# Patient Record
Sex: Female | Born: 1975 | Race: White | Hispanic: No | Smoking: Current every day smoker
Health system: Southern US, Community
[De-identification: ages and names within clinical notes are randomized; demographics above are authoritative.]

## PROBLEM LIST (undated history)

## (undated) DIAGNOSIS — F419 Anxiety disorder, unspecified: Secondary | ICD-10-CM

## (undated) DIAGNOSIS — G8929 Other chronic pain: Secondary | ICD-10-CM

## (undated) DIAGNOSIS — F17200 Nicotine dependence, unspecified, uncomplicated: Secondary | ICD-10-CM

## (undated) DIAGNOSIS — I10 Essential (primary) hypertension: Secondary | ICD-10-CM

## (undated) DIAGNOSIS — F319 Bipolar disorder, unspecified: Secondary | ICD-10-CM

## (undated) DIAGNOSIS — M549 Dorsalgia, unspecified: Secondary | ICD-10-CM

## (undated) DIAGNOSIS — M1990 Unspecified osteoarthritis, unspecified site: Secondary | ICD-10-CM

## (undated) DIAGNOSIS — M545 Low back pain, unspecified: Secondary | ICD-10-CM

## (undated) DIAGNOSIS — G4730 Sleep apnea, unspecified: Secondary | ICD-10-CM

## (undated) DIAGNOSIS — I517 Cardiomegaly: Secondary | ICD-10-CM

## (undated) DIAGNOSIS — J449 Chronic obstructive pulmonary disease, unspecified: Secondary | ICD-10-CM

## (undated) DIAGNOSIS — M797 Fibromyalgia: Secondary | ICD-10-CM

## (undated) DIAGNOSIS — E785 Hyperlipidemia, unspecified: Secondary | ICD-10-CM

## (undated) HISTORY — DX: Low back pain: M54.5

## (undated) HISTORY — DX: Nicotine dependence, unspecified, uncomplicated: F17.200

## (undated) HISTORY — DX: Low back pain, unspecified: M54.50

## (undated) HISTORY — DX: Bipolar disorder, unspecified: F31.9

## (undated) HISTORY — DX: Fibromyalgia: M79.7

## (undated) HISTORY — DX: Essential (primary) hypertension: I10

## (undated) HISTORY — DX: Cardiomegaly: I51.7

## (undated) HISTORY — PX: CARPAL TUNNEL RELEASE: SHX101

## (undated) HISTORY — DX: Anxiety disorder, unspecified: F41.9

## (undated) HISTORY — PX: TUBAL LIGATION: SHX77

## (undated) HISTORY — PX: NOSE SURGERY: SHX723

## (undated) HISTORY — PX: OTHER SURGICAL HISTORY: SHX169

## (undated) HISTORY — DX: Hyperlipidemia, unspecified: E78.5

---

## 1998-08-12 ENCOUNTER — Emergency Department (HOSPITAL_COMMUNITY): Admission: EM | Admit: 1998-08-12 | Discharge: 1998-08-12 | Payer: Self-pay | Admitting: Emergency Medicine

## 1999-03-05 ENCOUNTER — Other Ambulatory Visit: Admission: RE | Admit: 1999-03-05 | Discharge: 1999-03-05 | Payer: Self-pay | Admitting: Obstetrics and Gynecology

## 1999-04-26 ENCOUNTER — Ambulatory Visit (HOSPITAL_COMMUNITY): Admission: RE | Admit: 1999-04-26 | Discharge: 1999-04-26 | Payer: Self-pay | Admitting: Obstetrics and Gynecology

## 1999-04-26 ENCOUNTER — Encounter: Payer: Self-pay | Admitting: Obstetrics and Gynecology

## 1999-05-22 ENCOUNTER — Emergency Department (HOSPITAL_COMMUNITY): Admission: EM | Admit: 1999-05-22 | Discharge: 1999-05-22 | Payer: Self-pay | Admitting: Emergency Medicine

## 1999-05-27 ENCOUNTER — Ambulatory Visit (HOSPITAL_COMMUNITY): Admission: RE | Admit: 1999-05-27 | Discharge: 1999-05-27 | Payer: Self-pay | Admitting: Obstetrics and Gynecology

## 1999-06-28 ENCOUNTER — Inpatient Hospital Stay (HOSPITAL_COMMUNITY): Admission: AD | Admit: 1999-06-28 | Discharge: 1999-06-28 | Payer: Self-pay | Admitting: Obstetrics and Gynecology

## 1999-07-18 ENCOUNTER — Inpatient Hospital Stay (HOSPITAL_COMMUNITY): Admission: AD | Admit: 1999-07-18 | Discharge: 1999-07-18 | Payer: Self-pay | Admitting: Obstetrics and Gynecology

## 1999-08-31 ENCOUNTER — Inpatient Hospital Stay (HOSPITAL_COMMUNITY): Admission: AD | Admit: 1999-08-31 | Discharge: 1999-08-31 | Payer: Self-pay | Admitting: Obstetrics and Gynecology

## 1999-09-06 ENCOUNTER — Encounter: Payer: Self-pay | Admitting: Obstetrics and Gynecology

## 1999-09-06 ENCOUNTER — Ambulatory Visit (HOSPITAL_COMMUNITY): Admission: RE | Admit: 1999-09-06 | Discharge: 1999-09-06 | Payer: Self-pay | Admitting: Obstetrics and Gynecology

## 1999-09-14 ENCOUNTER — Inpatient Hospital Stay (HOSPITAL_COMMUNITY): Admission: AD | Admit: 1999-09-14 | Discharge: 1999-09-14 | Payer: Self-pay | Admitting: Obstetrics and Gynecology

## 1999-09-23 ENCOUNTER — Inpatient Hospital Stay (HOSPITAL_COMMUNITY): Admission: AD | Admit: 1999-09-23 | Discharge: 1999-09-26 | Payer: Self-pay | Admitting: Obstetrics and Gynecology

## 1999-12-23 ENCOUNTER — Encounter: Payer: Self-pay | Admitting: Emergency Medicine

## 1999-12-23 ENCOUNTER — Emergency Department (HOSPITAL_COMMUNITY): Admission: EM | Admit: 1999-12-23 | Discharge: 1999-12-23 | Payer: Self-pay | Admitting: Emergency Medicine

## 2000-01-01 ENCOUNTER — Emergency Department (HOSPITAL_COMMUNITY): Admission: EM | Admit: 2000-01-01 | Discharge: 2000-01-01 | Payer: Self-pay

## 2000-01-21 ENCOUNTER — Emergency Department (HOSPITAL_COMMUNITY): Admission: EM | Admit: 2000-01-21 | Discharge: 2000-01-21 | Payer: Self-pay | Admitting: Emergency Medicine

## 2000-01-22 ENCOUNTER — Emergency Department (HOSPITAL_COMMUNITY): Admission: EM | Admit: 2000-01-22 | Discharge: 2000-01-23 | Payer: Self-pay | Admitting: Emergency Medicine

## 2000-06-06 ENCOUNTER — Emergency Department (HOSPITAL_COMMUNITY): Admission: EM | Admit: 2000-06-06 | Discharge: 2000-06-06 | Payer: Self-pay | Admitting: Emergency Medicine

## 2000-06-18 ENCOUNTER — Emergency Department (HOSPITAL_COMMUNITY): Admission: EM | Admit: 2000-06-18 | Discharge: 2000-06-18 | Payer: Self-pay | Admitting: Emergency Medicine

## 2000-07-18 ENCOUNTER — Emergency Department (HOSPITAL_COMMUNITY): Admission: EM | Admit: 2000-07-18 | Discharge: 2000-07-18 | Payer: Self-pay | Admitting: Emergency Medicine

## 2000-08-31 ENCOUNTER — Emergency Department (HOSPITAL_COMMUNITY): Admission: EM | Admit: 2000-08-31 | Discharge: 2000-08-31 | Payer: Self-pay | Admitting: *Deleted

## 2000-10-24 ENCOUNTER — Emergency Department (HOSPITAL_COMMUNITY): Admission: EM | Admit: 2000-10-24 | Discharge: 2000-10-24 | Payer: Self-pay

## 2000-11-25 ENCOUNTER — Emergency Department (HOSPITAL_COMMUNITY): Admission: EM | Admit: 2000-11-25 | Discharge: 2000-11-25 | Payer: Self-pay | Admitting: *Deleted

## 2001-02-03 ENCOUNTER — Inpatient Hospital Stay (HOSPITAL_COMMUNITY): Admission: AD | Admit: 2001-02-03 | Discharge: 2001-02-03 | Payer: Self-pay | Admitting: *Deleted

## 2001-05-01 ENCOUNTER — Inpatient Hospital Stay (HOSPITAL_COMMUNITY): Admission: AD | Admit: 2001-05-01 | Discharge: 2001-05-01 | Payer: Self-pay | Admitting: Obstetrics and Gynecology

## 2001-06-12 ENCOUNTER — Emergency Department (HOSPITAL_COMMUNITY): Admission: EM | Admit: 2001-06-12 | Discharge: 2001-06-12 | Payer: Self-pay | Admitting: Emergency Medicine

## 2001-06-28 ENCOUNTER — Other Ambulatory Visit: Admission: RE | Admit: 2001-06-28 | Discharge: 2001-06-28 | Payer: Self-pay | Admitting: Obstetrics and Gynecology

## 2001-07-11 ENCOUNTER — Emergency Department (HOSPITAL_COMMUNITY): Admission: EM | Admit: 2001-07-11 | Discharge: 2001-07-12 | Payer: Self-pay | Admitting: *Deleted

## 2001-07-30 ENCOUNTER — Ambulatory Visit (HOSPITAL_COMMUNITY): Admission: RE | Admit: 2001-07-30 | Discharge: 2001-07-30 | Payer: Self-pay | Admitting: Obstetrics and Gynecology

## 2001-07-30 ENCOUNTER — Encounter: Payer: Self-pay | Admitting: Obstetrics and Gynecology

## 2001-10-08 ENCOUNTER — Inpatient Hospital Stay (HOSPITAL_COMMUNITY): Admission: AD | Admit: 2001-10-08 | Discharge: 2001-10-08 | Payer: Self-pay | Admitting: Obstetrics and Gynecology

## 2001-12-01 ENCOUNTER — Inpatient Hospital Stay (HOSPITAL_COMMUNITY): Admission: AD | Admit: 2001-12-01 | Discharge: 2001-12-01 | Payer: Self-pay | Admitting: Obstetrics and Gynecology

## 2001-12-19 ENCOUNTER — Encounter (INDEPENDENT_AMBULATORY_CARE_PROVIDER_SITE_OTHER): Payer: Self-pay

## 2001-12-19 ENCOUNTER — Inpatient Hospital Stay (HOSPITAL_COMMUNITY): Admission: AD | Admit: 2001-12-19 | Discharge: 2001-12-21 | Payer: Self-pay | Admitting: Obstetrics and Gynecology

## 2002-03-26 ENCOUNTER — Ambulatory Visit (HOSPITAL_BASED_OUTPATIENT_CLINIC_OR_DEPARTMENT_OTHER): Admission: RE | Admit: 2002-03-26 | Discharge: 2002-03-26 | Payer: Self-pay | Admitting: Family Medicine

## 2002-04-26 ENCOUNTER — Encounter: Admission: RE | Admit: 2002-04-26 | Discharge: 2002-04-26 | Payer: Self-pay | Admitting: Family Medicine

## 2002-04-26 ENCOUNTER — Encounter: Payer: Self-pay | Admitting: Family Medicine

## 2003-04-02 ENCOUNTER — Emergency Department (HOSPITAL_COMMUNITY): Admission: EM | Admit: 2003-04-02 | Discharge: 2003-04-02 | Payer: Self-pay | Admitting: Emergency Medicine

## 2003-04-02 ENCOUNTER — Encounter: Payer: Self-pay | Admitting: Emergency Medicine

## 2003-06-30 ENCOUNTER — Emergency Department (HOSPITAL_COMMUNITY): Admission: EM | Admit: 2003-06-30 | Discharge: 2003-06-30 | Payer: Self-pay | Admitting: Emergency Medicine

## 2003-09-07 ENCOUNTER — Encounter: Payer: Self-pay | Admitting: Emergency Medicine

## 2003-09-07 ENCOUNTER — Emergency Department (HOSPITAL_COMMUNITY): Admission: EM | Admit: 2003-09-07 | Discharge: 2003-09-07 | Payer: Self-pay

## 2003-12-07 ENCOUNTER — Emergency Department (HOSPITAL_COMMUNITY): Admission: EM | Admit: 2003-12-07 | Discharge: 2003-12-07 | Payer: Self-pay | Admitting: Emergency Medicine

## 2004-10-31 ENCOUNTER — Emergency Department: Payer: Self-pay | Admitting: Unknown Physician Specialty

## 2004-11-16 ENCOUNTER — Emergency Department: Payer: Self-pay | Admitting: Emergency Medicine

## 2005-01-09 ENCOUNTER — Emergency Department (HOSPITAL_COMMUNITY): Admission: EM | Admit: 2005-01-09 | Discharge: 2005-01-10 | Payer: Self-pay | Admitting: Emergency Medicine

## 2005-02-22 ENCOUNTER — Inpatient Hospital Stay (HOSPITAL_COMMUNITY): Admission: RE | Admit: 2005-02-22 | Discharge: 2005-02-26 | Payer: Self-pay | Admitting: Psychiatry

## 2005-02-22 ENCOUNTER — Ambulatory Visit: Payer: Self-pay | Admitting: Psychiatry

## 2005-04-12 ENCOUNTER — Emergency Department: Payer: Self-pay | Admitting: Emergency Medicine

## 2005-05-11 ENCOUNTER — Emergency Department: Payer: Self-pay | Admitting: Emergency Medicine

## 2005-06-01 ENCOUNTER — Ambulatory Visit: Payer: Self-pay | Admitting: Otolaryngology

## 2005-07-15 ENCOUNTER — Emergency Department: Payer: Self-pay | Admitting: General Practice

## 2005-12-17 ENCOUNTER — Emergency Department (HOSPITAL_COMMUNITY): Admission: EM | Admit: 2005-12-17 | Discharge: 2005-12-17 | Payer: Self-pay | Admitting: Emergency Medicine

## 2006-01-23 ENCOUNTER — Emergency Department (HOSPITAL_COMMUNITY): Admission: EM | Admit: 2006-01-23 | Discharge: 2006-01-23 | Payer: Self-pay | Admitting: Family Medicine

## 2006-01-24 ENCOUNTER — Ambulatory Visit: Payer: Self-pay | Admitting: Family Medicine

## 2006-02-01 ENCOUNTER — Ambulatory Visit: Payer: Self-pay | Admitting: Family Medicine

## 2006-03-13 ENCOUNTER — Emergency Department (HOSPITAL_COMMUNITY): Admission: EM | Admit: 2006-03-13 | Discharge: 2006-03-13 | Payer: Self-pay | Admitting: Emergency Medicine

## 2006-04-10 ENCOUNTER — Emergency Department: Payer: Self-pay | Admitting: Emergency Medicine

## 2006-05-17 ENCOUNTER — Emergency Department (HOSPITAL_COMMUNITY): Admission: EM | Admit: 2006-05-17 | Discharge: 2006-05-17 | Payer: Self-pay | Admitting: Family Medicine

## 2006-07-17 ENCOUNTER — Inpatient Hospital Stay: Payer: Self-pay | Admitting: Psychiatry

## 2006-08-06 ENCOUNTER — Emergency Department (HOSPITAL_COMMUNITY): Admission: EM | Admit: 2006-08-06 | Discharge: 2006-08-07 | Payer: Self-pay | Admitting: Emergency Medicine

## 2006-08-25 IMAGING — CR DG ANKLE COMPLETE 3+V*L*
1 series · 5 of 5 positions shown · non-contrast
Comparison: none

REASON FOR EXAM: Injury
COMMENTS:

[Series 1: view not recorded · 0.17mm/px · 5 of 5 slices shown]
[im 1/5]
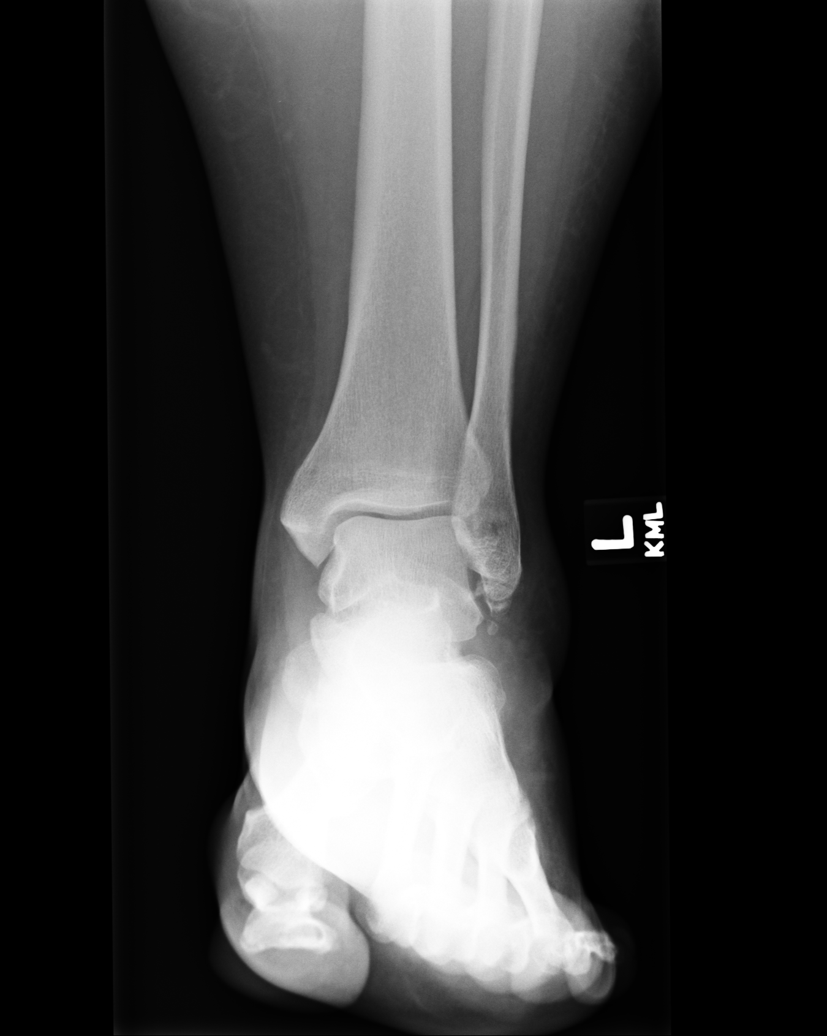
[im 2/5]
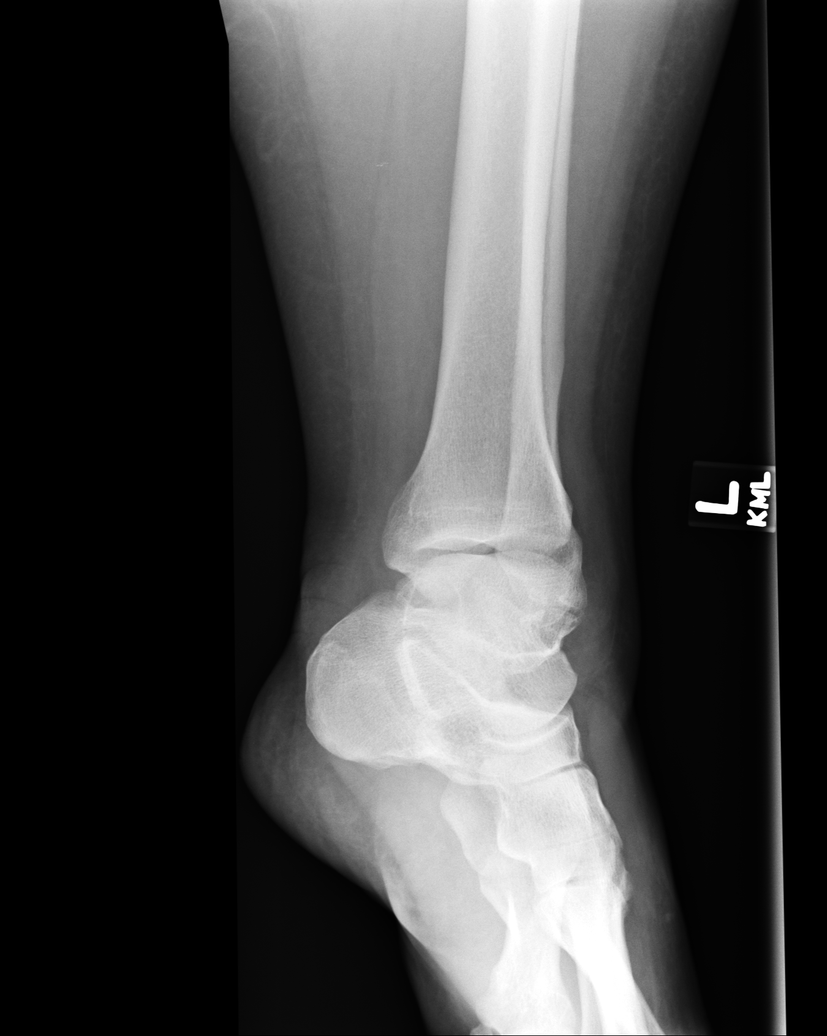
[im 3/5]
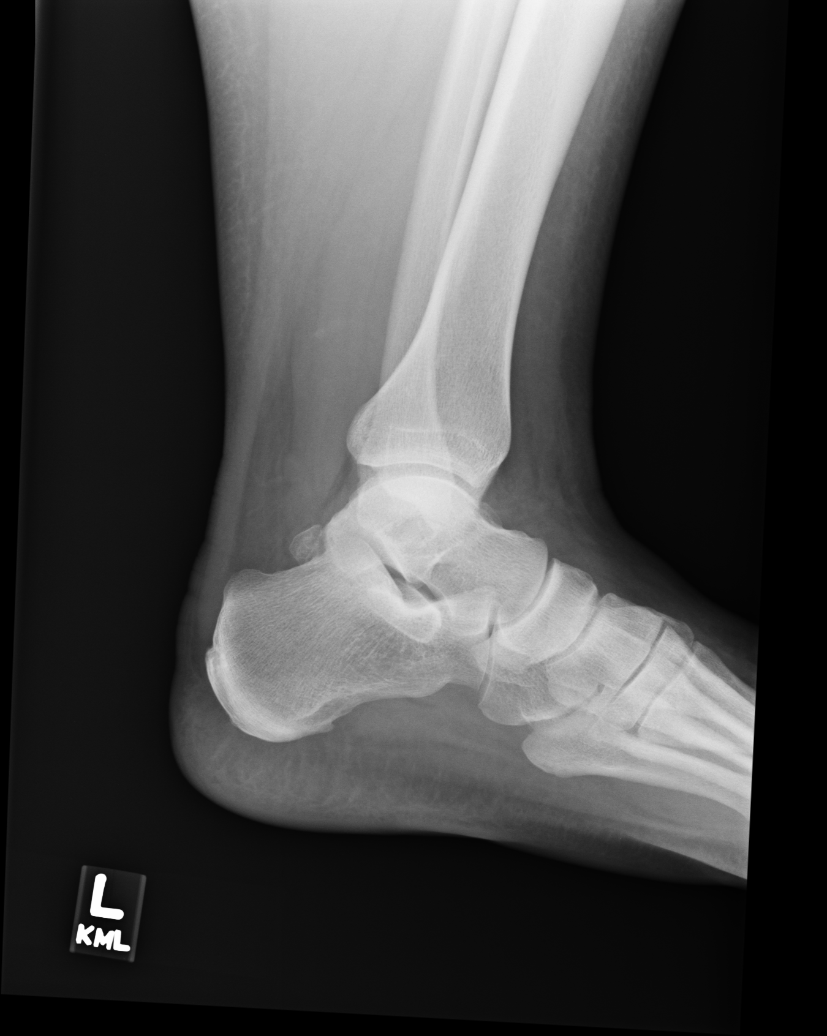
[im 4/5]
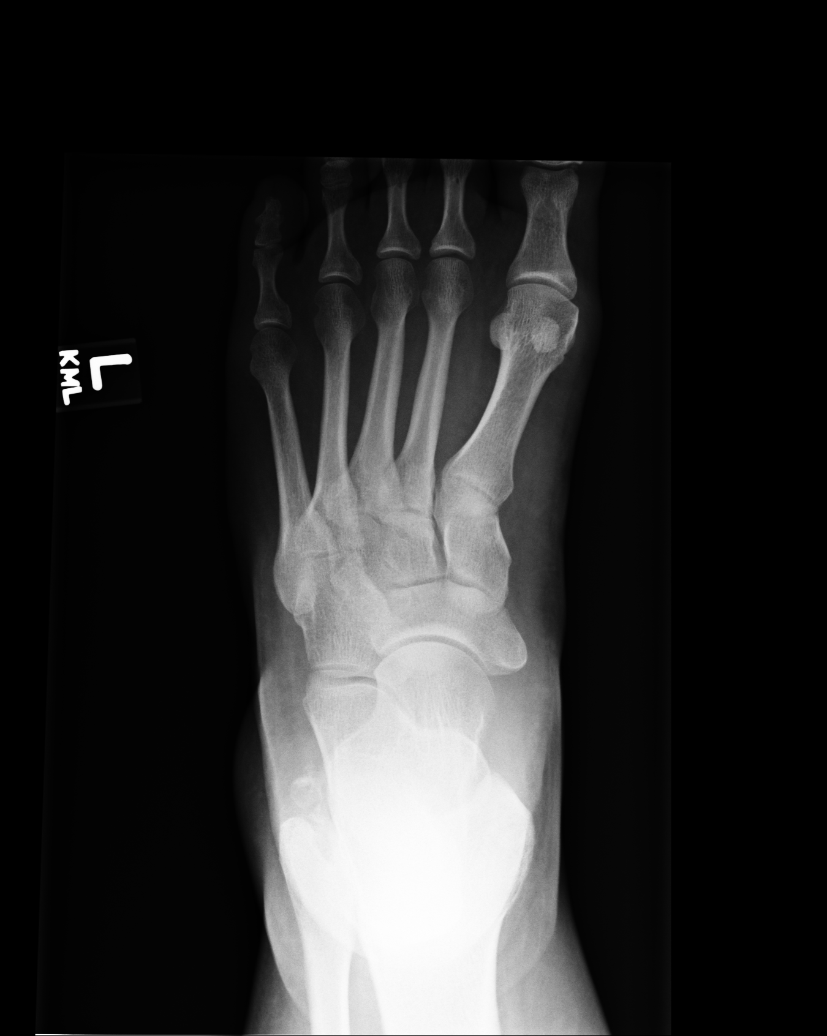
[im 5/5]
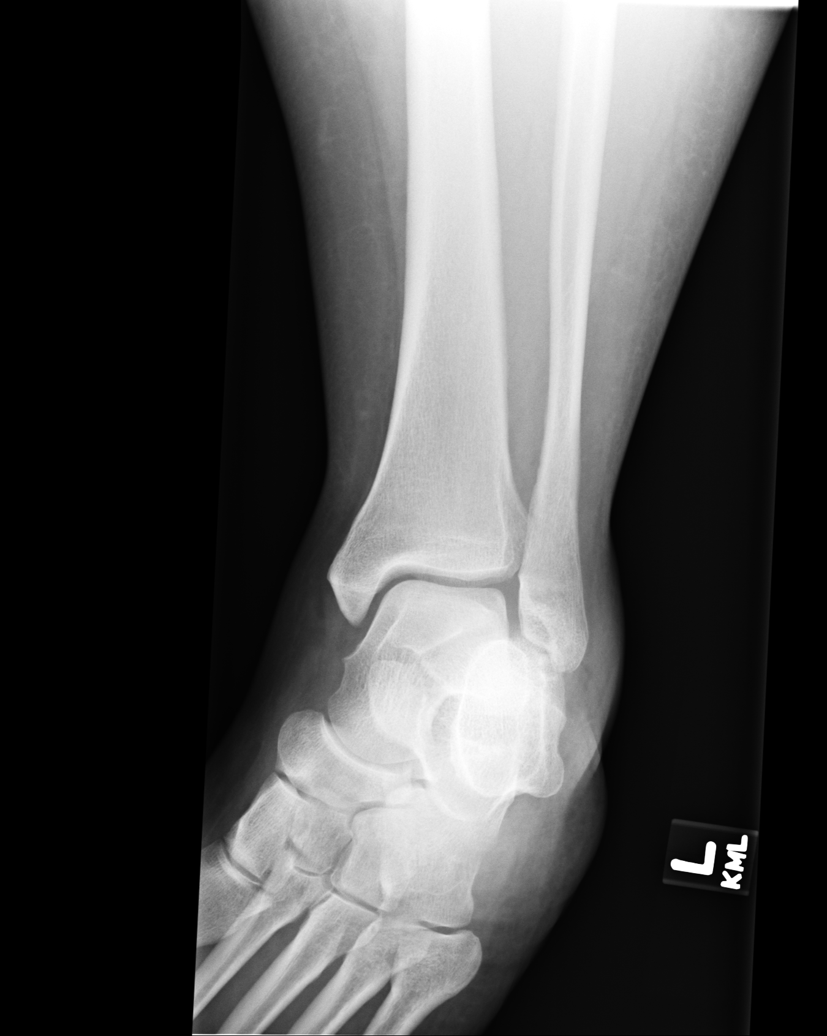

[5 of 5 positions shown; findings below may reference images not displayed]

PROCEDURE:     DXR - DXR ANKLE LEFT COMPLETE  - April 10, 2006  [DATE]

RESULT:     Five views of the LEFT ankle were obtained.

The current exam is compared to a prior exam of 07/15/2005.

There are noted osseous densities inferior to the tip of the lateral
malleolus compatible with sesamoid bones. These have been present
previously. No acute fracture is seen. The ankle mortise is well maintained.
IMPRESSION: No acute bony abnormalities are identified.

## 2007-04-08 ENCOUNTER — Emergency Department (HOSPITAL_COMMUNITY): Admission: EM | Admit: 2007-04-08 | Discharge: 2007-04-08 | Payer: Self-pay | Admitting: Emergency Medicine

## 2007-08-25 ENCOUNTER — Emergency Department (HOSPITAL_COMMUNITY): Admission: EM | Admit: 2007-08-25 | Discharge: 2007-08-25 | Payer: Self-pay | Admitting: Emergency Medicine

## 2007-08-30 ENCOUNTER — Emergency Department (HOSPITAL_COMMUNITY): Admission: EM | Admit: 2007-08-30 | Discharge: 2007-08-30 | Payer: Self-pay | Admitting: Emergency Medicine

## 2007-09-04 ENCOUNTER — Inpatient Hospital Stay (HOSPITAL_COMMUNITY): Admission: AD | Admit: 2007-09-04 | Discharge: 2007-09-06 | Payer: Self-pay | Admitting: Psychiatry

## 2007-09-04 ENCOUNTER — Emergency Department: Payer: Self-pay | Admitting: Unknown Physician Specialty

## 2007-09-05 ENCOUNTER — Ambulatory Visit: Payer: Self-pay | Admitting: *Deleted

## 2007-10-09 ENCOUNTER — Emergency Department (HOSPITAL_COMMUNITY): Admission: EM | Admit: 2007-10-09 | Discharge: 2007-10-09 | Payer: Self-pay | Admitting: Emergency Medicine

## 2008-06-03 ENCOUNTER — Emergency Department (HOSPITAL_COMMUNITY): Admission: EM | Admit: 2008-06-03 | Discharge: 2008-06-03 | Payer: Self-pay | Admitting: Family Medicine

## 2008-06-05 ENCOUNTER — Ambulatory Visit: Payer: Self-pay | Admitting: Orthopedic Surgery

## 2008-07-15 ENCOUNTER — Ambulatory Visit: Payer: Self-pay | Admitting: Orthopedic Surgery

## 2008-10-29 ENCOUNTER — Emergency Department (HOSPITAL_COMMUNITY): Admission: EM | Admit: 2008-10-29 | Discharge: 2008-10-29 | Payer: Self-pay | Admitting: Emergency Medicine

## 2009-04-09 ENCOUNTER — Ambulatory Visit (HOSPITAL_BASED_OUTPATIENT_CLINIC_OR_DEPARTMENT_OTHER): Admission: RE | Admit: 2009-04-09 | Discharge: 2009-04-09 | Payer: Self-pay | Admitting: Orthopedic Surgery

## 2009-06-13 ENCOUNTER — Other Ambulatory Visit: Payer: Self-pay | Admitting: Emergency Medicine

## 2009-06-13 ENCOUNTER — Inpatient Hospital Stay (HOSPITAL_COMMUNITY): Admission: AD | Admit: 2009-06-13 | Discharge: 2009-06-16 | Payer: Self-pay | Admitting: Psychiatry

## 2009-06-15 ENCOUNTER — Ambulatory Visit: Payer: Self-pay | Admitting: Psychiatry

## 2009-09-15 ENCOUNTER — Encounter: Admission: RE | Admit: 2009-09-15 | Discharge: 2009-10-07 | Payer: Self-pay | Admitting: Anesthesiology

## 2010-03-24 ENCOUNTER — Encounter: Payer: Self-pay | Admitting: Cardiovascular Disease

## 2010-07-07 ENCOUNTER — Encounter: Payer: Self-pay | Admitting: Cardiovascular Disease

## 2010-08-09 ENCOUNTER — Encounter: Payer: Self-pay | Admitting: Cardiovascular Disease

## 2010-08-09 ENCOUNTER — Ambulatory Visit: Payer: Self-pay | Admitting: Cardiology

## 2010-08-09 DIAGNOSIS — J449 Chronic obstructive pulmonary disease, unspecified: Secondary | ICD-10-CM | POA: Insufficient documentation

## 2010-08-09 DIAGNOSIS — Z8679 Personal history of other diseases of the circulatory system: Secondary | ICD-10-CM | POA: Insufficient documentation

## 2010-08-09 DIAGNOSIS — E785 Hyperlipidemia, unspecified: Secondary | ICD-10-CM | POA: Insufficient documentation

## 2010-08-09 DIAGNOSIS — R0602 Shortness of breath: Secondary | ICD-10-CM | POA: Insufficient documentation

## 2010-08-31 ENCOUNTER — Encounter: Payer: Self-pay | Admitting: Cardiology

## 2010-09-07 ENCOUNTER — Encounter: Payer: Self-pay | Admitting: Internal Medicine

## 2010-09-07 ENCOUNTER — Ambulatory Visit: Payer: Self-pay | Admitting: Internal Medicine

## 2010-09-07 DIAGNOSIS — R05 Cough: Secondary | ICD-10-CM

## 2010-09-07 DIAGNOSIS — F17200 Nicotine dependence, unspecified, uncomplicated: Secondary | ICD-10-CM | POA: Insufficient documentation

## 2010-09-07 DIAGNOSIS — G4730 Sleep apnea, unspecified: Secondary | ICD-10-CM | POA: Insufficient documentation

## 2010-09-07 DIAGNOSIS — R059 Cough, unspecified: Secondary | ICD-10-CM | POA: Insufficient documentation

## 2010-09-13 ENCOUNTER — Ambulatory Visit: Payer: Self-pay | Admitting: Cardiology

## 2010-09-13 ENCOUNTER — Ambulatory Visit: Payer: Self-pay

## 2010-09-13 ENCOUNTER — Ambulatory Visit (HOSPITAL_COMMUNITY): Admission: RE | Admit: 2010-09-13 | Discharge: 2010-09-13 | Payer: Self-pay | Admitting: Cardiology

## 2010-09-13 ENCOUNTER — Encounter: Payer: Self-pay | Admitting: Cardiology

## 2010-10-06 ENCOUNTER — Telehealth: Payer: Self-pay | Admitting: Internal Medicine

## 2010-10-20 ENCOUNTER — Telehealth: Payer: Self-pay | Admitting: Internal Medicine

## 2010-10-26 ENCOUNTER — Telehealth: Payer: Self-pay | Admitting: Internal Medicine

## 2011-01-11 NOTE — Progress Notes (Signed)
Summary: nos appt  Phone Note Call from Patient   Caller: [email or web address]  Call For: ramaswamy Summary of Call: ATC pt to rsc nos from 11/8, phone disconnected. Initial call taken by: Darletta Moll,  October 20, 2010 3:53 PM     Appended Document: nos appt send letter

## 2011-01-11 NOTE — Letter (Signed)
Summary: [place] [employer] [facility], agricultural at [address]   [place], [place] [address]   Phone: [phone]  Fax: [phone]      August 31, 2010 MRN: [id number]   Alyssa [name] [address] [place], [place]  [address]   Dear [name],  Your test ordered by Selena Batten has been reviewed by your physician (or physician assistant) and was found to be normal or stable. Your physician (or physician assistant) felt no changes were needed at this time.  ____ Echocardiogram  ____ Cardiac Stress Test  __x__ Lab Work  ____ Peripheral vascular study of arms, legs or neck  ____ CT scan or X-ray  ____ Lung or Breathing test  ____ Other:   Thank you.   Benedict Needy, RN    McCracken Bing, MD, F.A.C.Gaylord Shih, MD, F.A.C.C Lewayne Bunting, MD, F.A.C.C Nona Dell, MD, F.A.C.C Charlton Haws, MD, Lenise Arena.C.C

## 2011-01-11 NOTE — Letter (Signed)
Summary: PHI  PHI   Imported By: Harlon Flor 08/23/2010 15:55:41  _____________________________________________________________________  External Attachment:    Type:   Image     Comment:   External Document

## 2011-01-11 NOTE — Assessment & Plan Note (Signed)
Summary: asthma/apc   Visit Type:  Initial Consult Copy to:  Dr. Shirlee Latch Primary Provider/Referring Provider:  Chyrl Civatte Niemeyer,M.D.  CC:  Pulmonary COnsult. Marland Kitchen  History of Present Illness: IOV 09/07/2010: 35 year old obese female, 23 pack smoker, chronic back pain, OSA on CPAP since 2009 and Depression, Ex-coccaine - quit 10/03/2009  . Main c/o dyspnea. Insidious onset 3-4 years ago. PRogressive since onset. Currently, becomes dyspneic walking laps in office, or changing clothes,  taking a shower, climbing half flight of stairs makes her dyspneic. Needs to take breaks during housework. Dyspnea improved by resting. Dyspnea is associated with diaphoresis, tachycardic/palpitations. Dyspnea and back pain restricting her life and limiting ability to exercise and lose weight.   Associated cough wiht white, green-brown moderate amount of sputum present for 2-3 years. Cough is made worse with activity like house work. Cough also improved by resting and drinking water. Cough is also associated with occassional light headedness and near syncope. Cough is associated with ACE inhibitor intake for past 5months; she does note worsening of cough after starting ACE inhibitor. Cough also associad with GERD and post nasal drainage.   Also has random chest pains, sharp, with radiation to back in upper left sternal area. These are transient and fleeting. No specific aggravating or relieving factors   Per hx PMD diagnosed with "COPD" 3 years ago based on symptoms and cxr and spirometry per her hx. Started on spiriva, and advair and performist.  Also, diagnosed with OSA 2 years ago by PMD and started on CPAP. But these have not helped despite understanding aand compliance with meds. Perforomist as needed  use is atleast two times a day. Per Dr. Shirlee Latch cardioligy notes, ue to symptoms, also had ECHO in June/July 2011 and was noted to have moderate pericardial effusion although this result or study is not formally  available at this point.   Per [facility] records: ECG: NSR, normal. Labs (4/11): TSH normal, K 4.2, creatinine 0.7, LDL 102, HDL 37  Normal BNP 12 on 9/12    Preventive Screening-Counseling & Management  Alcohol-Tobacco     Smoking Status: current     Smoking Cessation Counseling: yes     Smoke Cessation Stage: ready     Packs/Day: 1.0     Year Started: 1988     Pack years: 23     Passive Smoke Exposure: yes     Tobacco Counseling: to quit use of tobacco products     Passive Smoke Counseling: to avoid passive smoke exposure  Caffeine-Diet-Exercise     Caffeine use/day: 1     Does Patient Exercise: no  Comments: mom smokes in the house. Has tried to quit with chnatix in 2010 but became more depressed. Currently due to start wellbutrin in Oct 2011 0035 Current Medications (verified): 1)  Advair Diskus 250-50 Mcg/dose Aepb (Fluticasone-Salmeterol) .... Two Sprays Two Times A Day 2)  Spiriva Handihaler 18 Mcg Caps (Tiotropium Bromide Monohydrate) .... One Tablet Once Daily 3)  Phentermine Hcl 37.5 Mg Tabs (Phentermine Hcl) .... One Tablet Once Daily 4)  Lisinopril-Hydrochlorothiazide 10-12.5 Mg Tabs (Lisinopril-Hydrochlorothiazide) .... One Tablet Once Daily 5)  Hydrocodone-Acetaminophen 10-325 Mg Tabs (Hydrocodone-Acetaminophen) .... Take 1 Tablet By Mouth Three Times A Day 6)  Naproxen 375 Mg Tabs (Naproxen) .... One Tablet At Bedtime 7)  Albuterol Sulfate (2.5 Mg/28ml) 0.083% Nebu (Albuterol Sulfate) .... Every Four Hours As Needed 8)  Nexium 40 Mg Cpdr (Esomeprazole Magnesium) .... One Tablet Once Daily 9)  Oxygen 2 Liters .... At Bedtime  10)  Cpap .Marland Kitchen.. 4 11)  Vitamin D3 5000 Unit Caps (Cholecalciferol) .... Take 1 Tablet By Mouth Once A Day 12)  Wellbutrin Xl 150 Mg Xr24h-Tab (Bupropion Hcl) .... Take 1 Tablet By Mouth Once A Day 13)  Cymbalta 60 Mg Cpep (Duloxetine Hcl) .... Take 1 Capsule By Mouth Once A Day  Allergies (verified): No Known Drug Allergies  Family  History: Family History of Coronary Artery Disease:  Mother with MI age 89 and valvular disease, emphysema Father with MI's first at age 75 and valvular disease., emphysema  Social History: Single, lives in [place] with boyfriend and 3 boys Homemaker Tobacco Use - started [id number], avg 1ppd.  Alcohol Use - no Regular Exercise - no Drug Use - used to use cocaine, last use in 10/10 Smoking Status:  current Packs/Day:  1.0 Pack years:  23 Passive Smoke Exposure:  yes Caffeine use/day:  1  Review of Systems       The patient complains of shortness of breath with activity, shortness of breath at rest, productive cough, coughing up blood, chest pain, irregular heartbeats, acid heartburn, indigestion, weight change, abdominal pain, difficulty swallowing, sore throat, tooth/dental problems, headaches, nasal congestion/difficulty breathing through nose, sneezing, itching, anxiety, depression, hand/feet swelling, joint stiffness or pain, rash, and change in color of mucus.  The patient denies non-productive cough, loss of appetite, ear ache, and fever.    Vital Signs:  Patient profile:   35 year old female Height:      67 inches Weight:      307.13 pounds BMI:     48.28 O2 Sat:      98 % on Room air Temp:     97.6 degrees F oral Pulse rate:   91 / minute BP sitting:   126 / 78  (right arm) Cuff size:   large  Vitals Entered By: Carron Curie CMA (September 07, 2010 9:23 AM)  O2 Flow:  Room air CC: Pulmonary COnsult.  Comments Medications reviewed with patient. Carron Curie CMA  September 07, 2010 9:32 AM Daytime phone number verified with patient.    Physical Exam  General:  Well developed, well nourished, in no acute distress.  Obese.  Head:  normocephalic and atraumatic Nose:  no deformity, discharge, inflammation, or lesions Mouth:  Teeth, gums and palate normal. Oral mucosa normal. Neck:  Neck supple, no JVD. No masses, thyromegaly or abnormal cervical  nodes. Lungs:  Bilateral expiratory wheezes no disgtress Heart:  Non-displaced PMI, chest non-tender; regular rate and rhythm, S1, S2 without murmurs, rubs or gallops. Carotid upstroke normal, no bruit. Pedals normal pulses. No edema, no varicosities. Abdomen:  Bowel sounds positive; abdomen soft and non-tender without masses, organomegaly, or hernias noted. No hepatosplenomegaly. Msk:  Back normal, normal gait. Muscle strength and tone normal. Extremities:  No clubbing or cyanosis. Neurologic:  Alert and oriented x 3.normal CN II-XII, gait normal, and strength normal.   Skin:  Intact without lesions or rashes. Psych:  Normal affect.   MISC. Report  Procedure date:  08/23/2010  Findings:      ECG: NSR, normal  Labs (4/11): TSH normal, K 4.2, creatinine 0.7, LDL 102, HDL 37  BNP 12 on 08/23/2010   Comments:      per [facility] records  Pulmonary Function Test Date: 09/07/2010 Gender: Female  Pre-Spirometry FEV1    Value: 2.3L L     % Pred: 67.6% %  Evaluation: moderate obstruction with NO significant bronchodilator response  Impression & Recommendations:  Problem #  1:  SHORTNESS OF BREATH (SOB) (ICD-786.05) Assessment New  Likely multifactorial due to COPD NOS (based on family hx, smoking and prior dx and med list), obesity, dconditioning +/- pericardial effusion.   PLAN Get OLD Pfts from PMD Await completeion of echo by Dr. Shirlee Latch Based on above, decide on therapy alteration or further testing Alpha 1 AT check at followup  Orders: Consultation Level V [phone]) Prescription Created Electronically [phone])  Problem # 2:  COUGH (ICD-786.2) Assessment: New  Again multifactoria. She has active wheeze in office. Spirometry in office with her actively wheezing shows Fev1 2.3L/68% while on spiriva and advair. She is having mild AECOPD in need of putpatint Rx. .Other  Etiologies for chronic cough are smoking, copd, ACE inhibitor use, post nasal drip and GERD. She has all  causes of chronic cough.   PLAN CHANGE ACE inhibitor to ARB Continue PPI for GERD GERD dietary booklet given USe netti pot saline wash - pamphlet given Continue spiriva and advair for now Take steroid taper and antibiotic ROV in 4 weeks with pft result from outside to be made available  Orders: Consultation Level V (09811) Prescription Created Electronically [phone])  Problem # 3:  TOBACCO ABUSE (ICD-305.1) Assessment: Unchanged  failed chantix in past. Going to try wellbutrin that Dr. Shirlee Latch prescribed. Counselled for 5 minutes on hazards of smoking. She understands and will try to quit  Orders: Consultation Level V [phone]) Tobacco use cessation intermediate 3-10 minutes (13086)  Problem # 4:  SLEEP APNEA (ICD-780.57) Assessment: Comment Only  will refer to our sleep docs at some point in future followup  Orders: Consultation Level V (57846)  Medications Added to Medication List This Visit: 1)  Hydrocodone-acetaminophen 10-325 Mg Tabs (Hydrocodone-acetaminophen) .... Take 1 tablet by mouth three times a day 2)  Cpap  .Marland Kitchen.. 4 3)  Vitamin D3 5000 Unit Caps (Cholecalciferol) .... Take 1 tablet by mouth once a day 4)  Cymbalta 60 Mg Cpep (Duloxetine hcl) .... Take 1 capsule by mouth once a day 5)  Benicar Hct 20-12.5 Mg Tabs (Olmesartan medoxomil-hctz) .... One tablet by mouth daily 6)  Prednisone 10 Mg Tabs (Prednisone) .... 4 tablets daily x3 days, then 3 tablets daily x 3 days, then 2 tablets daily x3 days, then 1 tablet daily x3 days, then stop 7)  Doxycycline Monohydrate 100 Mg Caps (Doxycycline monohydrate) .... By mouth twice daily after meals  Patient Instructions: 1)  #COUGH 2)   - you are in copd attack now 3)   - take antibiotic and prednisone as directed 4)   - use netti pot saline wash daily - get pamphlet 5)   - control acid reflux - get dietary advice sheet  6)  - stop ace inhibitor 7)   - take sample of benicar-hctz 1 tablet daily 8)   - measure your bp  regularly at a mall- if upper reading >150 call us 9)  #SHORTNESS OF BREATH 10)   - get all your old breathing test from Dr Vance Peper office 11)   -sign release for it 12)   - we will wait for echo result from Dr. Shirlee Latch 13)  #OLD MEDS 14)   - continue your other medications as before 15)   - just the lisinopril you are changing to the sample benicar 16)  #FOLLOWUP 17)   - flu shot next visit 18)   - alpha 1 antitrypsin chek next visit 19)   - spirometrey repeat next visit 20)   - return in 4  weeks Prescriptions: DOXYCYCLINE MONOHYDRATE 100 MG  CAPS (DOXYCYCLINE MONOHYDRATE) By mouth twice daily after meals  #10 x 0   Entered and Authorized by:   Kalman Shan MD   Signed by:   Kalman Shan MD on 09/07/2010   Method used:   Electron[facility] to        [facility] (retail)       [name]       [place], [place]  [address]       Ph: [phone] or [phone]       Fax: [phone]   RxID:   [id number] PREDNISONE 10 MG  TABS (PREDNISONE) 4 tablets daily x3 days, then 3 tablets daily x 3 days, then 2 tablets daily x3 days, then 1 tablet daily x3 days, then stop  #30 x 0   Entered and Authorized by:   Kalman Shan MD   Signed by:   Kalman Shan MD on 09/07/2010   Method used:   [facility]        [id number]       [address]       [place], [place]  [address]       Ph: [phone] or [phone]       Fax: [phone]   RxID:   [id number]    Immunization History:  Influenza Immunization History:    Influenza:  fluvax 3+ (08/17/2010)  Pneumovax Immunization History:    Pneumovax:  never (08/12/2010)   Appended Document: asthma/apc Ambulatory Pulse Oximetry  Resting; HR_104____    02 Sat_96____  Lap1 (185 feet)   HR_136____   02 Sat___94__ Lap2 (185 feet)   HR_136____   02 Sat__87___  on 2nd lap pt sats dropped but they recovered immediately without oxygen  Lap3 (185 feet)   HR___141__   02  Sat_94____  _x__Test Completed without Difficulty ___Test Stopped due to:

## 2011-01-11 NOTE — Progress Notes (Signed)
Summary: letter mailed  Phone Note Call from Patient   Caller: [email or web address]  Call For: ramaswamy Summary of Call: Letter sent. Initial call taken by: Darletta Moll,  October 26, 2010 8:41 AM

## 2011-01-11 NOTE — Progress Notes (Signed)
Summary: nos appt  Phone Note Call from Patient   Caller: [email or web address]  Call For: [email or web address] Summary of Call: Rsc nos from 10/25 to 11/8. Initial call taken by: Darletta Moll,  October 06, 2010 9:57 AM

## 2011-01-11 NOTE — Assessment & Plan Note (Signed)
Summary: NEW PT   Visit Type:  Initial Consult Primary Provider:  Meindert Niemeyer,M.D.  CC:  c/o rapid heart beats and shortness of breath.  Echo(05/2010) revealed  EF of 52% with mild LVH and mild MR.Marland Kitchen  History of Present Illness: 35 yo with history of asthma, exertional dyspnea, and pericardial effusion seen on recent echocardiogram presents for cardiac evaluation.  Patient has had significant exertional shortness of breath for the last 3 years.  She is dyspneic just walking around her house.  It is very hard for her to climb stairs.  She wheezes frequently.  She is an active smoker but is down to 1/2 ppd now (prior 1.5 ppd).  She gets chest tightness when she wheezes.  No orthopnea or PND.  She had an echocardiogram at Dr. Carron Brazen office in 7/11 showing normal LV systolic function with no significant valvular dysfunction.  A moderate pericardial effusion was noted .  Finally, she had an episode several weeks ago of numbness/tingling on the left side of her body that lasted for more than a day.  She had carotid dopplers done after this that apparently were unremarkable.   ECG: NSR, normal  Labs (4/11): TSH normal, K 4.2, creatinine 0.7, LDL 102, HDL 37  Preventive Screening-Counseling & Management  Alcohol-Tobacco     Smoking Status: never  Caffeine-Diet-Exercise     Does Patient Exercise: no      Drug Use:  no.    Current Medications (verified): 1)  Advair Diskus 250-50 Mcg/dose Aepb (Fluticasone-Salmeterol) .... Two Sprays Two Times A Day 2)  Spiriva Handihaler 18 Mcg Caps (Tiotropium Bromide Monohydrate) .... One Tablet Once Daily 3)  Phentermine Hcl 37.5 Mg Tabs (Phentermine Hcl) .... One Tablet Once Daily 4)  Lisinopril-Hydrochlorothiazide 10-12.5 Mg Tabs (Lisinopril-Hydrochlorothiazide) .... One Tablet Once Daily 5)  Hydrocodone-Acetaminophen 5-325 Mg Tabs (Hydrocodone-Acetaminophen) .... One Tablet Three Times A Day 6)  Naproxen 375 Mg Tabs (Naproxen) .... One Tablet At  Bedtime 7)  Albuterol Sulfate (2.5 Mg/75ml) 0.083% Nebu (Albuterol Sulfate) .... Every Four Hours As Needed 8)  Nexium 40 Mg Cpdr (Esomeprazole Magnesium) .... One Tablet Once Daily 9)  Oxygen 2 Liters .... At Bedtime 10)  Cpap 11)  Vitamin D3 5000 .... One Tablet Once Daily  Allergies (verified): No Known Drug Allergies  Past History:  Family History: Last updated: 08/09/2010 Family History of Coronary Artery Disease:  Mother with MI age 28 and valvular disease Father with MI's first at age 71 and valvular disease.  Social History: Last updated: 08/09/2010 Single, lives in [place] Tobacco Use - Smokes 0.5 ppd (heavier prior) Alcohol Use - no Regular Exercise - no Drug Use - used to use cocaine, last use in 6/10  Risk Factors: Exercise: no (08/09/2010)  Risk Factors: Smoking Status: never (08/09/2010)  Past Medical History: 1.  Echo (7/11): EF 70%, mild LVH, no significant valvular disease, moderate pericardial effusion.  2.  Asthma 3.  anxiety 4.  fibromyalgia 5.  Bipolar disorder 6.  Hypertension 7.  Hyperlipidemia 8.  osteoporosis 9.  Low back pain: two ruptured disc in lower back 10.  Active smoker 11.  Prior cocaine abuse  Past Surgical History: C-section x 3 Bilateral hand surgery Tubligation  Family History: Family History of Coronary Artery Disease:  Mother with MI age 94 and valvular disease Father with MI's first at age 70 and valvular disease.  Social History: Single, lives in [place] Tobacco Use - Smokes 0.5 ppd (heavier prior) Alcohol Use - no Regular Exercise -  no Drug Use - used to use cocaine, last use in 6/10 Smoking Status:  never Does Patient Exercise:  no Drug Use:  no  Review of Systems       All systems reviewed and negative except as per HPI.   Vital Signs:  Patient profile:   35 year old female Height:      67 inches Weight:      297 pounds BMI:     46.68 Pulse rate:   94 / minute BP sitting:   142 / 87  (left  arm) Cuff size:   large  Vitals Entered By: Bishop Dublin, CMA (August 09, 2010 3:34 PM)  Physical Exam  General:  Well developed, well nourished, in no acute distress.  Obese.  Head:  normocephalic and atraumatic Nose:  no deformity, discharge, inflammation, or lesions Mouth:  Teeth, gums and palate normal. Oral mucosa normal. Neck:  Neck supple, no JVD. No masses, thyromegaly or abnormal cervical nodes. Lungs:  Bilateral expiratory wheezes Heart:  Non-displaced PMI, chest non-tender; regular rate and rhythm, S1, S2 without murmurs, rubs or gallops. Carotid upstroke normal, no bruit. Pedals normal pulses. No edema, no varicosities. Abdomen:  Bowel sounds positive; abdomen soft and non-tender without masses, organomegaly, or hernias noted. No hepatosplenomegaly. Msk:  Back normal, normal gait. Muscle strength and tone normal. Extremities:  No clubbing or cyanosis. Neurologic:  Alert and oriented x 3. Skin:  Intact without lesions or rashes. Psych:  Normal affect.   Impression & Recommendations:  Problem # 1:  SHORTNESS OF BREATH (ICD-786.05) Patient is actively wheezing today.  She says that she wheezes frequently and carries a diagnosis of asthma.  She has quite significant exertional dyspnea.  She uses Advair and Spiriva, with oxygen at night.  I suspect that her symptoms are mostly pulmonary.  She is not volume overloaded on exam.  I will get a BNP and a repeat echo to reassess the pericardial effusion seen on the previous echo.  She needs to quit smoking.  I think that she would benefit from pulmonary evaluation and will refer her to see a pulmonologist to aid with asthma management.    Problem # 2:  PERICARDIAL EFFUSION A "moderate" pericardial effusion was noted on echo report from 7/11.  I do not have the actual images to review.  She does not have any condition associated with the development of a significant pericardial effusion.  I am going to repeat an echo to reassess, as  above.   Problem # 3:  SMOKING I strongly encouraged the patient to stop smoking.  Chantix made her depression worse.  I will given her a wellbutrin prescription.    Problem # 4:  HYPERTENSION (ICD-401.9) BP borderline elevated.  Continue current med for now.   Other Orders: EKG w/ Interpretation (93000) T-BNP  (B Natriuretic Peptide) (04540-98119) Echocardiogram (Echo) Pulmonary Referral (Pulmonary)  Patient Instructions: 1)  Your physician recommends that you schedule a follow-up appointment in: 1 month  2)  Your physician recommends that you have lab work today (BNP) 3)  Your physician has recommended you make the following change in your medication: START wellbutrin 150mg  two times a day for 3 days then only once per day.  4)  You have been referred to [facility] Pulmonology appointment Monday Sept 12 @ 3:30 5)  Your physician has requested that you have an echocardiogram.  Echocardiography is a painless test that uses sound waves to create images of your heart. It provides your doctor with  information about the size and shape of your heart and how well your heart's chambers and valves are working.  This procedure takes approximately one hour. There are no restrictions for this procedure. Prescriptions: WELLBUTRIN XL 150 MG XR24H-TAB (BUPROPION HCL) Take 1 tablet by mouth once a day  #30 x 4   Entered by:   Benedict Needy, RN   Authorized by:   Marca Ancona, MD   Signed by:   Benedict Needy, RN on 08/09/2010   Method used:   [id number]       [address]       [place], [place]  [address]       Ph: [phone] or [phone]       Fax: [phone]   RxID:   [id number]   Appended Document: Orders Update    Clinical Lists Changes  Orders: Added new Test order of TLB-BNP (B-Natriuretic Peptide) (83880-BNPR) - Signed

## 2011-01-11 NOTE — Assessment & Plan Note (Signed)
Summary: f16m per check out/lg   Visit Type:  1 month follow up Referring Provider:  Dr. Shirlee Latch Primary Provider:  Meindert Niemeyer,M.D.  CC:  Rapid heart beat.  History of Present Illness: 35 yo with history of asthma, exertional dyspnea, and pericardial effusion seen on recent echocardiogram presents for cardiac evaluation.  Patient has had significant exertional shortness of breath for the last 3 years.  She is dyspneic walking around her house at times.  It is very hard for her to climb stairs.  She wheezes frequently.  She is an active smoker but is down to 1/2 ppd now (prior 1.5 ppd).  She gets chest tightness when she wheezes.  No orthopnea or PND.  She had an echocardiogram at Dr. Carron Brazen office in 7/11 showing normal LV systolic function with no significant valvular dysfunction.  A moderate pericardial effusion was noted .    Echo to reassess for pericardial effusion showed no effusion at all.  EF was normal, RV appeared normal, there was no pulmonary HTN.  She has been seeing Dr. Marchelle Gearing for asthma management.  Her cough has resolved off ACEI.  She still is having wheezing fairly frequently.   ECG: NSR, normal, HR 51  Labs (4/11): TSH normal, K 4.2, creatinine 0.7, LDL 102, HDL 37  Current Medications (verified): 1)  Advair Diskus 250-50 Mcg/dose Aepb (Fluticasone-Salmeterol) .... Two Sprays Two Times A Day 2)  Spiriva Handihaler 18 Mcg Caps (Tiotropium Bromide Monohydrate) .... One Tablet Once Daily 3)  Hydrocodone-Acetaminophen 10-325 Mg Tabs (Hydrocodone-Acetaminophen) .... Take 1 Tablet By Mouth Three Times A Day 4)  Albuterol Sulfate (2.5 Mg/88ml) 0.083% Nebu (Albuterol Sulfate) .... Every Four Hours As Needed 5)  Nexium 40 Mg Cpdr (Esomeprazole Magnesium) .... One Tablet Once Daily 6)  Oxygen 2 Liters .... At Bedtime 7)  Cpap .Marland Kitchen.. 4 8)  Vitamin D3 5000 Unit Caps (Cholecalciferol) .... Take 1 Tablet By Mouth Once A Day 9)  Wellbutrin Xl 150 Mg Xr24h-Tab (Bupropion Hcl)  .... Take 1 Tablet By Mouth Once A Day 10)  Cymbalta 60 Mg Cpep (Duloxetine Hcl) .... Take 1 Capsule By Mouth Once A Day 11)  Benicar Hct 20-12.5 Mg  Tabs (Olmesartan Medoxomil-Hctz) .... One Tablet By Mouth Daily 12)  Prednisone 10 Mg  Tabs (Prednisone) .... 4 Tablets Daily X3 Days, Then 3 Tablets Daily X 3 Days, Then 2 Tablets Daily X3 Days, Then 1 Tablet Daily X3 Days, Then Stop  Allergies (verified): No Known Drug Allergies  Past History:  Past Medical History: 1.  Echo (7/11) at outside office showed EF 70%, mild LVH, no significant valvular disease, moderate pericardial effusion.  Echo (9/11) at our office showed no pericardial effusion, EF 65%, mild MR, normal RV size and systolic function.  2.  Asthma 3.  anxiety 4.  fibromyalgia 5.  Bipolar disorder 6.  Hypertension: ACEI cough 7.  Hyperlipidemia 8.  osteoporosis 9.  Low back pain: two ruptured disc in lower back 10.  Active smoker 11.  Prior cocaine abuse  Family History: Reviewed history from 09/07/2010 and no changes required. Family History of Coronary Artery Disease:  Mother with MI age 81 and valvular disease, emphysema Father with MI's first at age 27 and valvular disease., emphysema  Social History: Reviewed history from 09/07/2010 and no changes required. Single, lives in [place] with boyfriend and 3 boys Homemaker Tobacco Use - started [id number], avg 1ppd.  Alcohol Use - no Regular Exercise - no Drug Use - used to use cocaine, last  use in 10/10  Vital Signs:  Patient profile:   35 year old female Height:      67 inches Weight:      314.25 pounds BMI:     49.40 Pulse rate:   51 / minute Pulse rhythm:   regular Resp:     20 per minute BP sitting:   124 / 80  (right arm) Cuff size:   large  Vitals Entered By: Vikki Ports (September 13, 2010 12:11 PM)  Physical Exam  General:  Well developed, well nourished, in no acute distress.  Obese.  Neck:  Neck supple, no JVD. No masses, thyromegaly or  abnormal cervical nodes. Lungs:  Bilateral mild expiratory wheezes Heart:  Non-displaced PMI, chest non-tender; regular rate and rhythm, S1, S2 without murmurs, rubs or gallops. Carotid upstroke normal, no bruit. Pedals normal pulses. No edema, no varicosities. Abdomen:  Bowel sounds positive; abdomen soft and non-tender without masses, organomegaly, or hernias noted. No hepatosplenomegaly. Extremities:  No clubbing or cyanosis. Neurologic:  Alert and oriented x 3. Psych:  Normal affect.   Impression & Recommendations:  Problem # 1:  PERICARDIAL EFFUSION No pericardial effusion on echo in our office today.  I do not have the actual images from the prior echo to review, so wonder if it could have been artifact (she has no reason to have a substantial pericardial effusion).   Problem # 2:  SHORTNESS OF BREATH (SOB) (ICD-786.05) Suspect that this is mainly due to asthma/COPD.  She is following with Dr. Marchelle Gearing for pulmonology. Cough has resolved off ACEI, continue ARB. Needs to quit smoking.  I discussed this with her today again.   Patient Instructions: 1)  Your physician recommends that you schedule an appointment as needed with Dr Shirlee Latch.

## 2011-03-05 ENCOUNTER — Emergency Department (HOSPITAL_COMMUNITY): Payer: Medicaid Other

## 2011-03-05 ENCOUNTER — Emergency Department (HOSPITAL_COMMUNITY)
Admission: EM | Admit: 2011-03-05 | Discharge: 2011-03-06 | Disposition: A | Discharge status: Home / Self Care | Payer: Medicaid Other | Attending: Emergency Medicine | Admitting: Emergency Medicine

## 2011-03-05 DIAGNOSIS — Z79899 Other long term (current) drug therapy: Secondary | ICD-10-CM | POA: Insufficient documentation

## 2011-03-05 DIAGNOSIS — M25569 Pain in unspecified knee: Secondary | ICD-10-CM | POA: Insufficient documentation

## 2011-03-05 DIAGNOSIS — J449 Chronic obstructive pulmonary disease, unspecified: Secondary | ICD-10-CM | POA: Insufficient documentation

## 2011-03-05 DIAGNOSIS — F3130 Bipolar disorder, current episode depressed, mild or moderate severity, unspecified: Secondary | ICD-10-CM | POA: Insufficient documentation

## 2011-03-05 DIAGNOSIS — M25469 Effusion, unspecified knee: Secondary | ICD-10-CM | POA: Insufficient documentation

## 2011-03-05 DIAGNOSIS — W108XXA Fall (on) (from) other stairs and steps, initial encounter: Secondary | ICD-10-CM | POA: Insufficient documentation

## 2011-03-05 DIAGNOSIS — Y92009 Unspecified place in unspecified non-institutional (private) residence as the place of occurrence of the external cause: Secondary | ICD-10-CM | POA: Insufficient documentation

## 2011-03-05 DIAGNOSIS — J4489 Other specified chronic obstructive pulmonary disease: Secondary | ICD-10-CM | POA: Insufficient documentation

## 2011-03-20 LAB — CBC
Hemoglobin: 13.9 g/dL (ref 12.0–15.0)
MCHC: 32.8 g/dL (ref 30.0–36.0)
MCV: 83.3 fL (ref 78.0–100.0)
RBC: 5.07 MIL/uL (ref 3.87–5.11)
WBC: 9.3 10*3/uL (ref 4.0–10.5)

## 2011-03-20 LAB — RAPID URINE DRUG SCREEN, HOSP PERFORMED
Cocaine: POSITIVE — AB
Opiates: NOT DETECTED
Tetrahydrocannabinol: NOT DETECTED

## 2011-03-20 LAB — DIFFERENTIAL
Basophils Absolute: 0 10*3/uL (ref 0.0–0.1)
Eosinophils Relative: 1 % (ref 0–5)
Monocytes Absolute: 0.6 10*3/uL (ref 0.1–1.0)
Neutro Abs: 6.2 10*3/uL (ref 1.7–7.7)
Neutrophils Relative %: 66 % (ref 43–77)

## 2011-03-20 LAB — COMPREHENSIVE METABOLIC PANEL
AST: 21 U/L (ref 0–37)
Albumin: 3.7 g/dL (ref 3.5–5.2)
BUN: 4 mg/dL — ABNORMAL LOW (ref 6–23)
Chloride: 104 mEq/L (ref 96–112)
GFR calc non Af Amer: >60 mL/min (ref >60)
Glucose, Bld: 100 mg/dL — ABNORMAL HIGH (ref 70–99)
Potassium: 3.4 mEq/L — ABNORMAL LOW (ref 3.5–5.1)

## 2011-03-20 LAB — URINALYSIS, ROUTINE W REFLEX MICROSCOPIC
Glucose, UA: NEGATIVE mg/dL
Nitrite: NEGATIVE
Protein, ur: NEGATIVE mg/dL
pH: 6 (ref 5.0–8.0)

## 2011-04-26 NOTE — H&P (Signed)
NAMEKAREENA, ARRAMBIDE NO.:  [id number]   MEDICAL RECORD NO.:  [id number]          PATIENT TYPE:  IPS   LOCATION:  0502                          FACILITY:  [facility]   PHYSICIAN:  Geoffery Lyons, M.D.      DATE OF BIRTH:  [birth year]   DATE OF ADMISSION:  09/04/2007  DATE OF DISCHARGE:                       PSYCHIATRIC ADMISSION ASSESSMENT   IDENTIFYING INFORMATION:  This is an involuntary commitment to the  services of Dr. Geoffery Lyons.  This is a 35 year old single white female.  She presented to the emergency department at [facility]  yesterday claiming that she was overwhelmed, I feel like I'm going to  do something, I have pills to OD.  She took her children, ages 6, 86  and 48, to the children's father's home in [place].  They have been  there for two weeks.  He moved his girlfriend in and the patient has no  where to go.  The 36 year old actually is in [facility] due to  charges and she picks him up every weekend.  The children's father has  been caring for the children since her prior discharge July 22, 2007  where she was becoming clean from drugs and alcohol after one month.  Then, she restarted.  After her discharge from the hospital in August,  she had follow-up at [facility] with Alexia Freestone, the PA  there.  She never followed up with the psychiatrist or with the  therapist after she left [facility] in August.  Her mother was helping with  the children.  She got evicted.  She stated with a friend.  Then, she  was evicted again and the children had to go with their father.  She has  been using Vicodin, most recently prescribed by [facility] ED after she  complained of some ankle pain.   PAST PSYCHIATRIC HISTORY:  She was apparently in [place] in August of  this year, although the paperwork that is with [place] says a year ago,  July 17, 2006 to July 21, 2006.  She was with [place] here at [facility] February 22, 2005 to February 26, 2005 and she denies any  other inpatient hospitalizations.   SOCIAL HISTORY:  She has a GED, a few college courses.  She is working  currently in [employer].  She states she has never married but  she does have three children, ages 10, 82 and 84.   ALCOHOL/DRUG HISTORY:  She began using alcohol as a teenager.  She drank  a case of beer Friday or Saturday over the weekend.  Cocaine powder, she  states she uses this a few times a week and opiates, she is currently  abusing Vicodin.   FAMILY HISTORY:  She states that her mother, her father and her sisters  are all diagnosed as bipolar.   MEDICAL HISTORY:  Primary care Jasson Siegmann is [facility].  She does not have a psychiatrist.  She is known to have hypertension,  obesity, hypercholesterolemia.  She has had vitamin B12 deficiency in  the past and, on her  lab work today, her glucose is elevated at 164.   MEDICATIONS:  She states that she is currently prescribed hydrocodone  APAP 5/500 mg, 1 q.4-6h., albuterol 17 grams, use p.r.n., Niravam 0.5 mg  p.o. 1 q.i.d. and Abilify, dosage was unknown.  She was started on 10 mg  today.   ALLERGIES:  No known drug allergies.   POSITIVE PHYSICAL FINDINGS:  She was medically cleared in the ED at  [facility].  She had no remarkable physical findings other than her UDS  was positive for cocaine, opiates and benzodiazepines.  She is  prescribed opiates and benzodiazepines, not the cocaine.  Her vital  signs, on admission, show she is 66-1/2 inches tall, she weighs 315  pounds, temperature 97.9, blood pressure 134/82 to 123/78, pulse 68-81,  respirations are 18.  She had a tattoo on her posterior right shoulder.  She is status post C-section in 1994, 2000 and 2003 and currently is  reporting back pain.   MENTAL STATUS EXAM:  Today, she is alert and oriented x3.  She is  casually groomed and dressed.  She appears to be adequately nourished.  Her speech is not pressured.  Her  mood is depressed.  Her affect is  flat.  Thought processes are clear, rational and goal-oriented.  She is  reporting that she has a discharge plan.  Judgment and insight appear to  be intact.  Concentration and memory appear to be intact.  She is  currently denying suicidal or homicidal ideation.  She denies auditory  or visual hallucinations.   DIAGNOSES:  AXIS I:  Cocaine abuse; rule out dependence.  Substance  abuse-induced mood disorder.  Reports history for bipolar, type 1.  AXIS II:  Deferred.  AXIS III:  Hypertension, obesity, hypercholesterolemia, past history for  vitamin B12 deficiency and she is currently having an elevated glucose.  AXIS IV:  She is currently homeless, she has problems with primary  support, financial difficulties and she is unable to care for her  children.  AXIS V:   PLAN:  To admit for safety and stabilization.  To help her become clean  from the cocaine.  To restart medications that are appropriate.  Toward  that end, she will be started on Abilify 10 mg p.o. q.d., Celexa 20 mg  p.o. q.d. and she can have Xanax 0.5 mg, 1 p.o. q.i.d. p.r.n.   ESTIMATED LENGTH OF STAY:  Four to five days and we will have the  casemanager work with her parents and the children's father regarding  discharge.      Mickie Leonarda Salon, P.A.-C.      Geoffery Lyons, M.D.  Electronically Signed    MD/MEDQ  D:  09/05/2007  T:  09/05/2007  Job:  [id number]
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
## 2011-04-26 NOTE — Discharge Summary (Signed)
Alyssa [name], BRAND NO.:  [id number]   MEDICAL RECORD NO.:  [id number]          PATIENT TYPE:  IPS   LOCATION:  0502                          FACILITY:  [facility]   PHYSICIAN:  Jasmine Pang, M.D. DATE OF BIRTH:  Alyssa [name], Alyssa [name]   DATE OF ADMISSION:  09/04/2007  DATE OF DISCHARGE:  09/06/2007                               DISCHARGE SUMMARY   IDENTIFYING INFORMATION:  This is a 35 year old single white female.  She was admitted on an involuntary basis on September 04, 2007.   HISTORY OF PRESENT ILLNESS:  The patient presented to the emergency  department at [facility] claiming that she was feeling  overwhelmed, I feel like I am going to do something, I have pills to  OD.  She took her children, ages 40, 31, and 5 to the children's  father's home in [place].  They have been there for 2 weeks.  He moved  his girlfriend in, and the patient has nowhere to go.  The 36 year old  [place], and she picks him up  every weekend.  The children's father has been caring for the children  since her prior discharge on July 22, 2007, where she was becoming  clean from drugs and alcohol for one month.  Then, she restarted.  After  her discharge from the hospital in August, she had a followup at  [facility] with Gar Gibbon, the PA there.  She never  followed up with the psychiatrist or the therapist after she left [facility]  in August.  Her mother was helping with the children.  She got evicted.  She stayed with her friend.  Then, she was evicted again, and the  children had to go back with their father.  She has been using Vicodin  most recently prescribed at the [facility] ED after she complained of some  ankle pain.  She was apparently in [place] in August 2008, although the  paperwork that is with [place] says a year ago, July 17, 2006, to July 21, 2006.  She was with [place] here at [facility] on February 22, 2005, to February 26, 2005.  She denies any other inpatient  hospitalization.  She began using alcohol as a teenager.  She drank a  case of beer Friday or Saturday over the weekend.  She has been using  cocaine powder.  She states she uses this a few times a week and  opiates.  She is currently abusing Vicodin.  She states that her mother,  father, and her sisters are all diagnosed with bipolar.  She does not  have a psychiatrist.  She is known to have hypertension, obesity, and  hypercholesterolemia.  She has a vitamin B12 deficiency in the past.   LABORATORY DATA:  Her glucose was 164.   MEDICATIONS:  She is currently prescribed hydrocodone/APAP 5/500 one q.  4-6h., albuterol 17 g to use p.r.n., Niravam 0.5 mg one p.o. q.i.d., and  Abilify dosage unknown.  She was started on 10 mg on the day of  admission.   ALLERGIES:  She has no known drug allergies.   PHYSICAL FINDINGS:  There were no acute physical or medical problems.  The patient was medically cleared in the ED at [facility].  She had no  remarkable physical findings other than her UDS which was positive for  cocaine, opiates, and benzodiazepines.   ADMISSION LABORATORIES:  Were done in the ED prior to admission.  There  were no acute abnormalities noted.  Glucose was 89 and glycosylated  hemoglobin A1c was 6.1 (4.6 to 6.1).   HOSPITAL COURSE:  Upon admission, the patient was continued on her  hydrocodone APAP 5/500 p.o. q. 4-6h p.r.n. pain, albuterol inhaler  p.r.n. shortness of breath, and Niravam (Xanax) 0.5 mg p.o. one q.i.d.  p.r.n.  She was also started on Ambien 10 mg p.o. q.h.s. p.r.n. insomnia  and Abilify 10 mg p.o. daily, as well as a NicoDerm patch 21 mg for  smoking cessation as per protocol.  On September 05, 2007, she was also  started on Celexa 20 mg p.o. daily.  She was also started on Symmetrel  100 mg p.o. b.i.d. p.r.n. cravings.  The patient tolerated these  medications well with no significant side effects  other than a mild  sedation.  During the hospitalization, she was appropriate in individual  sessions with me.  She was friendly and cooperative.  She was able to  participate appropriately in unit therapeutic groups and activities.  Topics revolved around discussing her stressors prior to admission  including conflicts with her husband.  She denied she would harm  herself.  She talked about the many antidepressants she has been on, but  had not been on Lexapro, Cymbalta, or Celexa.  It was decided to start  Celexa 20 mg daily.  As hospitalization progressed, her mental status  improved.  On September 06, 2007, mental status had improved markedly  from admission status.  The patient was friendly and cooperative with  good eye contact.  Speech was normal, rate, and flow.  Psychomotor  activity was within normal limits.  Mood was depressed.  Affect, wide  range.  There was no suicidal or homicidal ideation.  No thoughts of  self-injurious behavior.  No auditory or visual hallucinations.  No  paranoia or delusions.  Thoughts were logical and goal directed.  Thought content, no predominant theme.  Cognitive was grossly back to  baseline.  It was felt that the patient was safe to be discharged today.  She was discharged to her mother.   DISCHARGE DIAGNOSES:  AXIS I:  Cocaine abuse and mood disorder, not  otherwise specified.  AXIS II:  None.  AXIS III:  Hypertension, obesity, hypercholesteremia, past history for  vitamin B12 deficiency, and currently having an elevated glucose.  AXIS IV:  Severe (she is currently homeless; she has problems with  primary support, financial difficulties and is unable to care for her  children; she also has burden of her psychiatric illness and medical  problems).  AXIS V:  Global assessment of functioning upon discharge was 50.  Global  assessment of functioning upon admission was 35.  Global assessment of  functioning highest past year was 60-65.    DISCHARGE PLAN:  There were no specific activity level or dietary  restrictions.   POST-HOSPITAL CARE PLANS:  The patient will be seen at [facility]  on September 07, 2007, at 8 o'clock a.m.   DISCHARGE MEDICATIONS:  1. Abilify 10 mg daily.  2. Celexa 20 mg daily.  3. Vicodin 5/325 mg tablets 1-2 tabs every 4 hours p.r.n. pain.  4. Albuterol 1 puff as needed for shortness of breath.  5. Niravam 0.5 mg to take as directed.      Jasmine Pang, M.D.  Electronically Signed     BHS/MEDQ  D:  10/08/2007  T:  10/09/2007  Job:  [id number]

## 2011-04-26 NOTE — Op Note (Signed)
[name], [name]                ACCOUNT NO.:  [id number]   MEDICAL RECORD NO.:  [id number]          PATIENT TYPE:  AMB   LOCATION:  DSC                          FACILITY:  [facility]   PHYSICIAN:  Cindee Salt, M.D.       DATE OF BIRTH:  1976/06/13   DATE OF PROCEDURE:  04/09/2009  DATE OF DISCHARGE:                               OPERATIVE REPORT   PREOPERATIVE DIAGNOSIS:  Carpal tunnel syndrome, left hand.   POSTOPERATIVE DIAGNOSIS:  Carpal tunnel syndrome, left hand.   OPERATION:  Decompression, left median nerve.   SURGEON:  Cindee Salt, MD   ASSISTANT:  Carolyne Fiscal, RN   ANESTHESIA:  Forearm-based IV regional.   ANESTHESIOLOGIST:  Maren Beach, MD   HISTORY:  The patient is a 35 year old female with a history of carpal  tunnel syndrome.  EMG nerve conduction is positive, which has not  responded to conservative treatment.  She has undergone release of her  right side in the past with minimal relief of symptoms by another  physician.  She is desirous of release on her left side.  She is aware  of risks and complications including infection, recurrence of injury to  arteries, nerves, tendons, incomplete relief of symptoms, or dystrophy.  In the preoperative area, the patient is seen.  The extremity marked by  both the patient and surgeon.  Antibiotic given.   PROCEDURE:  The patient was brought to the operating room where a fore-  arm based IV regional anesthetic was carried out without difficulty  under the direction of Dr. Katrinka Blazing.  She was prepped using ChloraPrep,  supine position, left arm free.  A 3-minute dry time was allowed, the  patient was then draped.  Time-out taken confirming the patient  procedure.   PROCEDURE:  The longitudinal incision was made, the pump carried down  through subcutaneous tissue.  Bleeders were electrocauterized.  Palmar  fascia was split, superficial palmar arch identified, the flexor tendon  to the ring little finger identified to the ulnar  side of median nerve.  Carpal retinaculum was incised with sharp dissection.  A right-angle and  Sewall retractor were placed between skin and forearm fascia.  The  fascia released for approximately a centimeter and half proximal to the  wrist crease under direct vision.  The canal was explored.  An hour-  glass deformity to the nerve was apparent just distal to the hamate  hook.  The wound was copiously irrigated with saline.  The skin was then  closed interrupted 5-0 Vicryl Rapide sutures.  Local infiltration was  then given with 0.25% Marcaine without epinephrine.  Approximately 4 mL  to 5 mL was used.  A sterile compressive dressing and splint  was applied.  Tourniquet deflated.  All fingers immediately pinked.  She  was taken to the recovery room for observation in satisfactory  condition.  She will be discharged home.  Return to the [facility] in 1 week, on Vicodin.           ______________________________  Cindee Salt, M.D.     GK/MEDQ  D:  04/09/2009  T:  04/09/2009  Job:  [id number]   cc:   [facility]

## 2011-04-26 NOTE — Discharge Summary (Signed)
NAMEKRYSTN, DERMODY NO.:  [id number]   MEDICAL RECORD NO.:  [id number]          PATIENT TYPE:  IPS   LOCATION:  0305                          [facility]ILITY[facility]  [facility]   PHYSICIAN:  Jasmine Pang, M.D. DATE OF BIRTH:  06/27/1976   DATE OF ADMISSION:  06/13/2009  DATE OF DISCHARGE:  06/16/2009                               DISCHARGE SUMMARY   IDENTIFICATION:  This is a 35 year old single white female who was  admitted on a voluntary basis on June 13, 2009.   HISTORY OF PRESENT ILLNESS:  The patient originally presented here at  the [facility].  She stated she was suicidal with a plan to  overdose or hurt herself.  She was not sure that it was safe for her to  return home.  She uses cocaine and alcohol.  She has had increased use  over the last 30 days.  She was tearful.  She reported decrease in sleep  and appetite.  She could not really contract for safety and hence she  was sent to [facility] ED for medical clearance.  The patient  was last with [place] from September 03, 2008, to September 05, 2008, for  similar situation.  She was abusing cocaine at bedtime and was  overwhelmed and threatening to overdose.  She states that she has not  followed through mental health, but through her primary care Reegan Mctighe.  For further admission information, see psychiatric admission assessment.  Initially, she was given a diagnosis of polysubstance abuse, cocaine,  and alcohol, and substance-induced mood disorder.  On axis III, she was  given a diagnosis of chronic obstructive pulmonary disease, sleep apnea,  carpal tunnel release, boil on the left breast, and obesity.   PHYSICAL FINDINGS:  Including the boil have already been mentioned.  There were no other acute physical or medical problems noted.   DIAGNOSTIC STUDIES:  Urine drug screen was positive for cocaine.  CBC  revealed no abnormalities and her electrolytes were good.  She had a  urinary tract  infection.   HOSPITAL COURSE:  Upon admission, the patient was started on Ambien 10  mg p.o. q.h.s. p.r.n. insomnia.  She was also started on albuterol  inhaler 2 puffs q.4 h. p.r.n. asthma and Librium 25 mg p.o. q.4 h.  p.r.n. symptoms of withdrawal or anxiety.  She was also allowed to use  her CPAP and 2 liters of O2 at h.s.  Upon admission, the patient was  alert and oriented.  She was somewhat depressed and anxious.  She was  tolerating the Librium well with no symptoms of withdrawal.  She was  having no suicidal thoughts.  She was hoping to go home soon and did not  want to stay in the hospital for a long.  On June 14, 2009, she was  started on Abilify 10 mg p.o. q.a.m.  She was also started on clonidine  patch 0.1 mg per 24-hour apply weekly.  She was also started on nicotine  patch 21 mg p.o. b.i.d. and lithium carbonate 300 mg p.o. b.i.d.  As  hospitalization  progressed, mental status improved.  The patient was  less depressed and less anxious.  On June 16, 2009, she felt ready to go  home.  She was having no withdrawal symptoms.  Her husband visited and  this has gone well.  She was talking about going back to the [facility] for counseling and substance abuse group counseling, and  medication management.  The patient was given a list of current AA and  NA groups and encouraged to attend 90-90 program at discharge.  Her mood  was less depressed, less anxious.  Affect was consistent with mood.  There was no suicidal or homicidal ideation.  No thoughts of self  injurious behavior.  No auditory or visual hallucinations.  No paranoia  or delusions.  Thoughts were logical and goal-directed.  Thought content  no predominant theme.  Cognitive was grossly intact.  Insight good.  Judgment good.  Impulse control good.  It was felt the patient was safe  for discharge today.   DISCHARGE DIAGNOSES:  Axis I:  Mood disorder, not otherwise specified  and polysubstance abuse.  Axis II:   None.  Axis III:  Chronic obstructive pulmonary disease, sleep apnea, carpal  tunnel release, and boil on the left breast.  Axis IV:  Severe (primary support group issues and legal issues.  She  has a court date on July 01, 2009, for shoplifting, burden of  psychiatric and chemical abuse issues, and burden of medical problems).  Axis V:  Global assessment of functioning was 50 upon discharge.  Global  assessment of functioning was 32 upon admission.  Global assessment of  functioning highest past year was 65-70.   DISCHARGE PLANS:  There is no specific activity level or dietary  restrictions.   POST HOSPITAL CARE PLANS:  The patient will go to [facility] on June 19, 2009, at 9 o'clock.   DISCHARGE MEDICATIONS:  Abilify 10 mg daily, lithium carbonate 300 mg  twice a day, and albuterol inhaler 2 puffs as needed for asthma.      Jasmine Pang, M.D.  Electronically Signed     BHS/MEDQ  D:  06/16/2009  T:  06/17/2009  Job:  [id number]

## 2011-04-29 NOTE — Discharge Summary (Signed)
Alyssa [name], Alyssa [name] NO.:  [id number]   MEDICAL RECORD NO.:  [id number]          PATIENT TYPE:  IPS   LOCATION:  0503                          [facility]ACILITY:  [facility]   PHYSICIAN:  Jeanice Lim, M.D. DATE OF BIRTH:  1976/03/13   DATE OF ADMISSION:  02/22/2005  DATE OF DISCHARGE:  02/26/2005                                 DISCHARGE SUMMARY   IDENTIFYING DATA:  This is a 35 year old Caucasian female, single,  voluntarily admitted, feeling overwhelmed, suicidal thoughts, hearing  voices, reported lability.  She fears that she may hurt someone.  Feeling  this way 6 months worse over the last 2-3 weeks and described depression,  worrying, insomnia, irritability on edge, crying daily, angry, reports  anxiety with racing thoughts, hyperventilation, biting nails, endorsed  isolation, auditory hallucinations, laughing and derogatory statements.  Had  been an inpatient at [facility] 10 years ago with the history of suicide  attempt.  Is followed up with Dr. Hortencia Pilar.   MEDICATIONS:  Off meds for 7-8 months; previously had been on Lithium and  Effexor.   DRUG ALLERGIES:  No known drug allergies.   Physical and neurological exam within normal limits.  Routine admission labs  within normal limits.   MENTAL STATUS EXAM:  Alert and oriented x4, well groomed, well-developed,  good eye contact.  Speech clear.  Even tone and volume, flat affect, mood  depressed, crying tearful, mildly labile.  Thought processes initially goal  directed and relevant.  Judgment and insight were somewhat poor.  Cognition  was intact.  The patient had a history of possible impulsivity.   ADMITTING DIAGNOSES:  AXIS I.  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  Psychotic disorder, NOS.  AXIS II.  Deferred  AXIS III.  Carpal tunnel syndrome.  AXIS IV.  Moderate stressors of primary support group, educational problems,  and occupational problems, and other medical problems.  AXIS V.  Global  assessment of functioning 35/60.   HOSPITAL COURSE:  The patient was optimized on medications, stabilized,  reported positive response to medications, participated in therapy as well  as after care planning.  Appeared to develop better coping skills, and was  appropriate on the unit with no dangerous ideation.  Discharged with no  suicidal or homicidal ideation.  Mood more euthymic, affect brighter.  Judgment and insight have improved.   DISCHARGE MEDICATIONS:  1.  The patient was given medication education; discharged on Z-Pak to take      as directed.  2.  Lithium carbonate 300 to take 8 a.m. and 8 p.m.  3.  Risperdal 1 mg to take 2 q.h.s.   FOLLOWUP:  With primary care physician and Misty Stanley ____________ as well as  [facility], Wednesday, 03/22 at 8:30 with Dr.  ____________.   DISCHARGE DIAGNOSES:  AXIS I.  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  Psychotic disorder, NOS.  AXIS II.  Deferred  AXIS III.  Carpal tunnel syndrome.  AXIS IV.  Moderate stressors of primary support group, educational problems,  and occupational problems, and other medical problems.  AXIS V.  Global assessment of functioning on discharge was 55.      JEM/MEDQ  D:  03/26/2005  T:  03/26/2005  Job:  [id number]

## 2011-04-29 NOTE — H&P (Signed)
[facility]  Patient:    Alyssa [name], Alyssa [name] Visit Number: [id number] MRN: [id number]          Service Type: OBS Location: [facility] Attending Physician:  Oliver Pila Dictated by:   Alvino Chapel, M.D. Admit Date:  12/01/2001 Discharge Date: 12/01/2001                           History and Physical  HISTORY:                      The patient is a 35 year old G 2, P 2, 0, 0, 2, who is admitted for a repeat elective cesarean section, given the history of two previous cesarean sections.  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  PRENATAL LABORATORY DATA:     O-negative, antibody negative.  RPR nonreactive. Rubella immune.  Hepatitis-B surface antigen negative.  HIV negative.  GC negative.  Chlamydia negative.  GBS positive.  Triple screen normal. One-hour Glucola normal.  PAST OBSTETRICAL HISTORY:     In 1994, the patient underwent a low transverse cesarean section for failure to progress.  The infant was 6 pounds 16 ounces. In 2000, the patient had a repeat cesarean section of an 8 pound 3 ounce female infant.  PAST GYN HISTORY:             In 1992, the patient underwent cryo surgery and has had normal Pap smears since that time.  PAST MEDICAL HISTORY:         The patient has some history of depression.  PAST SURGICAL HISTORY:        A C-section x 2.  SOCIAL HISTORY:               The patient is a single parent, supporting three children by herself.  She is a smoker.  She has cut down significantly with the pregnancy.  CURRENT MEDICATIONS:          Effexor for depression, and prenatal vitamins.  PHYSICAL EXAMINATION  VITAL SIGNS:                  Weight 320 pounds, blood pressure 140/78.  GENERAL:                      She is gravid, with an estimated fetal weight of 7-1/2 to 8 pounds.  The patient was counseled as to the risks and benefits of proceeding with  a repeat elective cesarean section, including bleeding and infection, and possible damage to the bowel and bladder.  The patient understands these risks and desires to proceed with surgery.  The patient was also counseled as to the risk of failure of a tubal ligation of 1/200, as well as the possible risk of ectopic pregnancy, should pregnancy occur.  The patient understands these risks, and desires also to proceed with a permanent sterilization at the time of the cesarean section. Dictated by:   Alvino Chapel, M.D. Attending Physician:  Oliver Pila DD:  12/17/01 TD:  12/17/01 Job: [id number]

## 2011-04-29 NOTE — Discharge Summary (Signed)
[facility]  Patient:    Alyssa [name], Alyssa [name] Visit Number: [id number] MRN: [id number]          Service Type: OBS Location: 910A 9108 01 Attending Physician:  Oliver Pila Dictated by:   Zenaida Niece, M.D. Admit Date:  12/19/2001 Discharge Date: 12/21/2001                             Discharge Summary  ADMISSION DIAGNOSES:          1. Intrauterine pregnancy at term.                               2. Prior cesarean section.                               3. Group B Strep carrier.                               4. Desires surgical sterility.  DISCHARGE DIAGNOSES:          1. Intrauterine pregnancy at term.                               2. Prior cesarean section.                               3. Group B Strep carrier.                               4. Desires surgical sterility.  PROCEDURES:                   1. Repeat low transverse cesarean section.                               2. Tubal ligation.  COMPLICATIONS:                None.  CONSULTATIONS:                None.  HISTORY AND PHYSICAL:         Please see the chart for full history and physical, but briefly this is a 34 year old white female gravida 2, para 2-0-0-2 who is at term admitted for repeat elective cesarean section due to two prior cesarean sections.  She also desires permanent sterility.  HOSPITAL COURSE:              The patient was admitted on the day of surgery and underwent a repeat cesarean section with tubal ligation under spinal anesthesia.  Estimated blood loss was 500 cc.  She delivered a vigorous female infant with Apgars of 9 and 9 that weighed 7 pounds and 9 ounces.  Her anatomy was normal except for omental adhesions at the midline and a thin lower uterine segment.  Tubal ligation was performed without complications. Postpartum, she did very well and was rapidly able to ambulate and tolerate a regular diet.  Preoperative hemoglobin of 12.0, postoperative was  10.2.  On the morning of postoperative day #2, the patient requested discharge.  She had good pain control, was ambulating well, tolerated a diet, and was felt to be stable enough for discharge home.  CONDITION ON DISCHARGE:       Stable.  DISPOSITION:                  Discharged to home.  DISCHARGE INSTRUCTIONS:       Her diet is a regular diet.  Her activity is no strenuous activity, no driving, and pelvic rest.  Followup is in five days with Dr. Senaida Ores for staple removal.  Medication is Percocet p.r.n. pain and she is given our discharge pamphlet. Dictated by:   Zenaida Niece, M.D. Attending Physician:  Oliver Pila DD:  01/01/02 TD:  01/02/02 Job: [id number]

## 2011-04-29 NOTE — Op Note (Signed)
[facility]  Patient:    KUMIKO, FISHMAN Visit Number: [id number] MRN: [id number]          Service Type: OBS Location: 910A 9108 01 Attending Physician:  Oliver Pila Dictated by:   Huel Cote, M.D. Proc. Date: 12/19/01 Admit Date:  12/19/2001                             Operative Report  PREOPERATIVE DIAGNOSES:       1. Term pregnancy at 38+ weeks.                               2. Previous cesarean section x2, desires                                  elective repeat.                               3. Desires sterility.  POSTOPERATIVE DIAGNOSES:      1. Term pregnancy at 38+ weeks.                               2. Previous cesarean section x2, desires                                  elective repeat.                               3. Desires sterility.  PROCEDURE:                    Repeat low transverse cesarean section with bilateral tubal ligation.  SURGEON:                      Huel Cote, M.D.  ASSISTANT:                    Zenaida Niece, M.D.  ANESTHESIA:                   Spinal.  FINDINGS:                     There was a vigorous female infant in the vertex presentation.  Apgars were 9 and 9.  Weight was 7 pounds 9 ounces.  There were normal ovaries and tubes noted bilaterally.  There was some omental scarring to the inferior surface of the rectus muscles and fascia.  ESTIMATED BLOOD LOSS:         500 cc.  INTRAVENOUS FLUIDS:           2400 cc LR.  URINE OUTPUT:                 450 cc clear urine.  PROCEDURE:                    Patient was taken to the operating room where spinal anesthesia was obtained without difficulty.  She was then prepped and draped in the normal sterile fashion in the dorsal supine position with a leftward  tilt.  Pfannenstiel skin incision was then made through a preexisting scar and carried through to the underlying layer of fascia by sharp dissection and Bovie cautery.  The fascia  was then opened in the midline and slightly laterally identifying the rectus muscles underneath.  This incision was then extended with Mayo scissors bilaterally.  The inferior aspect was grasped with Kocher clamps, elevated, dissected off the underlying rectus wall.  There were some adhesions of bladder and perineum immediately underlying this area. However, they were able to be taken down sharply.  Attention was then turned to the superior aspect of the fascia which was elevated and dissected off the underlying rectus muscles.  The peritoneal cavity was entered and the mesentery found to be adherent to the midline.  This was taken down with two curved Kelly clamps and 0 free tie of Vicryl placed around each pedicle.  The omentum was thus able to be packed away with a moist laparotomy sponge exposing the uterus.  The remainder of the peritoneal incision was extended inferiorly with careful attention to avoid the bowel and bladder.  The bladder blade was then inserted, the vesicouterine perineum identified and the bladder flap created with Metzenbaum scissors.  The bladder flap was then replaced and the lower uterine segment found to be very thin.  This was incised in a transverse fashion and the cavity entered bluntly.  The infants head was then delivered atraumatically and bulb suctioned.  The remainder of the infants body was then delivered and cord clamped and handed off to the awaiting pediatricians.  Cord blood was obtained and the placenta was expressed manually.  Some adherent membranes were removed with a moist lap sponge and ring forceps.  The uterine incision was then closed with 0 Chromic in a running locked fashion.  The lower uterine segment, as previously stated, was noted to be quite thin in one area where the incision had been repaired and tore through slightly and this was reapproximated with a figure-of-eight suture of 0 chromic.  The incision then appeared hemostatic and  attention was turned to the patients left fallopian tube which was elevated and grasped with a Babcock clamp.  The mesosalpinx was opened with Bovie cautery and a 0 plain tie passed through the mesosalpinx.  The tube itself was then tied off with 0 plain, amputated, and the free pedicles cauterized with Bovie cautery. This was found to be hemostatic and was thus returned to the abdomen.  In a similar fashion the right fallopian tube was grasped with Babcock clamp, elevated, the mesosalpinx opened, and a - free tie of plain suture passed around the 2 cm buckle of tube.  This was then amputated and the free ends cauterized with Bovie cautery.  This was hemostatic and thus returned to the abdomen.  The uterine incision was once again inspected and found to be hemostatic.  Therefore, all instruments and sponges were removed from the abdomen.  The rectus muscles were then reapproximated with 0 Vicryl in several interrupted sutures as quite a diastasis had developed with omentum and bowel. At the most superior aspect there was some bleeding noted after a suture was placed in the rectus muscle and this was suture ligated with figure-of-eight suture of 0 Vicryl with good hemostasis noted.  The fascia was then closed with 0 Vicryl in a running fashion.  The subcutaneous tissue was reapproximated with 0 plain in a running fashion and the skin was closed with staples.  Sponge, lap, and needle counts  were correct x2 and the patient was taken to the recovery room in stable condition. Dictated by:   Huel Cote, M.D. Attending Physician:  Oliver Pila DD:  12/19/01 TD:  12/19/01 Job: [id number]

## 2011-06-21 ENCOUNTER — Emergency Department (HOSPITAL_COMMUNITY)
Admission: EM | Admit: 2011-06-21 | Discharge: 2011-06-22 | Disposition: A | Discharge status: Home / Self Care | Payer: Medicaid Other | Attending: Emergency Medicine | Admitting: Emergency Medicine

## 2011-06-21 DIAGNOSIS — R0609 Other forms of dyspnea: Secondary | ICD-10-CM | POA: Insufficient documentation

## 2011-06-21 DIAGNOSIS — R0602 Shortness of breath: Secondary | ICD-10-CM | POA: Insufficient documentation

## 2011-06-21 DIAGNOSIS — J4489 Other specified chronic obstructive pulmonary disease: Secondary | ICD-10-CM | POA: Insufficient documentation

## 2011-06-21 DIAGNOSIS — IMO0001 Reserved for inherently not codable concepts without codable children: Secondary | ICD-10-CM | POA: Insufficient documentation

## 2011-06-21 DIAGNOSIS — R0989 Other specified symptoms and signs involving the circulatory and respiratory systems: Secondary | ICD-10-CM | POA: Insufficient documentation

## 2011-06-21 DIAGNOSIS — J449 Chronic obstructive pulmonary disease, unspecified: Secondary | ICD-10-CM | POA: Insufficient documentation

## 2011-06-21 DIAGNOSIS — R05 Cough: Secondary | ICD-10-CM | POA: Insufficient documentation

## 2011-06-21 DIAGNOSIS — F17200 Nicotine dependence, unspecified, uncomplicated: Secondary | ICD-10-CM | POA: Insufficient documentation

## 2011-06-21 DIAGNOSIS — F319 Bipolar disorder, unspecified: Secondary | ICD-10-CM | POA: Insufficient documentation

## 2011-06-21 DIAGNOSIS — R059 Cough, unspecified: Secondary | ICD-10-CM | POA: Insufficient documentation

## 2011-06-22 ENCOUNTER — Emergency Department (HOSPITAL_COMMUNITY): Payer: Medicaid Other

## 2011-06-22 LAB — BASIC METABOLIC PANEL
Calcium: 8.8 mg/dL (ref 8.4–10.5)
Chloride: 102 mEq/L (ref 96–112)
Creatinine, Ser: 0.7 mg/dL (ref 0.50–1.10)
Potassium: 3.8 mEq/L (ref 3.5–5.1)
Sodium: 138 mEq/L (ref 135–145)

## 2011-06-22 LAB — URINALYSIS, ROUTINE W REFLEX MICROSCOPIC
Glucose, UA: NEGATIVE mg/dL
Hgb urine dipstick: NEGATIVE
Leukocytes, UA: NEGATIVE
Protein, ur: NEGATIVE mg/dL
Urobilinogen, UA: 0.2 mg/dL (ref 0.0–1.0)
pH: 6 (ref 5.0–8.0)

## 2011-06-22 LAB — DIFFERENTIAL
Basophils Relative: 0 % (ref 0–1)
Lymphocytes Relative: 30 % (ref 12–46)
Monocytes Absolute: 0.6 10*3/uL (ref 0.1–1.0)

## 2011-06-22 LAB — CBC
HCT: 39.8 % (ref 36.0–46.0)
Hemoglobin: 12.2 g/dL (ref 12.0–15.0)
MCV: 81.9 fL (ref 78.0–100.0)
RDW: 15.4 % (ref 11.5–15.5)

## 2011-06-27 ENCOUNTER — Encounter: Payer: Self-pay | Admitting: Cardiology

## 2011-08-06 ENCOUNTER — Emergency Department: Payer: Self-pay | Admitting: Internal Medicine

## 2011-08-10 ENCOUNTER — Emergency Department: Payer: Self-pay | Admitting: Emergency Medicine

## 2011-08-22 ENCOUNTER — Inpatient Hospital Stay (HOSPITAL_COMMUNITY)
Admission: EM | Admit: 2011-08-22 | Discharge: 2011-08-25 | DRG: 917 | Disposition: A | Discharge status: Other Acute Inpatient Hospital | Payer: Medicaid Other | Attending: Internal Medicine | Admitting: Internal Medicine

## 2011-08-22 DIAGNOSIS — E662 Morbid (severe) obesity with alveolar hypoventilation: Secondary | ICD-10-CM | POA: Diagnosis present

## 2011-08-22 DIAGNOSIS — M549 Dorsalgia, unspecified: Secondary | ICD-10-CM | POA: Diagnosis present

## 2011-08-22 DIAGNOSIS — T39094A Poisoning by salicylates, undetermined, initial encounter: Secondary | ICD-10-CM | POA: Diagnosis present

## 2011-08-22 DIAGNOSIS — J189 Pneumonia, unspecified organism: Secondary | ICD-10-CM | POA: Diagnosis present

## 2011-08-22 DIAGNOSIS — G4730 Sleep apnea, unspecified: Secondary | ICD-10-CM | POA: Diagnosis present

## 2011-08-22 DIAGNOSIS — J441 Chronic obstructive pulmonary disease with (acute) exacerbation: Secondary | ICD-10-CM | POA: Diagnosis present

## 2011-08-22 DIAGNOSIS — T398X2A Poisoning by other nonopioid analgesics and antipyretics, not elsewhere classified, intentional self-harm, initial encounter: Secondary | ICD-10-CM | POA: Diagnosis present

## 2011-08-22 DIAGNOSIS — Z9981 Dependence on supplemental oxygen: Secondary | ICD-10-CM

## 2011-08-22 DIAGNOSIS — T394X2A Poisoning by antirheumatics, not elsewhere classified, intentional self-harm, initial encounter: Secondary | ICD-10-CM | POA: Diagnosis present

## 2011-08-22 DIAGNOSIS — T50992A Poisoning by other drugs, medicaments and biological substances, intentional self-harm, initial encounter: Secondary | ICD-10-CM | POA: Diagnosis present

## 2011-08-22 DIAGNOSIS — T428X1A Poisoning by antiparkinsonism drugs and other central muscle-tone depressants, accidental (unintentional), initial encounter: Principal | ICD-10-CM | POA: Diagnosis present

## 2011-08-22 DIAGNOSIS — I10 Essential (primary) hypertension: Secondary | ICD-10-CM | POA: Diagnosis present

## 2011-08-22 DIAGNOSIS — F17200 Nicotine dependence, unspecified, uncomplicated: Secondary | ICD-10-CM | POA: Diagnosis present

## 2011-08-22 DIAGNOSIS — F319 Bipolar disorder, unspecified: Secondary | ICD-10-CM | POA: Diagnosis present

## 2011-08-22 DIAGNOSIS — E785 Hyperlipidemia, unspecified: Secondary | ICD-10-CM | POA: Diagnosis present

## 2011-08-23 ENCOUNTER — Inpatient Hospital Stay (HOSPITAL_COMMUNITY): Payer: Medicaid Other

## 2011-08-23 ENCOUNTER — Emergency Department (HOSPITAL_COMMUNITY): Payer: Medicaid Other

## 2011-08-23 LAB — URINALYSIS, ROUTINE W REFLEX MICROSCOPIC
Glucose, UA: NEGATIVE mg/dL
Hgb urine dipstick: NEGATIVE
Leukocytes, UA: NEGATIVE
Protein, ur: NEGATIVE mg/dL
Specific Gravity, Urine: 1.006 (ref 1.005–1.030)
pH: 7.5 (ref 5.0–8.0)

## 2011-08-23 LAB — RAPID URINE DRUG SCREEN, HOSP PERFORMED
Benzodiazepines: NOT DETECTED
Cocaine: NOT DETECTED

## 2011-08-23 LAB — BASIC METABOLIC PANEL
BUN: 6 mg/dL (ref 6–23)
Chloride: 101 mEq/L (ref 96–112)
Creatinine, Ser: 0.94 mg/dL (ref 0.50–1.10)
GFR calc Af Amer: >60 mL/min (ref >60)

## 2011-08-23 LAB — CBC
MCH: 25.3 pg — ABNORMAL LOW (ref 26.0–34.0)
MCV: 83.1 fL (ref 78.0–100.0)
Platelets: 329 10*3/uL (ref 150–400)
RBC: 5.26 MIL/uL — ABNORMAL HIGH (ref 3.87–5.11)

## 2011-08-23 LAB — DIFFERENTIAL
Eosinophils Absolute: 0.2 10*3/uL (ref 0.0–0.7)
Eosinophils Relative: 2 % (ref 0–5)
Lymphs Abs: 3.7 10*3/uL (ref 0.7–4.0)
Monocytes Relative: 5 % (ref 3–12)
Neutrophils Relative %: 59 % (ref 43–77)

## 2011-08-23 MED ORDER — IOHEXOL 300 MG/ML  SOLN
100.0000 mL | Freq: Once | INTRAMUSCULAR | Status: AC | PRN
  Administered 2011-08-23: 90 mL via INTRAVENOUS

## 2011-08-23 NOTE — H&P (Signed)
**Note Alyssa [name]** [name], [name] NO.:  [id number]  MEDICAL RECORD NO.:  [id number]  LOCATION:  WLED                         FACILITY:  [facility]  PHYSICIAN:  Andreas Blower, MD       DATE OF BIRTH:  Sep 01, 1976  DATE OF ADMISSION:  08/22/2011 DATE OF DISCHARGE:                             HISTORY & PHYSICAL   PRIMARY CARE PHYSICIAN:  Evelene Croon, MD with [facility]  CHIEF COMPLAINT:  Shortness of breath, feeling depressed with suicidal ideation and overdose on Soma and Xanax.  HISTORY OF PRESENT ILLNESS:  Mr. Alyssa [name] is a 35 year old morbid obese female with history of COPD, bipolar disorder, depression, and sleep apnea; who presents with the above complaints.  She reported that her Medicaid had run out about a month ago and has not been taking her psychiatric medications.  She reported that her last date of renewal was yesterday, she was feeling hopeless and tired, as a result was feeling depressed and wanted to end it all.  As a result, took 20 tablets of Soma over a couple of hours and also took 10 tablets of Xanax yesterday again on a couple of hours.  The patient is not able to give me a specific time of when she took these medications.  She reported that she had contacted Behavioral Health, contacted her primary care physician. She had a balance remaining to be paid with her primary care physician. As a result, could not get to see him in time.  She also reported that she has been feeling short of breath and has been coughing, had initially gone to [facility] on August 26th, where she was diagnosed with initially having flu symptoms and was given an antibiotic.  Her symptoms were not improving.  As a result, she presented back on August 30th with similar complaints and chest x-ray at that time showed pneumonia.  As a result, her antibiotics were transitioned to Levaquin which she has completed a course.  Since feeling depressed  and short of breath, she presented to the ER for further evaluation.  She does report feeling suicidal, but has no concrete plan in mind.  She reports that sometimes she wishes that she never woke up.  Since stopping her psychiatric medications, she does report hearing voices and having auditory hallucinations.  At times, these voices tell her to do something bad to other people.  Given her shortness of breath with wheezing, the Hospitalist Service was asked to admit the patient for further care and management. Denies any recent fever, chills, nausea, or vomiting. Denies any chest pain. Denies any abdominal pain.  REVIEW OF SYSTEMS:  All systems were reviewed with the patient and were positive as per HPI.  Otherwise, all other systems are negative.  PAST MEDICAL HISTORY: 1. History of COPD.  Reports that she uses oxygen 2 L at night. 2. History of bipolar disorder. 3. Depression. 4. History of carpal tunnel syndrome. 5. History of sleep apnea. 6. Morbid obesity. 7. History of abscess on left breast in 2010. 8. Tobacco history. 9. Hypertension. 10.Hyperlipidemia.  SOCIAL HISTORY:  Reports that she is smoking 1 pack per day.  Denies any  alcohol use and has not used any alcohol in the last 2 years.  Denies any illegal drugs or substances.  FAMILY HISTORY:  Mother had kidney problems.  Father had heart, kidney, and back problems.  Had a grandfather who had a blood disorder and a grandmother with colon cancer.  HOME MEDICATIONS: 1. Albuterol inhaler 1 to 2 puffs 4 times a day as needed. 2. Soma 350 one tablet p.o. 3 times day. 3. Gabapentin 600 mg p.o. 3 times a day. 4. Gemfibrozil 600 mg p.o. daily. 5. Duloxetine 30 mg p.o. daily. 6. Oxycodone/APAP 10/325 one tablet q.i.d., scheduled. 7. Abilify 10 mg p.o. daily. 8. Beclomethasone inhaled 80 mcg 2 puffs twice daily. 9. Spiriva 18 mcg inhaled p.o. q.a.m. 10.Albuterol nebulizer 1 nebulizer 4 times a day. 11.Lisinopril 20 mg  p.o. daily. 12.Alprazolam 1 mg p.o. 3 times a day. 13.Phentermine 37.5 mg p.o. daily. 14.Advair Diskus 250/50 one puff twice daily. 15.Vitamin D3 of 5000 units p.o. daily.  PHYSICAL EXAMINATION:  VITAL SIGNS:  Temperature is 98.0, blood pressure is 104/62, pulse of 97, respiration 24, saturating at 95% on few liters of oxygen. GENERAL:  The patient was alert and oriented, did not appear to be in acute distress, was lying in bed comfortably. HEENT:  Extraocular motions are intact.  Pupils are equal, round.  Had moist mucous membranes. NECK:  Supple. HEART:  Regular with S1 and S2. LUNGS:  Scattered crackles at bases.  Had inspiratory and expiratory wheezing, worse with expiration. ABDOMEN:  Soft, nontender, nondistended.  Positive bowel sounds. EXTREMITIES:  The patient with good peripheral pulses, with 1 to 2+ lower extremity edema. NEURO:  Cranial nerves II-XII grossly intact, had 5/5 motor strength in upper as well as lower extremities.  RADIOLOGY/IMAGING:  The patient had a chest x-ray, 2-view, which showed mild bibasilar opacities, atelectasis versus infiltrate.  LABORATORY DATA:  CBC shows a white count of 11.2, hemoglobin 13.3,hematocrit 43.7, platelet count 329.  Electrolytes normal with a BUN of 6, creatinine 0.90.  Urine drug screen is negative.  Blood alcohol level less than 11.  Salicylate level less than 2.0.  Acetaminophen level less than 15.0.  UA is negative for nitrites and leukocytes.  ASSESSMENT/PLAN: 1. Shortness of breath, likely due to chronic obstructive pulmonary     disease exacerbation with community-acquired pneumonia.  Given her     history of chronic obstructive pulmonary disease, suspect that the     patient has a chronic obstructive pulmonary disease exacerbation     from recent pneumonia.  We will start the patient on azithromycin     and ceftriaxone, and will determine how the patient does clinically     during the course of hospital stay.  We  will continue O2.  Given     the patient is morbidly obese, we will get a CT of the chest with     contrast to rule out pulmonary embolism.  Will also start the     patient on IV steroids for chronic obstructive pulmonary disease     exacerbation and transition to oral steroids. 2. Chronic obstructive pulmonary disease exacerbation, management as     above. 3. Leukocytosis, likely due to chronic obstructive pulmonary disease     exacerbation with community-acquired pneumonia. 4. Depression with suicidal ideation and overdose.  We will admit the     patient and monitor the patient on tele for 24 hours.  Spoke with     [employer] who reported that the patient is  past the     need for observation and recommended continuing supportive care.     We will hold all of her pain medications.  Behavioral Health has     been consulted. 5. Hypotension.  Maybe due to her pain medications.  Monitor for now.     Blood pressure is improving.  Hold antihypertensive medications. 6. Hypertension.  Holding antihypertensive medications due to     hypotension.  Resume antihypertensive medications based on     improvement in the patient's blood pressure. 7. History of sleep apnea.  Continue CPAP. 8. Hyperlipidemia.  Continue gemfibrozil. 9. Prophylaxis.  The patient will be on therapeutic Lovenox and if CT     of the chest is negative for pulmonary embolism, we will transition     to prophylactic Lovenox dose. 10.Code status.  The patient is full code. 11.Disposition, pending.  Time spent on admission, talking to the patient, and coordinating care was 50 minutes.   Andreas Blower, MD   SR/MEDQ  D:  08/23/2011  T:  08/23/2011  Job:  [id number]  Electronically Signed by Wardell Heath Ardell Makarewicz  on 08/23/2011 08:54:06 PM

## 2011-08-24 DIAGNOSIS — F39 Unspecified mood [affective] disorder: Secondary | ICD-10-CM

## 2011-08-24 LAB — CBC
HCT: 39.8 % (ref 36.0–46.0)
Hemoglobin: 12 g/dL (ref 12.0–15.0)
MCH: 24.9 pg — ABNORMAL LOW (ref 26.0–34.0)
MCHC: 30.2 g/dL (ref 30.0–36.0)
MCV: 82.7 fL (ref 78.0–100.0)
Platelets: 337 K/uL (ref 150–400)
RBC: 4.81 MIL/uL (ref 3.87–5.11)
RDW: 16.2 % — ABNORMAL HIGH (ref 11.5–15.5)
WBC: 15.7 K/uL — ABNORMAL HIGH (ref 4.0–10.5)

## 2011-08-24 LAB — BASIC METABOLIC PANEL
Calcium: 9.3 mg/dL (ref 8.4–10.5)
GFR calc Af Amer: >60 mL/min (ref >60)
GFR calc non Af Amer: >60 mL/min (ref >60)
Potassium: 4.3 mEq/L (ref 3.5–5.1)
Sodium: 137 mEq/L (ref 135–145)

## 2011-08-25 ENCOUNTER — Inpatient Hospital Stay (HOSPITAL_COMMUNITY)
Admission: AD | Admit: 2011-08-25 | Discharge: 2011-08-29 | DRG: 885 | Disposition: A | Discharge status: Home / Self Care | Payer: Medicaid Other | Source: Ambulatory Visit | Attending: Psychiatry | Admitting: Psychiatry

## 2011-08-25 DIAGNOSIS — E785 Hyperlipidemia, unspecified: Secondary | ICD-10-CM

## 2011-08-25 DIAGNOSIS — F17200 Nicotine dependence, unspecified, uncomplicated: Secondary | ICD-10-CM

## 2011-08-25 DIAGNOSIS — G4730 Sleep apnea, unspecified: Secondary | ICD-10-CM

## 2011-08-25 DIAGNOSIS — J189 Pneumonia, unspecified organism: Secondary | ICD-10-CM

## 2011-08-25 DIAGNOSIS — J449 Chronic obstructive pulmonary disease, unspecified: Secondary | ICD-10-CM

## 2011-08-25 DIAGNOSIS — F39 Unspecified mood [affective] disorder: Secondary | ICD-10-CM

## 2011-08-25 DIAGNOSIS — J9819 Other pulmonary collapse: Secondary | ICD-10-CM

## 2011-08-25 DIAGNOSIS — F259 Schizoaffective disorder, unspecified: Principal | ICD-10-CM

## 2011-08-25 DIAGNOSIS — J4489 Other specified chronic obstructive pulmonary disease: Secondary | ICD-10-CM

## 2011-08-25 DIAGNOSIS — Z9981 Dependence on supplemental oxygen: Secondary | ICD-10-CM

## 2011-08-25 DIAGNOSIS — I10 Essential (primary) hypertension: Secondary | ICD-10-CM

## 2011-08-25 DIAGNOSIS — IMO0002 Reserved for concepts with insufficient information to code with codable children: Secondary | ICD-10-CM

## 2011-08-25 DIAGNOSIS — F411 Generalized anxiety disorder: Secondary | ICD-10-CM

## 2011-08-25 DIAGNOSIS — F1910 Other psychoactive substance abuse, uncomplicated: Secondary | ICD-10-CM

## 2011-08-25 DIAGNOSIS — Z79899 Other long term (current) drug therapy: Secondary | ICD-10-CM

## 2011-08-26 DIAGNOSIS — F411 Generalized anxiety disorder: Secondary | ICD-10-CM

## 2011-08-26 DIAGNOSIS — F259 Schizoaffective disorder, unspecified: Secondary | ICD-10-CM

## 2011-08-27 LAB — HEMOGLOBIN A1C
Hgb A1c MFr Bld: 6 % — ABNORMAL HIGH (ref <5.7)
Mean Plasma Glucose: 126 mg/dL — ABNORMAL HIGH (ref <117)

## 2011-08-28 NOTE — Assessment & Plan Note (Signed)
NAMEARONDA, Alyssa [name] NO.:  [id number]  MEDICAL RECORD NO.:  [id number]  LOCATION:  0454                          FACILITY:  [facility]  PHYSICIAN:  Franchot Gallo, MD     DATE OF BIRTH:  1976-10-31  DATE OF ADMISSION:  08/25/2011 DATE OF DISCHARGE:                      PSYCHIATRIC ADMISSION ASSESSMENT   HISTORY OF PRESENT ILLNESS:  This is a 35 year old single white female. She presented to the ED at [facility].  This was on 09/10.  She reported that she felt suicidal.  She had been depressed and hearing voices.  She stated she was scared.  She thought it was her nerves. Although she felt suicidal, she did not have a distinct plan.  She says that she lost her Medicaid at the end of July.  She has been unable to afford her medications.  She also reported that she had overdosed on 20 Soma and 10 Xanax; however, her UDS was completely negative, and she also reported having been drinking alcohol, but she had no abnormalities of liver function and no measurable alcohol.  She has been treated several times over the past 3 months for cough, and she is currently being treated for community acquired pneumonia on September 11.  She was found to have mild bibasilar opacities atelectasis versus infiltrate. She had a CT angiogram of the chest for shortness of breath, history of COPD, plus she has apparently had a low PO2.  She was noted to have scattered small periaortic, precarinal and hilar nodes without evidence of mediastinal lymphadenopathy.  They continued to see shaky air space opacities within the right lower lobe and left lung.  This may reflect mild pneumonia or possibly atelectasis.  PAST PSYCHIATRIC HISTORY:  She was last with [place] July 3-6, 2010 for a similar situation.  She also reports that she has had admissions over at [facility], but she has not had any formal follow-up outpatient-wise.  SOCIAL HISTORY:  She is currently living [place] with her boyfriend and 3  sons ages 96, 53 and 3.  She has not been employed for the past 3 years she has a disability file pending.  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  FAMILY HISTORY:  Mother and father both have substance abuse history.  ALCOHOL AND DRUG HISTORY:  Negative.  PRIMARY CARE PROVIDER:  [facility].  She does not have any psychiatric care, although she says she could probably go to either the [facility] or to [place].  MEDICAL PROBLEMS: 1. Discharge diagnosis says that she has a history of chronic     obstructive pulmonary disease.  She is on home oxygen and CPAP. 2. Obesity 3. Ventilation syndrome on CPAP at night 4. Hypertension 5. Ongoing tobacco abuse 6. Morbid obesity 7. Hypertension 8. Hyperlipidemia  MEDICATIONS:  At the time of transfer from inpatient her discharge medications were: 1. Albuterol nebulizers q.i.d. 2. Robitussin 5 mg q.4 h as needed for 5 days 3. Nicotine 14 mg q.24 h p.r.n. 4. Prednisone taper dose 60 mg for another 3 days, then 40 for 3 days,     then 20 for 3 days, then stop 5. Norvasc 10 mg p.o. daily 6. Abilify 10 mg p.o. daily  7. Advair Diskus 250/50 one puff b.i.d. 8. Alprazolam 1 mg p.o. b.i.d. p.r.n. 9. Cymbalta 30 mg p.o. daily 10.Neurontin 600 mg t.i.d. 11.Gemfibrozil 600 mg p.o. daily 12.Lisinopril 20 mg p.o. daily 13.Oxycodone/Tylenol 10/325 q.i.d. 14.Qvar inhaler 80 mcg 2 puffs b.i.d. 15.Spiriva 18 mcg inhaler daily 16.Albuterol inhaler 1-2 puffs q.4 h p.r.n. 17.Vitamin D3 5000 units p.o. daily 18.Avelox 400 p.o. for the next 5 days  DRUG ALLERGIES:  No known drug allergies.  POSITIVE PHYSICAL FINDINGS:  This is a morbidly obese white female who has severe upper respiratory congestion which is audible without a stethoscope.  She coughs and gets choked up.  Her vital signs were stable.  Her last vital signs showed that she was afebrile 98.1, pulse was 98, respirations were 18, blood pressure 148/85.  Her O2 sats have been running  92-96.  MENTAL STATUS EXAM:  Tonight she is alert and oriented.  She was appropriately groomed, dressed and nourished.  She became respiratorily compromised after she was coughing.  Her mood is depressed and anxious as is her affect.  She is afraid that there is something else going on as despite multiple courses of antibiotics and prednisone she is not clearing.  Thought processes are clear, rational and goal oriented. Judgment and insight are intact.  Concentration and memory are intact. Intelligence is average.  She is passively suicidal in that she wants all of this to pass or just to go on and be out of the picture so she is not a problem to anybody, but she has no active plan.  ADMISSION DIAGNOSIS:  AXIS I:  Mood disorder NOS, history for polysubstance abuse AXIS II:  None AXIS III:  COPD, sleep apnea.  She currently has atelectasis versus infiltrates and is under care for presumed community acquired pneumonia. AXIS IV:  Problems with primary with primary support group, economics AXIS V:  45  PLAN:  The plan is to admit for further stabilization and safety. Medications will be adjusted as indicated.  We will get her into [facility], and estimated length of stay is 3-5 days.     Mickie Leonarda Salon, P.A.-C.   ______________________________ Franchot Gallo, MD    MD/MEDQ  D:  08/25/2011  T:  08/26/2011  Job:  [id number]  Electronically Signed by Jaci Lazier ADAMS P.A.-C. on 08/28/2011 12:25:51 PM Electronically Signed by Franchot Gallo MD on 08/28/2011 09:21:51 PM

## 2011-08-29 LAB — GLUCOSE, CAPILLARY: Glucose-Capillary: 118 mg/dL — ABNORMAL HIGH (ref 70–99)

## 2011-09-07 NOTE — Consult Note (Signed)
NAMELENNOX, LEIKAM NO.:  [id number]  MEDICAL RECORD NO.:  [id number]  LOCATION:  1521                         FACILITY:  [facility]  PHYSICIAN:  Eulogio Ditch, MD DATE OF BIRTH:  [birth year]  DATE OF CONSULTATION:  08/24/2011 DATE OF DISCHARGE:                                CONSULTATION   REASON FOR CONSULTATION:  Depression and having suicidal ideations.  HISTORY OF PRESENT ILLNESS:  35 year old Caucasian female with history of bipolar disorder, who admitted on the medical floor.  The patient took 20 tablets of Soma over a couple of hours and also took 10 tablets of Xanax again on a couple of hours.  The patient stated that she was not trying to kill herself.  She was trying to calm herself.  The patient stated that she is feeling depressed for the last 2 to 3 weeks. She is overwhelmed because of the financial issues, have to take care of her three boys, 55, 68 and 6-year-old.  She was also hearing voices, mainly one voice telling her to kill herself.  The patient felt hopeless and helpless as she could not get her pain medications because of the financial issues.  The patient was also noncompliant with her psych medications Abilify and Cymbalta prescribed by the primary care physician as she stopped seeing psychiatrist.  The patient also stated in the past she was on lithium and that was working well for her, but she could not afford to go to see the doctors, so she stopped taking the medication.  PAST MEDICAL HISTORY: 1. History of COPD. 2. Carpal tunnel syndrome. 3. Sleep apnea. 4. Hypertension. 5. Hyperlipidemia.  SUBSTANCE ABUSE HISTORY:  The patient denies abusing alcohol or illegal drugs.  The patient is sober after getting DWI in 2010.  SOCIAL HISTORY:  The patient lives with her 3 boys and boyfriend.  She has support from mother and sister who lives across the street.  The patient has applied for the disability.  CURRENT PSYCHIATRIC  MEDICATION:  The patient is on Abilify 10 mg p.o. daily and Cymbalta 30 mg p.o. daily.  MENTAL STATUS EXAMINATION:  The patient is calm, cooperative during interview.  Fair eye contact.  No abnormal movements noticed.  Mood is depressed.  Affect, mood congruent.  Speech is soft, slow.  Thought process, the patient is logical and goal-directed.  Easily redirectable during interview, not internally preoccupied.  No tangentiality or circumstantiality noted.  She verbalized hearing voices command dive, but she is not disorganized.  Cognition:  Alert, awake, oriented x3. Memory:  Immediate, recent and remote fair.  Attention and concentration fair.  Abstraction ability, fair.  Insight and judgment fair.  The patient still have suicidal ideations without any active plan.  DIAGNOSES:  Axis I:  Bipolar disorder, depressed type. Axis II:  Deferred. Axis III:  See medical notes. Axis IV:  Chronic mental health issues along with psychosocial stressors. Axis V:  40.  RECOMMENDATIONS: 1. The patient can be continued on Abilify 10 mg daily along with her     Cymbalta 30 mg p.o. daily. 2. Once medically cleared, she can be transferred to [facility]     for further  stabilization. 3. The patient agrees with the treatment plan.     Eulogio Ditch, MD     SA/MEDQ  D:  08/24/2011  T:  08/24/2011  Job:  [id number]  Electronically Signed by Eulogio Ditch  on 09/07/2011 03:16:45 PM

## 2011-09-13 LAB — POCT PREGNANCY, URINE: Preg Test, Ur: NEGATIVE

## 2011-09-13 LAB — WET PREP, GENITAL
Trich, Wet Prep: NONE SEEN
Yeast Wet Prep HPF POC: NONE SEEN

## 2011-09-13 LAB — URINALYSIS, ROUTINE W REFLEX MICROSCOPIC
Bilirubin Urine: NEGATIVE
Glucose, UA: NEGATIVE
Nitrite: NEGATIVE
Specific Gravity, Urine: 1.025
pH: 6

## 2011-09-13 LAB — GC/CHLAMYDIA PROBE AMP, GENITAL
Chlamydia, DNA Probe: NEGATIVE
GC Probe Amp, Genital: NEGATIVE

## 2011-09-15 NOTE — Discharge Summary (Signed)
  Alyssa [name], Alyssa [name] NO.:  [id number]  MEDICAL RECORD NO.:  [id number]  LOCATION:  0500                          FACILITY:  [facility]  PHYSICIAN:  Franchot Gallo, MD     DATE OF BIRTH:  September 07, 1976  DATE OF ADMISSION:  08/25/2011 DATE OF DISCHARGE:  08/29/2011                              DISCHARGE SUMMARY   REASON FOR ADMISSION:  This is a 35 year old, single female that has been feeling depressed and hearing voices, feeling scared.  She was feeling suicidal, although she had no distinct plan.  She reports a stressor that she lost her Medicaid at the end of July, unable to afford her medications.  She overdosed on 20 Soma and 10 Xanax tablets.  FINAL IMPRESSION:  Axis I:  Schizoaffective disorder, depressed type. Generalized anxiety disorder. Axis II:  Deferred. Axis III:  History of chronic obstructive pulmonary disease, hypertension, hyperlipidemia. Axis IV: Primary support system discord and economic issues. Axis V:  At discharge is 70.  PHYSICAL FINDINGS:  Her O2 sats were running 92% to 96%.  She was afebrile at 98.1.  Hemoglobin A1c was at 6 and her blood sugar was 89.   DICTATION ENDED HERE.     Landry Corporal, N.P.   ______________________________ Franchot Gallo, MD    JO/MEDQ  D:  09/13/2011  T:  09/13/2011  Job:  [id number]  Electronically Signed by Limmie PatriciaP. on 09/14/2011 02:53:42 PM Electronically Signed by Franchot Gallo MD on 09/15/2011 08:25:14 AM

## 2011-09-19 NOTE — Discharge Summary (Signed)
  Alyssa [name], MISH NO.:  [id number]  MEDICAL RECORD NO.:  [id number]  LOCATION:                                 FACILITY:  PHYSICIAN:  Franchot Gallo, MD     DATE OF BIRTH:  1976/01/13  DATE OF ADMISSION: DATE OF DISCHARGE:                              DISCHARGE SUMMARY   ADDENDUM TO A DISCHARGE SUMMARY  SIGNIFICANT FINDINGS:  The patient was admitted to the adult milieu for safety and stabilization.  The patient was participating in groups.  She was reporting depressive symptoms.  She was reporting sleeping well with her CPAP machine, recovering from pneumonia.  Her appetite was satisfactory.  She was still endorsing depressive symptoms but having no suicidal or homicidal thoughts.  Continued with auditory hallucinations that were derogatory in nature.  We increased her Cymbalta 60 mg b.i.d. to help with her depression/anxiety.  Decreased her Xanax as the patient was feeling somewhat sedated.  She was still having some coughing episodes from her history of pneumonia rating her depression a 3 on a scale of 1-10 but considered that her baseline.  She felt ready to go home and will continue with her medications.  On day of discharge, her sleep was good.  Her appetite was good.  Having depressive symptoms rating it a 3 on a  scale of 1-10.  She denied any suicidal or homicidal thoughts.  Denied any auditory hallucinations or delusional thinking.  She was having mild anxiety rating it a 3 on a scale of 1-10 and reporting no medication side effects.  She rated her hopelessness a 1 on a scale of 1-10.  DISCHARGE MEDICATIONS: 1. Norvasc 10 mg daily. 2. Avelox 400 mg daily, taking one for three more days, then to stop. 3. Oxycodone as needed for pain, 5/325 one q.i.d. 4. The patient was on a prednisone taper. 5. Alprazolam 1 mg taking 1/2 tablet at 8 and 2 and one whole pill at     bedtime if needed for anxiety. 6. Cymbalta 60 mg b.i.d. 7. Gabapentin 600 mg  one at 8, 2 and 10. 8. Abilify 10 mg daily. 9. Advair Diskus 250/50 one b.i.d.  Her follow-up was with [facility], phone number [phone].  The patient was to go to the walk-in clinic between the hours of 8 and 12.  She had appointment at [facility] with the walk-in clinic as well.  Phone number [phone].  She also had an appointment with Dr. Balinda Quails at [facility], phone number [phone] to recheck her respiratory status and her blood pressure and that was on September 21 at 9:30 in the morning.     Landry Corporal, N.P.   ______________________________ Franchot Gallo, MD    JO/MEDQ  D:  09/13/2011  T:  09/13/2011  Job:  [id number]  Electronically Signed by Limmie PatriciaP. on 09/19/2011 01:45:35 PM Electronically Signed by Franchot Gallo MD on 09/19/2011 06:15:24 PM

## 2011-09-21 LAB — URINALYSIS, ROUTINE W REFLEX MICROSCOPIC
Glucose, UA: NEGATIVE
Protein, ur: NEGATIVE
Specific Gravity, Urine: 1.023

## 2011-09-21 LAB — WET PREP, GENITAL: Yeast Wet Prep HPF POC: NONE SEEN

## 2011-09-21 LAB — URINE MICROSCOPIC-ADD ON

## 2011-09-22 LAB — URINALYSIS, ROUTINE W REFLEX MICROSCOPIC
Glucose, UA: NEGATIVE
Hgb urine dipstick: NEGATIVE
Ketones, ur: NEGATIVE
Protein, ur: NEGATIVE

## 2011-09-22 LAB — DIFFERENTIAL
Basophils Absolute: 0.1
Eosinophils Relative: 1
Lymphocytes Relative: 27
Monocytes Absolute: 0.6
Monocytes Relative: 5

## 2011-09-22 LAB — BASIC METABOLIC PANEL
CO2: 27
GFR calc Af Amer: >60
GFR calc non Af Amer: >60
Glucose, Bld: 92
Potassium: 3.2 — ABNORMAL LOW
Sodium: 139

## 2011-09-22 LAB — CBC
HCT: 43.2
Hemoglobin: 15.1 — ABNORMAL HIGH
RBC: 4.88
RDW: 14.8 — ABNORMAL HIGH

## 2011-09-22 LAB — GLUCOSE, RANDOM: Glucose, Bld: 89

## 2011-09-22 LAB — URINE MICROSCOPIC-ADD ON

## 2011-09-27 NOTE — Discharge Summary (Signed)
NAMEBERNADINE, [name] NO.:  [id number]  MEDICAL RECORD NO.:  [id number]  LOCATION:  1521                         FACILITY:  [facility]  PHYSICIAN:  Reginna Sermeno I Daimen Shovlin, MD      DATE OF BIRTH:  03/06/1976  DATE OF ADMISSION:  08/22/2011 DATE OF DISCHARGE:  08/27/2011                              DISCHARGE SUMMARY   PRIMARY CARE PHYSICIAN:  Evelene Croon, MD with [facility]  DISCHARGE DIAGNOSIS: 1. Depression with suicidal attempt. 2. Bipolar disorder. 3. Chronic mental health issue associated with psychosocial distress. 4. History of chronic obstructive pulmonary disease, on home oxygen     and CPAP. 5. Obesity ventilation syndrome, on CPAP at night. 6. Hypertension. 7. Obesity. 8. Ongoing tobacco abuse. 9. Sleep apnea. 10.Morbid obesity. 11.Hypertension. 12.Hyperlipidemia.  CONSULTATION:  Psych consulted.  DISCHARGE MEDICATIONS: 1. Albuterol nebs q.i.d. 2. Robitussin 5 mL q.4 hours as needed for 5 days. 3. Nicotine 14 mg q.24 hours daily as needed. 4. Prednisone taper dose 60 mg for 3 days, then 40 mg for 3 days, then     20 mg for 3 days, then stop. 5. Norvasc 10 mg p.o. daily. 6. Abilify 10 mg daily. 7. Advair Diskus 250/50 one puff twice daily. 8. Alprazolam 1 mg p.o. b.i.d. p.r.n. 9. Cymbalta 30 mg p.o. daily. 10.Neurontin 600 mg 3 times daily. 11.Gemfibrozil 600 mg daily. 12.Lisinopril 20 mg daily. 13.Oxycodone/Tylenol 10/325 four times daily. 14.QVAR inhaler 80 mcg two puffs twice daily. 15.Spiriva 18 mcg inhaler daily. 16.Albuterol inhaler 1 to 2 puffs q.4 hours as needed. 17.Vitamin D3 5000 units daily. 18.Avelox 400 mg p.o. daily for 5 days.  HISTORY OF PRESENT ILLNESS:  This is a 35 year old female with morbid obesity; COPD; and sleep apnea, on CPAP; presented with depression, crying.  She took 20 tablets of Soma and 10 tablets of Xanax yesterday. She reported that contacted Behavioral Health, contacted her primary care  physician.  She had a __________ with her primary care physician, as a result could not get to see him in time.  She has been feeling cough and shortness of breath.  She had a CT angiogram done which did show mild bibasilar opacities, atelectasis versus infiltrate and no evidence of PE. 1. Depression/bipolar with suicidal attempt.  The patient kept under     observation for 8 hours with no further consequence.  The patient     seen by Dr. Rogers Blocker, Psych and recommend Behavioral Health     admission. 2. Chronic obstructive pulmonary disease.  The patient seem at     baseline.  The patient will be treated as a case of community-     acquired pneumonia and COPD exacerbation with Avelox 400 mg p.o.     daily for 5 days to complete 7 days of treatment.  She needs to     continue her neb treatment and taper dose of prednisone.  Currently, we feel the patient is stable to be discharged to [facility].     [name], MD     HIE/MEDQ  D:  08/25/2011  T:  08/25/2011  Job:  [id number]  Electronically Signed by Ebony Cargo MD on 09/27/2011 11:48:20  AM

## 2012-01-19 ENCOUNTER — Encounter (HOSPITAL_COMMUNITY): Payer: Self-pay | Admitting: Emergency Medicine

## 2012-01-19 ENCOUNTER — Other Ambulatory Visit: Payer: Self-pay

## 2012-01-19 ENCOUNTER — Emergency Department (HOSPITAL_COMMUNITY)
Admission: EM | Admit: 2012-01-19 | Discharge: 2012-01-19 | Disposition: A | Discharge status: Home / Self Care | Payer: Medicaid Other | Attending: Emergency Medicine | Admitting: Emergency Medicine

## 2012-01-19 DIAGNOSIS — F319 Bipolar disorder, unspecified: Secondary | ICD-10-CM | POA: Insufficient documentation

## 2012-01-19 DIAGNOSIS — J45909 Unspecified asthma, uncomplicated: Secondary | ICD-10-CM | POA: Insufficient documentation

## 2012-01-19 DIAGNOSIS — M25519 Pain in unspecified shoulder: Secondary | ICD-10-CM | POA: Insufficient documentation

## 2012-01-19 DIAGNOSIS — M810 Age-related osteoporosis without current pathological fracture: Secondary | ICD-10-CM | POA: Insufficient documentation

## 2012-01-19 DIAGNOSIS — E785 Hyperlipidemia, unspecified: Secondary | ICD-10-CM | POA: Insufficient documentation

## 2012-01-19 DIAGNOSIS — G5690 Unspecified mononeuropathy of unspecified upper limb: Secondary | ICD-10-CM | POA: Insufficient documentation

## 2012-01-19 DIAGNOSIS — G5600 Carpal tunnel syndrome, unspecified upper limb: Secondary | ICD-10-CM | POA: Insufficient documentation

## 2012-01-19 DIAGNOSIS — R209 Unspecified disturbances of skin sensation: Secondary | ICD-10-CM | POA: Insufficient documentation

## 2012-01-19 DIAGNOSIS — I10 Essential (primary) hypertension: Secondary | ICD-10-CM | POA: Insufficient documentation

## 2012-01-19 DIAGNOSIS — R079 Chest pain, unspecified: Secondary | ICD-10-CM | POA: Insufficient documentation

## 2012-01-19 DIAGNOSIS — M7989 Other specified soft tissue disorders: Secondary | ICD-10-CM | POA: Insufficient documentation

## 2012-01-19 DIAGNOSIS — G629 Polyneuropathy, unspecified: Secondary | ICD-10-CM

## 2012-01-19 DIAGNOSIS — Z79899 Other long term (current) drug therapy: Secondary | ICD-10-CM | POA: Insufficient documentation

## 2012-01-19 NOTE — ED Provider Notes (Signed)
History     CSN: [id number]  Arrival date & time 01/19/12  1752   First MD Initiated Contact with Patient 01/19/12 1842      Chief Complaint  Patient presents with  . Chest Pain    (Consider location/radiation/quality/duration/timing/severity/associated sxs/prior treatment) HPI Patient presents to emergency room with chief complaint of left shoulder chest pain with some numbness radiating to left arm.  Patient also has some swelling to her left hand.  Patient states that the pain seems to be increased with abduction of the arm.  Patient denies pleurisy or shortness of breath.  Denies diaphoresis, nausea, vomiting.  Patient has no known history of coronary artery disease.  Patient does have history of carpal tunnel syndrome.  Patient states the pain awoke her last night.  She also has associated diagnoses of fibromyalgia.   Past Medical History  Diagnosis Date  . Asthma   . Anxiety   . Fibromyalgia   . Bipolar disorder   . HTN (hypertension)     ACEI cough   . HLD (hyperlipidemia)   . Osteoporosis   . Low back pain     2 ruptured discs in lower back   . Active smoker   . LVH (left ventricular hypertrophy)     echo (7/11) showed EF 70%, mild LVH, no sig valvular disease, moderate pericardial effussion. echo (9/11) showed no pericardial effusion, RF 65%, mild MR, normal RV size and systolic function.     Past Surgical History  Procedure Date  . Cesarean section     x3   . Bilateral hand surgery   . Tubligation     Family History  Problem Relation Age of Onset  . Coronary artery disease      family hx    History  Substance Use Topics  . Smoking status: Current Everyday Smoker  . Smokeless tobacco: Not on file   Comment: started in 1988; avg 1 ppd   . Alcohol Use: No    OB History    Grav Para Term Preterm Abortions TAB SAB Ect Mult Living                  Review of Systems  negative except as noted on history of present illness Allergies  Review of  patient's allergies indicates no known allergies.  Home Medications   Current Outpatient Rx  Name Route Sig Dispense Refill  . ALBUTEROL SULFATE (2.5 MG/3ML) 0.083% IN NEBU Nebulization Take 2.5 mg by nebulization every 4 (four) hours as needed. For wheezing    . ALPRAZOLAM 1 MG PO TABS Oral Take 1 mg by mouth 2 (two) times daily as needed. For anxiety    . VITAMIN D-3 5000 UNITS PO TABS Oral Take 1 tablet by mouth daily.      . DULOXETINE HCL 60 MG PO CPEP Oral Take 60 mg by mouth daily.      Marland Kitchen ESOMEPRAZOLE MAGNESIUM 40 MG PO CPDR Oral Take 40 mg by mouth daily.      Marland Kitchen FLUTICASONE-SALMETEROL 250-50 MCG/DOSE IN AEPB Inhalation Inhale 2 puffs into the lungs 2 (two) times daily as needed. For wheezing    . GABAPENTIN 600 MG PO TABS Oral Take 600 mg by mouth at bedtime.    . OXYCODONE-ACETAMINOPHEN 10-325 MG PO TABS Oral Take 1 tablet by mouth 4 (four) times daily.    Marland Kitchen TIOTROPIUM BROMIDE MONOHYDRATE 18 MCG IN CAPS Inhalation Place 18 mcg into inhaler and inhale daily.  BP 139/70  Pulse 86  Temp(Src) 97.7 F (36.5 C) (Oral)  Resp 18  SpO2 98%  Physical Exam  Nursing note and vitals reviewed. Constitutional: She is oriented to person, place, and time. She appears well-developed and well-nourished. No distress.  HENT:  Head: Normocephalic and atraumatic.  Eyes: Pupils are equal, round, and reactive to light.  Neck: Normal range of motion.  Cardiovascular: Normal rate and intact distal pulses.          Date: 01/19/2012  Rate: 90  Rhythm: normal sinus rhythm  QRS Axis: normal  Intervals: normal  ST/T Wave abnormalities: normal  Conduction Disutrbances:none and nonspecific intraventricular conduction delay:   Old EKG Reviewed: none available     Pulmonary/Chest: No respiratory distress.  Abdominal: Normal appearance. She exhibits no distension.  Musculoskeletal: Normal range of motion.       Arms: Neurological: She is alert and oriented to person, place, and time. No  cranial nerve deficit.  Skin: Skin is warm and dry. No rash noted.  Psychiatric: She has a normal mood and affect. Her behavior is normal.    ED Course  Procedures (including critical care time)  Labs Reviewed - No data to display No results found.   1. Carpal tunnel syndrome   2. Neuropathy       MDM          Nelia Shi, MD 01/19/12 [phone]

## 2012-01-19 NOTE — ED Notes (Signed)
Here with c/o L shoulder and arm pain that started at 0030 this am. States she was sleeping and it woke her from sleep. Pt denies injury.denies nausea or diaphoresis.

## 2012-01-19 NOTE — ED Notes (Signed)
Pt c/o left sided CP with radiation to left shoulder and arm that is worse with inspiration, movement and palpation; pt sts no more SOB than normal

## 2014-06-17 ENCOUNTER — Inpatient Hospital Stay (HOSPITAL_COMMUNITY)
Admission: EM | Admit: 2014-06-17 | Discharge: 2014-06-20 | DRG: 918 | Disposition: A | Discharge status: Other Acute Inpatient Hospital | Payer: Medicaid Other | Attending: Internal Medicine | Admitting: Internal Medicine

## 2014-06-17 ENCOUNTER — Encounter (HOSPITAL_COMMUNITY): Payer: Self-pay | Admitting: Emergency Medicine

## 2014-06-17 DIAGNOSIS — J45909 Unspecified asthma, uncomplicated: Secondary | ICD-10-CM | POA: Diagnosis present

## 2014-06-17 DIAGNOSIS — R0602 Shortness of breath: Secondary | ICD-10-CM

## 2014-06-17 DIAGNOSIS — G8929 Other chronic pain: Secondary | ICD-10-CM | POA: Diagnosis present

## 2014-06-17 DIAGNOSIS — T43294A Poisoning by other antidepressants, undetermined, initial encounter: Secondary | ICD-10-CM | POA: Diagnosis present

## 2014-06-17 DIAGNOSIS — Z5987 Material hardship due to limited financial resources, not elsewhere classified: Secondary | ICD-10-CM

## 2014-06-17 DIAGNOSIS — M545 Low back pain, unspecified: Secondary | ICD-10-CM | POA: Diagnosis present

## 2014-06-17 DIAGNOSIS — T398X2A Poisoning by other nonopioid analgesics and antipyretics, not elsewhere classified, intentional self-harm, initial encounter: Secondary | ICD-10-CM

## 2014-06-17 DIAGNOSIS — F17200 Nicotine dependence, unspecified, uncomplicated: Secondary | ICD-10-CM

## 2014-06-17 DIAGNOSIS — F3130 Bipolar disorder, current episode depressed, mild or moderate severity, unspecified: Secondary | ICD-10-CM

## 2014-06-17 DIAGNOSIS — R059 Cough, unspecified: Secondary | ICD-10-CM

## 2014-06-17 DIAGNOSIS — T43501A Poisoning by unspecified antipsychotics and neuroleptics, accidental (unintentional), initial encounter: Secondary | ICD-10-CM | POA: Diagnosis present

## 2014-06-17 DIAGNOSIS — R509 Fever, unspecified: Secondary | ICD-10-CM | POA: Diagnosis present

## 2014-06-17 DIAGNOSIS — J45901 Unspecified asthma with (acute) exacerbation: Secondary | ICD-10-CM

## 2014-06-17 DIAGNOSIS — T394X2A Poisoning by antirheumatics, not elsewhere classified, intentional self-harm, initial encounter: Secondary | ICD-10-CM | POA: Diagnosis present

## 2014-06-17 DIAGNOSIS — Z5989 Other problems related to housing and economic circumstances: Secondary | ICD-10-CM

## 2014-06-17 DIAGNOSIS — D72829 Elevated white blood cell count, unspecified: Secondary | ICD-10-CM | POA: Diagnosis present

## 2014-06-17 DIAGNOSIS — F411 Generalized anxiety disorder: Secondary | ICD-10-CM | POA: Diagnosis present

## 2014-06-17 DIAGNOSIS — T40601A Poisoning by unspecified narcotics, accidental (unintentional), initial encounter: Secondary | ICD-10-CM | POA: Diagnosis present

## 2014-06-17 DIAGNOSIS — Z8679 Personal history of other diseases of the circulatory system: Secondary | ICD-10-CM | POA: Diagnosis present

## 2014-06-17 DIAGNOSIS — G4730 Sleep apnea, unspecified: Secondary | ICD-10-CM

## 2014-06-17 DIAGNOSIS — T424X4A Poisoning by benzodiazepines, undetermined, initial encounter: Principal | ICD-10-CM | POA: Diagnosis present

## 2014-06-17 DIAGNOSIS — R05 Cough: Secondary | ICD-10-CM

## 2014-06-17 DIAGNOSIS — Z598 Other problems related to housing and economic circumstances: Secondary | ICD-10-CM

## 2014-06-17 DIAGNOSIS — D62 Acute posthemorrhagic anemia: Secondary | ICD-10-CM | POA: Diagnosis present

## 2014-06-17 DIAGNOSIS — Z8249 Family history of ischemic heart disease and other diseases of the circulatory system: Secondary | ICD-10-CM

## 2014-06-17 DIAGNOSIS — T50901A Poisoning by unspecified drugs, medicaments and biological substances, accidental (unintentional), initial encounter: Secondary | ICD-10-CM

## 2014-06-17 DIAGNOSIS — T43502A Poisoning by unspecified antipsychotics and neuroleptics, intentional self-harm, initial encounter: Secondary | ICD-10-CM | POA: Diagnosis present

## 2014-06-17 DIAGNOSIS — T50902A Poisoning by unspecified drugs, medicaments and biological substances, intentional self-harm, initial encounter: Secondary | ICD-10-CM

## 2014-06-17 DIAGNOSIS — E785 Hyperlipidemia, unspecified: Secondary | ICD-10-CM

## 2014-06-17 DIAGNOSIS — M810 Age-related osteoporosis without current pathological fracture: Secondary | ICD-10-CM | POA: Diagnosis present

## 2014-06-17 DIAGNOSIS — IMO0001 Reserved for inherently not codable concepts without codable children: Secondary | ICD-10-CM | POA: Diagnosis present

## 2014-06-17 DIAGNOSIS — E669 Obesity, unspecified: Secondary | ICD-10-CM | POA: Diagnosis present

## 2014-06-17 DIAGNOSIS — T438X2A Poisoning by other psychotropic drugs, intentional self-harm, initial encounter: Secondary | ICD-10-CM | POA: Diagnosis present

## 2014-06-17 DIAGNOSIS — I10 Essential (primary) hypertension: Secondary | ICD-10-CM

## 2014-06-17 DIAGNOSIS — Z6841 Body Mass Index (BMI) 40.0 and over, adult: Secondary | ICD-10-CM

## 2014-06-17 LAB — ACETAMINOPHEN LEVEL
Acetaminophen (Tylenol), Serum: <15 ug/mL (ref 10–30)
Acetaminophen (Tylenol), Serum: <15 ug/mL (ref 10–30)

## 2014-06-17 LAB — COMPREHENSIVE METABOLIC PANEL
ALK PHOS: 84 U/L (ref 39–117)
ALT: 14 U/L (ref 0–35)
AST: 15 U/L (ref 0–37)
Albumin: 3.4 g/dL — ABNORMAL LOW (ref 3.5–5.2)
Anion gap: 12 (ref 5–15)
BILIRUBIN TOTAL: 0.3 mg/dL (ref 0.3–1.2)
BUN: 5 mg/dL — ABNORMAL LOW (ref 6–23)
CHLORIDE: 101 meq/L (ref 96–112)
CO2: 27 meq/L (ref 19–32)
Calcium: 9.1 mg/dL (ref 8.4–10.5)
Creatinine, Ser: 0.71 mg/dL (ref 0.50–1.10)
GFR calc Af Amer: >90 mL/min (ref >90)
Glucose, Bld: 100 mg/dL — ABNORMAL HIGH (ref 70–99)
Potassium: 3.7 mEq/L (ref 3.7–5.3)
SODIUM: 140 meq/L (ref 137–147)
Total Protein: 6.5 g/dL (ref 6.0–8.3)

## 2014-06-17 LAB — CBC
HCT: 37.4 % (ref 36.0–46.0)
Hemoglobin: 11.4 g/dL — ABNORMAL LOW (ref 12.0–15.0)
MCH: 24 pg — ABNORMAL LOW (ref 26.0–34.0)
MCHC: 30.5 g/dL (ref 30.0–36.0)
MCV: 78.7 fL (ref 78.0–100.0)
PLATELETS: 270 10*3/uL (ref 150–400)
RBC: 4.75 MIL/uL (ref 3.87–5.11)
RDW: 16.3 % — ABNORMAL HIGH (ref 11.5–15.5)
WBC: 8.9 10*3/uL (ref 4.0–10.5)

## 2014-06-17 LAB — ETHANOL

## 2014-06-17 LAB — MAGNESIUM: Magnesium: 2 mg/dL (ref 1.5–2.5)

## 2014-06-17 LAB — SALICYLATE LEVEL: Salicylate Lvl: <2 mg/dL — ABNORMAL LOW (ref 2.8–20.0)

## 2014-06-17 LAB — MRSA PCR SCREENING: MRSA BY PCR: NEGATIVE

## 2014-06-17 MED ORDER — POTASSIUM CHLORIDE CRYS ER 20 MEQ PO TBCR
30.0000 meq | EXTENDED_RELEASE_TABLET | Freq: Once | ORAL | Status: AC
  Administered 2014-06-17: 30 meq via ORAL
  Filled 2014-06-17 (×2): qty 1

## 2014-06-17 MED ORDER — ALBUTEROL SULFATE (2.5 MG/3ML) 0.083% IN NEBU
2.5000 mg | INHALATION_SOLUTION | RESPIRATORY_TRACT | Status: DC | PRN
  Administered 2014-06-18: 2.5 mg via RESPIRATORY_TRACT
  Filled 2014-06-17 (×2): qty 3

## 2014-06-17 MED ORDER — MOMETASONE FURO-FORMOTEROL FUM 100-5 MCG/ACT IN AERO
2.0000 | INHALATION_SPRAY | Freq: Two times a day (BID) | RESPIRATORY_TRACT | Status: DC
  Administered 2014-06-18 – 2014-06-20 (×5): 2 via RESPIRATORY_TRACT
  Filled 2014-06-17: qty 8.8

## 2014-06-17 MED ORDER — CHLORHEXIDINE GLUCONATE 0.12 % MT SOLN
15.0000 mL | Freq: Two times a day (BID) | OROMUCOSAL | Status: DC
  Administered 2014-06-18 – 2014-06-20 (×4): 15 mL via OROMUCOSAL
  Filled 2014-06-17 (×7): qty 15

## 2014-06-17 MED ORDER — BIOTENE DRY MOUTH MT LIQD
15.0000 mL | Freq: Two times a day (BID) | OROMUCOSAL | Status: DC
Start: 2014-06-18 — End: 2014-06-20
  Administered 2014-06-18 – 2014-06-19 (×4): 15 mL via OROMUCOSAL

## 2014-06-17 MED ORDER — NALOXONE HCL 0.4 MG/ML IJ SOLN
0.4000 mg | Freq: Once | INTRAMUSCULAR | Status: AC
  Administered 2014-06-17: 0.4 mg via INTRAVENOUS
  Filled 2014-06-17: qty 1

## 2014-06-17 MED ORDER — TIOTROPIUM BROMIDE MONOHYDRATE 18 MCG IN CAPS
18.0000 ug | ORAL_CAPSULE | Freq: Every day | RESPIRATORY_TRACT | Status: DC
  Administered 2014-06-18 – 2014-06-20 (×3): 18 ug via RESPIRATORY_TRACT
  Filled 2014-06-17: qty 5

## 2014-06-17 MED ORDER — ENOXAPARIN SODIUM 80 MG/0.8ML ~~LOC~~ SOLN
70.0000 mg | SUBCUTANEOUS | Status: DC
  Administered 2014-06-17 – 2014-06-19 (×3): 70 mg via SUBCUTANEOUS
  Filled 2014-06-17 (×4): qty 0.8

## 2014-06-17 MED ORDER — CHLORHEXIDINE GLUCONATE 0.12 % MT SOLN: 15.0000 mL | Freq: Two times a day (BID) | OROMUCOSAL | Status: DC

## 2014-06-17 MED ORDER — SODIUM CHLORIDE 0.9 % IV SOLN
INTRAVENOUS | Status: DC
  Administered 2014-06-17: 17:00:00 via INTRAVENOUS
  Administered 2014-06-18: 75 mL/h via INTRAVENOUS
  Administered 2014-06-18 – 2014-06-19 (×2): via INTRAVENOUS

## 2014-06-17 MED ORDER — ENOXAPARIN SODIUM 40 MG/0.4ML ~~LOC~~ SOLN
40.0000 mg | SUBCUTANEOUS | Status: DC
  Filled 2014-06-17: qty 0.4

## 2014-06-17 NOTE — ED Notes (Signed)
Per [facility] pt took unknown amount of percocet 10-352 (bottle filled on 6/24 150 tablets), clonazepam 0.5 mg, venlafaxine 37.5mg , seroquel 300 mg in attempt to kill herself.

## 2014-06-17 NOTE — ED Notes (Signed)
Bed: RESB Expected date:  Expected time:  Means of arrival:  Comments: EMS-OD 

## 2014-06-17 NOTE — H&P (Addendum)
History and Physical   Alyssa Truman HaywardM [name] GNF:[id number] DOB: November 16, 1976 DOA: 06/17/2014  Referring physician: Dr. Gwendolyn GrantWalden PCP: PROVIDER NOT IN SYSTEM  Specialists: psychiatry   Chief Complaint: drug overdose  HPI: Alyssa [name] is a 38 y.o. female has a past medical history significant for Anxiety, bipolar disorder, HLD, obesity, tobacco abuse, HTN, [facility] ingesting multiple prescription medication with suicide intent. Patient does not wish to answer my questions stating "I already told everyone else its in my chart what I took". Per chart review she took an unknown amount of Percocet (apparently had ~10 left), clonazepam, Effexor and Seroquel. She took these medications about 4 hours prior to my evaluation. Patient wakes up some to my questions and denies chest pain, breathing difficulties, has no abdominal pain, nausea or vomiting.   Review of Systems: unable to obtain full ROS due to lethargy   Past Medical History  Diagnosis Date  . Asthma   . Anxiety   . Fibromyalgia   . Bipolar disorder   . HTN (hypertension)     ACEI cough   . HLD (hyperlipidemia)   . Osteoporosis   . Low back pain     2 ruptured discs in lower back   . Active smoker   . LVH (left ventricular hypertrophy)     echo (7/11) showed EF 70%, mild LVH, no sig valvular disease, moderate pericardial effussion. echo (9/11) showed no pericardial effusion, RF 65%, mild MR, normal RV size and systolic function.    Past Surgical History  Procedure Laterality Date  . Cesarean section      x3   . Bilateral hand surgery    . Tubligation     Social History:  reports that she has been smoking.  She does not have any smokeless tobacco history on file. She reports that she does not drink alcohol or use illicit drugs.  No Known Allergies  Family History  Problem Relation Age of Onset  . Coronary artery disease      family hx   Prior to Admission medications   Medication Sig Start Date End Date  Taking? Authorizing Provider  albuterol (PROVENTIL) (2.5 MG/3ML) 0.083% nebulizer solution Take 2.5 mg by nebulization every 4 (four) hours as needed for wheezing or shortness of breath.    Yes Historical Provider, MD  Cholecalciferol (VITAMIN D-3) 5000 UNITS TABS Take 1 tablet by mouth every morning.    Yes Historical Provider, MD  clonazePAM (KLONOPIN) 0.5 MG tablet Take 0.5 mg by mouth 2 (two) times daily.   Yes Historical Provider, MD  esomeprazole (NEXIUM) 40 MG capsule Take 40 mg by mouth every morning.    Yes Historical Provider, MD  Fluticasone-Salmeterol (ADVAIR DISKUS) 250-50 MCG/DOSE AEPB Inhale 2 puffs into the lungs 2 (two) times daily as needed (wheezing, SOB).    Yes Historical Provider, MD  gabapentin (NEURONTIN) 600 MG tablet Take 600 mg by mouth at bedtime.   Yes Historical Provider, MD  oxyCODONE-acetaminophen (PERCOCET) 10-325 MG per tablet Take 1 tablet by mouth every 6 (six) hours as needed for pain.    Yes Historical Provider, MD  potassium chloride (K-DUR) 10 MEQ tablet Take 10 mEq by mouth every morning.    Yes Historical Provider, MD  QUEtiapine (SEROQUEL XR) 300 MG 24 hr tablet Take 300 mg by mouth at bedtime.   Yes Historical Provider, MD  tiotropium (SPIRIVA) 18 MCG inhalation capsule Place 18 mcg into inhaler and inhale daily as needed (wheezing, SOB).  Yes Historical Provider, MD  venlafaxine (EFFEXOR) 37.5 MG tablet Take 37.5 mg by mouth every evening.   Yes Historical Provider, MD   Physical Exam: Filed Vitals:   06/17/14 1320 06/17/14 1321 06/17/14 1405 06/17/14 1510  BP: 111/72  104/65 118/73  Pulse: 101  91 88  Temp: 97.6 F (36.4 C)   98.2 F (36.8 C)  TempSrc: Axillary   Oral  Resp: 14  21 20   SpO2: 88% 98% 96% 97%    General:  No apparent distress, sleeping, wakes up to give me some answers  Eyes: no scleral icterus  ENT: moist oropharynx  Neck: supple, no JVD  Cardiovascular: regular rate without MRG; 2+ peripheral pulses  Respiratory: CTA  biL, good air movement without wheezing, rhonchi or crackled  Abdomen: soft, non tender to palpation, positive bowel sounds, no guarding, no rebound  Skin: no rashes  Musculoskeletal: no peripheral edema  Psychiatric: normal mood and affect  Neurologic: lethargic  Labs on Admission:  Basic Metabolic Panel:  Recent Labs Lab 06/17/14 1341  NA 140  K 3.7  CL 101  CO2 27  GLUCOSE 100*  BUN 5*  CREATININE 0.71  CALCIUM 9.1  MG 2.0   Liver Function Tests:  Recent Labs Lab 06/17/14 1341  AST 15  ALT 14  ALKPHOS 84  BILITOT 0.3  PROT 6.5  ALBUMIN 3.4*   CBC:  Recent Labs Lab 06/17/14 1341  WBC 8.9  HGB 11.4*  HCT 37.4  MCV 78.7  PLT 270   EKG: Independently reviewed. Sinus rhythm, QT prolongation, poor tracing, will repeat   Assessment/Plan Principal Problem:   Drug overdose Active Problems:   HYPERLIPIDEMIA   TOBACCO ABUSE   HYPERTENSION   SLEEP APNEA   Overdose   Drug overdose - will admit patient and monitor in SDU, EDP discussed with poison control and recommended admission for observation. Tylenol, salicylates, alcohol levels normal.  - because of percocet, will repeat tonight tylenol leves - supportive treatment, oxygen as needed, IVF - psychiatry consulted, sitter at bedside Prolonged QT interval - previous tracings with QTC ~450, EKG now shows 542, monitor in SDU, K 3.7, Mg 2.0. Avoid QT prolonging agents.  - repeat EKG now and in am as poor tracing in the ED Tobacco abuse - will need counseling when she wakes up Alcohol use - I started CIWA without Ativan given lethargy. RN to page MD CIWA indicated need for ativan.  Chronic pain - hold all pain meds Asthma - continue inhalers  Addendum: reviewed EKG after SDU admission, QTc 465.  Diet: NPO Fluids: NS DVT Prophylaxis: Lovenox  Code Status: Full  Family Communication: d/w daughter in law   Disposition Plan: observation  Time spent: 4150  This note has been created with Engineer, agriculturalDragon speech  recognition software and smart phrase technology. Any transcriptional errors are unintentional.   Costin M. Elvera LennoxGherghe, MD [facility] Pager [phone]  If 7PM-7AM, please contact night-coverage [email or web address] Password [email or web address] 06/17/2014, 3:30 PM

## 2014-06-17 NOTE — ED Provider Notes (Signed)
CSN: [id number]     Arrival date & time 06/17/14  1311 History   First MD Initiated Contact with Patient 06/17/14 1316     Chief Complaint  Patient presents with  . Suicide Attempt     (Consider location/radiation/quality/duration/timing/severity/associated sxs/prior Treatment) Patient is a 38 y.o. female presenting with Overdose. The history is provided by the patient.  Drug Overdose This is a recurrent problem. The current episode started 1 to 2 hours ago. The problem occurs constantly. The problem has not changed since onset.Pertinent negatives include no chest pain, no abdominal pain and no shortness of breath. Nothing aggravates the symptoms. Nothing relieves the symptoms.    Past Medical History  Diagnosis Date  . Asthma   . Anxiety   . Fibromyalgia   . Bipolar disorder   . HTN (hypertension)     ACEI cough   . HLD (hyperlipidemia)   . Osteoporosis   . Low back pain     2 ruptured discs in lower back   . Active smoker   . LVH (left ventricular hypertrophy)     echo (7/11) showed EF 70%, mild LVH, no sig valvular disease, moderate pericardial effussion. echo (9/11) showed no pericardial effusion, RF 65%, mild MR, normal RV size and systolic function.    Past Surgical History  Procedure Laterality Date  . Cesarean section      x3   . Bilateral hand surgery    . Tubligation     Family History  Problem Relation Age of Onset  . Coronary artery disease      family hx   History  Substance Use Topics  . Smoking status: Current Every Day Smoker  . Smokeless tobacco: Not on file     Comment: started in 1988; avg 1 ppd   . Alcohol Use: No   OB History   Grav Para Term Preterm Abortions TAB SAB Ect Mult Living                 Review of Systems  Constitutional: Negative for fever.  Respiratory: Negative for cough and shortness of breath.   Cardiovascular: Negative for chest pain.  Gastrointestinal: Negative for abdominal pain.  All other systems reviewed and are  negative.     Allergies  Review of patient's allergies indicates no known allergies.  Home Medications   Prior to Admission medications   Medication Sig Start Date End Date Taking? Authorizing Provider  albuterol (PROVENTIL) (2.5 MG/3ML) 0.083% nebulizer solution Take 2.5 mg by nebulization every 4 (four) hours as needed. For wheezing    Historical Provider, MD  ALPRAZolam Prudy Feeler(XANAX) 1 MG tablet Take 1 mg by mouth 2 (two) times daily as needed. For anxiety    Historical Provider, MD  Cholecalciferol (VITAMIN D-3) 5000 UNITS TABS Take 1 tablet by mouth daily.      Historical Provider, MD  DULoxetine (CYMBALTA) 60 MG capsule Take 60 mg by mouth daily.      Historical Provider, MD  esomeprazole (NEXIUM) 40 MG capsule Take 40 mg by mouth daily.      Historical Provider, MD  Fluticasone-Salmeterol (ADVAIR DISKUS) 250-50 MCG/DOSE AEPB Inhale 2 puffs into the lungs 2 (two) times daily as needed. For wheezing    Historical Provider, MD  gabapentin (NEURONTIN) 600 MG tablet Take 600 mg by mouth at bedtime.    Historical Provider, MD  oxyCODONE-acetaminophen (PERCOCET) 10-325 MG per tablet Take 1 tablet by mouth 4 (four) times daily.    Historical Provider, MD  tiotropium (  SPIRIVA) 18 MCG inhalation capsule Place 18 mcg into inhaler and inhale daily.      Historical Provider, MD   SpO2 98% Physical Exam  Nursing note and vitals reviewed. Constitutional: She is oriented to person, place, and time. She appears well-developed and well-nourished. She appears lethargic. No distress.  HENT:  Head: Normocephalic and atraumatic.  Eyes: EOM are normal. Pupils are equal, round, and reactive to light.  Neck: Normal range of motion. Neck supple.  Cardiovascular: Normal rate and regular rhythm.  Exam reveals no friction rub.   No murmur heard. Pulmonary/Chest: Effort normal and breath sounds normal. No respiratory distress. She has no wheezes. She has no rales.  Abdominal: Soft. She exhibits no distension.  There is no tenderness. There is no rebound.  Musculoskeletal: Normal range of motion. She exhibits no edema.  Neurological: She is oriented to person, place, and time. She appears lethargic. No cranial nerve deficit. She exhibits normal muscle tone. Coordination normal. GCS eye subscore is 4. GCS verbal subscore is 5. GCS motor subscore is 6.  Skin: She is not diaphoretic.    ED Course  Procedures (including critical care time) Labs Review Labs Reviewed  CBC  COMPREHENSIVE METABOLIC PANEL  URINALYSIS, ROUTINE W REFLEX MICROSCOPIC  ETHANOL  ACETAMINOPHEN LEVEL  SALICYLATE LEVEL  URINE RAPID DRUG SCREEN (HOSP PERFORMED)    Imaging Review No results found.   EKG Interpretation None      MDM   Final diagnoses:  None    54F here s/p suicide attempt with pills. Took about 12 klonopin, 8 oxycodone, 5-6 effexor, and 4 seroquel. Per EMS, these numbers were different per the patient, so truly unknown what she took. Boyfriend states he grabbed some pills from her hand and threw them away. Per him, the pills were taken about 2 hours ago.  Patient sleepy, but awakens easily to speak to me. Moving all extremities. EKG ok, normal QRS. Labs ok. Poison Control recommends admission due to Effexor. Dr. Elvera LennoxGherghe admitting.   Alyssa HaitWilliam Lorn Butcher, MD 06/17/14 [phone]

## 2014-06-18 DIAGNOSIS — R059 Cough, unspecified: Secondary | ICD-10-CM

## 2014-06-18 DIAGNOSIS — F3130 Bipolar disorder, current episode depressed, mild or moderate severity, unspecified: Secondary | ICD-10-CM

## 2014-06-18 DIAGNOSIS — R45851 Suicidal ideations: Secondary | ICD-10-CM

## 2014-06-18 DIAGNOSIS — R05 Cough: Secondary | ICD-10-CM

## 2014-06-18 LAB — RAPID URINE DRUG SCREEN, HOSP PERFORMED
Amphetamines: NOT DETECTED
Barbiturates: NOT DETECTED
Benzodiazepines: POSITIVE — AB
COCAINE: NOT DETECTED
OPIATES: NOT DETECTED
TETRAHYDROCANNABINOL: NOT DETECTED

## 2014-06-18 LAB — COMPREHENSIVE METABOLIC PANEL
ALT: 12 U/L (ref 0–35)
AST: 13 U/L (ref 0–37)
Albumin: 3 g/dL — ABNORMAL LOW (ref 3.5–5.2)
Alkaline Phosphatase: 78 U/L (ref 39–117)
Anion gap: 9 (ref 5–15)
BILIRUBIN TOTAL: 0.4 mg/dL (ref 0.3–1.2)
BUN: 7 mg/dL (ref 6–23)
CO2: 28 mEq/L (ref 19–32)
CREATININE: 0.84 mg/dL (ref 0.50–1.10)
Calcium: 8.4 mg/dL (ref 8.4–10.5)
Chloride: 104 mEq/L (ref 96–112)
GFR calc Af Amer: >90 mL/min (ref >90)
GFR calc non Af Amer: 87 mL/min — ABNORMAL LOW (ref >90)
Glucose, Bld: 98 mg/dL (ref 70–99)
Potassium: 4.1 mEq/L (ref 3.7–5.3)
SODIUM: 141 meq/L (ref 137–147)
Total Protein: 6.2 g/dL (ref 6.0–8.3)

## 2014-06-18 LAB — CBC
HEMATOCRIT: 36.2 % (ref 36.0–46.0)
Hemoglobin: 10.7 g/dL — ABNORMAL LOW (ref 12.0–15.0)
MCH: 23.7 pg — ABNORMAL LOW (ref 26.0–34.0)
MCHC: 29.6 g/dL — ABNORMAL LOW (ref 30.0–36.0)
MCV: 80.3 fL (ref 78.0–100.0)
Platelets: 238 10*3/uL (ref 150–400)
RBC: 4.51 MIL/uL (ref 3.87–5.11)
RDW: 16.7 % — AB (ref 11.5–15.5)
WBC: 8.7 10*3/uL (ref 4.0–10.5)

## 2014-06-18 LAB — URINALYSIS, ROUTINE W REFLEX MICROSCOPIC
Bilirubin Urine: NEGATIVE
GLUCOSE, UA: NEGATIVE mg/dL
HGB URINE DIPSTICK: NEGATIVE
KETONES UR: NEGATIVE mg/dL
Leukocytes, UA: NEGATIVE
Nitrite: POSITIVE — AB
PROTEIN: NEGATIVE mg/dL
Specific Gravity, Urine: 1.026 (ref 1.005–1.030)
Urobilinogen, UA: 0.2 mg/dL (ref 0.0–1.0)
pH: 5.5 (ref 5.0–8.0)

## 2014-06-18 LAB — URINE MICROSCOPIC-ADD ON

## 2014-06-18 LAB — PROTIME-INR
INR: 1.01 (ref 0.00–1.49)
Prothrombin Time: 13.3 seconds (ref 11.6–15.2)

## 2014-06-18 LAB — TROPONIN I

## 2014-06-18 MED ORDER — TRAMADOL HCL 50 MG PO TABS
50.0000 mg | ORAL_TABLET | Freq: Four times a day (QID) | ORAL | Status: DC | PRN
  Administered 2014-06-18 – 2014-06-19 (×4): 50 mg via ORAL
  Filled 2014-06-18 (×4): qty 1

## 2014-06-18 MED ORDER — HYDRALAZINE HCL 20 MG/ML IJ SOLN
5.0000 mg | Freq: Four times a day (QID) | INTRAMUSCULAR | Status: DC | PRN
  Administered 2014-06-19: 5 mg via INTRAVENOUS
  Filled 2014-06-18: qty 0.25

## 2014-06-18 MED ORDER — NICOTINE 21 MG/24HR TD PT24
21.0000 mg | MEDICATED_PATCH | Freq: Every day | TRANSDERMAL | Status: DC
  Administered 2014-06-18 – 2014-06-20 (×3): 21 mg via TRANSDERMAL
  Filled 2014-06-18 (×3): qty 1

## 2014-06-18 MED ORDER — CLONAZEPAM 0.5 MG PO TABS
0.5000 mg | ORAL_TABLET | Freq: Once | ORAL | Status: AC
  Administered 2014-06-18: 0.5 mg via ORAL
  Filled 2014-06-18: qty 1

## 2014-06-18 MED ORDER — LIDOCAINE 5 % EX PTCH
1.0000 | MEDICATED_PATCH | CUTANEOUS | Status: DC
  Administered 2014-06-18 – 2014-06-19 (×2): 1 via TRANSDERMAL
  Filled 2014-06-18 (×3): qty 1

## 2014-06-18 MED ORDER — HYDRALAZINE HCL 10 MG PO TABS
10.0000 mg | ORAL_TABLET | Freq: Three times a day (TID) | ORAL | Status: DC
  Administered 2014-06-18 – 2014-06-20 (×6): 10 mg via ORAL
  Filled 2014-06-18 (×9): qty 1

## 2014-06-18 NOTE — Progress Notes (Signed)
Patient awake, family and patient asking many questions regarding medications and her status.  Complains of back pain at 7/10 and chest pain at 8/10.  The back pain is constant and the chest pain comes and goes similar to what she was having previously in the stepdown unit.  Also would like a nicotine patch.  No pain medications ordered at this time, paged Dr. Izola PriceMyers

## 2014-06-18 NOTE — Consult Note (Signed)
[facility] Face-to-Face Psychiatry Consult   Reason for Consult:  Overdose with Suicidal Intent Referring Physician:  Caren Griffins, MD   Alyssa [name] is an 38 y.o. female. Total Time spent with patient: 45 minutes  Assessment: AXIS I:  Bipolar, Depressed AXIS II:  Deferred AXIS III:   Past Medical History  Diagnosis Date  . Asthma   . Anxiety   . Fibromyalgia   . Bipolar disorder   . HTN (hypertension)     ACEI cough   . HLD (hyperlipidemia)   . Osteoporosis   . Low back pain     2 ruptured discs in lower back   . Active smoker   . LVH (left ventricular hypertrophy)     echo (7/11) showed EF 70%, mild LVH, no sig valvular disease, moderate pericardial effussion. echo (9/11) showed no pericardial effusion, RF 65%, mild MR, normal RV size and systolic function.    AXIS IV:  economic problems, occupational problems, other psychosocial or environmental problems, problems related to social environment and problems with primary support group AXIS V:  41-50 serious symptoms  Plan: Paged Dr. Doyle Askew, MD, hold medication for now and may start when medically cleared Recommend psychiatric Inpatient admission when medically cleared. Supportive therapy provided about ongoing stressors. Appreciate psychiatric consultation and follow up as clinically required Please contact [phone] if needs further assistance  Subjective:   Alyssa [name] is a 38 y.o. female patient admitted with suicidal overdose.  HPI: Patient is seen, chart reviewed and case discussed with Alyssa Messing, LCSW. Patient is poorly cooperative. Will ask case management to contact DSS due to suicidal attempt while minors at home. Psychiatric consultation called in for bipolar depression and status post suicidal attempt with multiple psychotropic medication with intention to end her life but she is minimizes her intent at this time, patient has multiple psychosocial stresses. She has been thinking about suicide about a month  prior to the incident and her Friend removed some of the medication from her hand. She blames her friend and sister who contacted emergency medical services and has no regrets about the attempt. 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 She has been suffering with depression, irritability, disturbed sleep and appetite. She has difficult time to manage her two younger children at home (24 and 77 years old). Patient stated that she does not want to be bothered and wants to be alone. She has taken medication from [facility] and denied having psychiatric services, her medications are seroquel, effexor, neurontin and clonazepam. She has multiple previous psych admission and last one at [facility] is 2012 for similar clinical presentation. She has oxygen supply by canula and states she uses CPAP at home during night only. UDS is positive for benzo's.  Medical history:  Alyssa [name] is a 38 y.o. female has a past medical history significant for Anxiety, bipolar disorder, HLD, obesity, tobacco abuse, HTN, presents to [facility] with CC of ingesting multiple prescription medication with suicide intent. Patient does not wish to answer my questions stating "I already told everyone else its in my chart what I took". Per chart review she took an unknown amount of Percocet (apparently had ~10 left), clonazepam, Effexor and Seroquel. She took these medications about 4 hours prior to my evaluation. Patient wakes up some to my questions and denies chest pain, breathing difficulties, has no abdominal pain, nausea or vomiting.   Review of  Systems: unable to obtain full ROS due to lethargy   HPI Elements:   Location:  depression. Quality:  poor. Severity:  acut4. Timing:  unknown.  Past Psychiatric History: Past Medical History  Diagnosis Date  . Asthma   . Anxiety   . Fibromyalgia   . Bipolar disorder   . HTN (hypertension)     ACEI cough   . HLD  (hyperlipidemia)   . Osteoporosis   . Low back pain     2 ruptured discs in lower back   . Active smoker   . LVH (left ventricular hypertrophy)     echo (7/11) showed EF 70%, mild LVH, no sig valvular disease, moderate pericardial effussion. echo (9/11) showed no pericardial effusion, RF 65%, mild MR, normal RV size and systolic function.     reports that she has been smoking.  She has never used smokeless tobacco. She reports that she does not drink alcohol or use illicit drugs. Family History  Problem Relation Age of Onset  . Coronary artery disease      family hx     Living Arrangements: Children   Abuse/Neglect [facility]) Physical Abuse: Denies Verbal Abuse: Denies Sexual Abuse: Denies Allergies:  No Known Allergies  ACT Assessment Complete:  NO Objective: Blood pressure 121/79, pulse 60, temperature 99.7 F (37.6 C), temperature source Oral, resp. rate 16, height _0  (1.702 m), weight 145.5 kg (320 lb 12.3 oz), SpO2 96.00%.Body mass index is 50.23 kg/(m^2). Results for orders placed during the hospital encounter of 06/17/14 (from the past 72 hour(s))  CBC     Status: Abnormal   Collection Time    06/17/14  1:41 PM      Result Value Ref Range   WBC 8.9  4.0 - 10.5 K/uL   RBC 4.75  3.87 - 5.11 MIL/uL   Hemoglobin 11.4 (*) 12.0 - 15.0 g/dL   HCT 37.4  36.0 - 46.0 %   MCV 78.7  78.0 - 100.0 fL   MCH 24.0 (*) 26.0 - 34.0 pg   MCHC 30.5  30.0 - 36.0 g/dL   RDW 16.3 (*) 11.5 - 15.5 %   Platelets 270  150 - 400 K/uL  COMPREHENSIVE METABOLIC PANEL     Status: Abnormal   Collection Time    06/17/14  1:41 PM      Result Value Ref Range   Sodium 140  137 - 147 mEq/L   Potassium 3.7  3.7 - 5.3 mEq/L   Chloride 101  96 - 112 mEq/L   CO2 27  19 - 32 mEq/L   Glucose, Bld 100 (*) 70 - 99 mg/dL   BUN 5 (*) 6 - 23 mg/dL   Creatinine, Ser 0.71  0.50 - 1.10 mg/dL   Calcium 9.1  8.4 - 10.5 mg/dL   Total Protein 6.5  6.0 - 8.3 g/dL   Albumin 3.4 (*) 3.5 - 5.2 g/dL   AST 15  0 - 37  U/L   ALT 14  0 - 35 U/L   Alkaline Phosphatase 84  39 - 117 U/L   Total Bilirubin 0.3  0.3 - 1.2 mg/dL   GFR calc non Af Amer >90  >90 mL/min   GFR calc Af Amer >90  >90 mL/min   Comment: (NOTE)     The eGFR has been calculated using the CKD EPI equation.     This calculation has not been validated in all clinical situations.     eGFR's persistently <90 mL/min signify  possible Chronic Kidney     Disease.   Anion gap 12  5 - 15  ETHANOL     Status: None   Collection Time    06/17/14  1:41 PM      Result Value Ref Range   Alcohol, Ethyl (B) <11  0 - 11 mg/dL   Comment:            LOWEST DETECTABLE LIMIT FOR     SERUM ALCOHOL IS 11 mg/dL     FOR MEDICAL PURPOSES ONLY  ACETAMINOPHEN LEVEL     Status: None   Collection Time    06/17/14  1:41 PM      Result Value Ref Range   Acetaminophen (Tylenol), Serum <15.0  10 - 30 ug/mL   Comment:            THERAPEUTIC CONCENTRATIONS VARY     SIGNIFICANTLY. A RANGE OF 10-30     ug/mL MAY BE AN EFFECTIVE     CONCENTRATION FOR MANY PATIENTS.     HOWEVER, SOME ARE BEST TREATED     AT CONCENTRATIONS OUTSIDE THIS     RANGE.     ACETAMINOPHEN CONCENTRATIONS     >150 ug/mL AT 4 HOURS AFTER     INGESTION AND >50 ug/mL AT 12     HOURS AFTER INGESTION ARE     OFTEN ASSOCIATED WITH TOXIC     REACTIONS.  SALICYLATE LEVEL     Status: Abnormal   Collection Time    06/17/14  1:41 PM      Result Value Ref Range   Salicylate Lvl <8.8 (*) 2.8 - 20.0 mg/dL  MAGNESIUM     Status: None   Collection Time    06/17/14  1:41 PM      Result Value Ref Range   Magnesium 2.0  1.5 - 2.5 mg/dL  MRSA PCR SCREENING     Status: None   Collection Time    06/17/14  4:31 PM      Result Value Ref Range   MRSA by PCR NEGATIVE  NEGATIVE   Comment:            The GeneXpert MRSA Assay (FDA     approved for NASAL specimens     only), is one component of a     comprehensive MRSA colonization     surveillance program. It is not     intended to diagnose MRSA      infection nor to guide or     monitor treatment for     MRSA infections.  ACETAMINOPHEN LEVEL     Status: None   Collection Time    06/17/14  9:32 PM      Result Value Ref Range   Acetaminophen (Tylenol), Serum <15.0  10 - 30 ug/mL   Comment:            THERAPEUTIC CONCENTRATIONS VARY     SIGNIFICANTLY. A RANGE OF 10-30     ug/mL MAY BE AN EFFECTIVE     CONCENTRATION FOR MANY PATIENTS.     HOWEVER, SOME ARE BEST TREATED     AT CONCENTRATIONS OUTSIDE THIS     RANGE.     ACETAMINOPHEN CONCENTRATIONS     >150 ug/mL AT 4 HOURS AFTER     INGESTION AND >50 ug/mL AT 12     HOURS AFTER INGESTION ARE     OFTEN ASSOCIATED WITH TOXIC     REACTIONS.  COMPREHENSIVE METABOLIC PANEL  Status: Abnormal   Collection Time    06/18/14  3:45 AM      Result Value Ref Range   Sodium 141  137 - 147 mEq/L   Potassium 4.1  3.7 - 5.3 mEq/L   Chloride 104  96 - 112 mEq/L   CO2 28  19 - 32 mEq/L   Glucose, Bld 98  70 - 99 mg/dL   BUN 7  6 - 23 mg/dL   Creatinine, Ser 0.84  0.50 - 1.10 mg/dL   Calcium 8.4  8.4 - 10.5 mg/dL   Total Protein 6.2  6.0 - 8.3 g/dL   Albumin 3.0 (*) 3.5 - 5.2 g/dL   AST 13  0 - 37 U/L   ALT 12  0 - 35 U/L   Alkaline Phosphatase 78  39 - 117 U/L   Total Bilirubin 0.4  0.3 - 1.2 mg/dL   GFR calc non Af Amer 87 (*) >90 mL/min   GFR calc Af Amer >90  >90 mL/min   Comment: (NOTE)     The eGFR has been calculated using the CKD EPI equation.     This calculation has not been validated in all clinical situations.     eGFR's persistently <90 mL/min signify possible Chronic Kidney     Disease.   Anion gap 9  5 - 15  CBC     Status: Abnormal   Collection Time    06/18/14  3:45 AM      Result Value Ref Range   WBC 8.7  4.0 - 10.5 K/uL   RBC 4.51  3.87 - 5.11 MIL/uL   Hemoglobin 10.7 (*) 12.0 - 15.0 g/dL   HCT 36.2  36.0 - 46.0 %   MCV 80.3  78.0 - 100.0 fL   MCH 23.7 (*) 26.0 - 34.0 pg   MCHC 29.6 (*) 30.0 - 36.0 g/dL   RDW 16.7 (*) 11.5 - 15.5 %   Platelets 238  150 -  400 K/uL  PROTIME-INR     Status: None   Collection Time    06/18/14  3:45 AM      Result Value Ref Range   Prothrombin Time 13.3  11.6 - 15.2 seconds   INR 1.01  0.00 - 1.49  URINALYSIS, ROUTINE W REFLEX MICROSCOPIC     Status: Abnormal   Collection Time    06/18/14  6:19 AM      Result Value Ref Range   Color, Urine AMBER (*) YELLOW   Comment: BIOCHEMICALS MAY BE AFFECTED BY COLOR   APPearance CLOUDY (*) CLEAR   Specific Gravity, Urine 1.026  1.005 - 1.030   pH 5.5  5.0 - 8.0   Glucose, UA NEGATIVE  NEGATIVE mg/dL   Hgb urine dipstick NEGATIVE  NEGATIVE   Bilirubin Urine NEGATIVE  NEGATIVE   Ketones, ur NEGATIVE  NEGATIVE mg/dL   Protein, ur NEGATIVE  NEGATIVE mg/dL   Urobilinogen, UA 0.2  0.0 - 1.0 mg/dL   Nitrite POSITIVE (*) NEGATIVE   Leukocytes, UA NEGATIVE  NEGATIVE  URINE RAPID DRUG SCREEN (HOSP PERFORMED)     Status: Abnormal   Collection Time    06/18/14  6:19 AM      Result Value Ref Range   Opiates NONE DETECTED  NONE DETECTED   Cocaine NONE DETECTED  NONE DETECTED   Benzodiazepines POSITIVE (*) NONE DETECTED   Amphetamines NONE DETECTED  NONE DETECTED   Tetrahydrocannabinol NONE DETECTED  NONE DETECTED   Barbiturates NONE DETECTED  NONE DETECTED   Comment:            DRUG SCREEN FOR MEDICAL PURPOSES     ONLY.  IF CONFIRMATION IS NEEDED     FOR ANY PURPOSE, NOTIFY LAB     WITHIN 5 DAYS.                LOWEST DETECTABLE LIMITS     FOR URINE DRUG SCREEN     Drug Class       Cutoff (ng/mL)     Amphetamine      1000     Barbiturate      200     Benzodiazepine   191     Tricyclics       660     Opiates          300     Cocaine          300     THC              50  URINE MICROSCOPIC-ADD ON     Status: Abnormal   Collection Time    06/18/14  6:19 AM      Result Value Ref Range   Squamous Epithelial / LPF RARE  RARE   WBC, UA 3-6  <3 WBC/hpf   RBC / HPF 0-2  <3 RBC/hpf   Bacteria, UA MANY (*) RARE   Casts HYALINE CASTS (*) NEGATIVE   Urine-Other MUCOUS  PRESENT     Labs are reviewed and are pertinent for benzo's.  Current Facility-Administered Medications  Medication Dose Route Frequency Provider Last Rate Last Dose  . 0.9 %  sodium chloride infusion   Intravenous Continuous Alyssa Griffins, MD 75 mL/hr at 06/18/14 0618    . albuterol (PROVENTIL) (2.5 MG/3ML) 0.083% nebulizer solution 2.5 mg  2.5 mg Nebulization Q4H PRN Costin Karlyne Greenspan, MD      . antiseptic oral rinse (BIOTENE) solution 15 mL  15 mL Mouth Rinse q12n4p Costin Karlyne Greenspan, MD      . chlorhexidine (PERIDEX) 0.12 % solution 15 mL  15 mL Mouth Rinse BID Alyssa Griffins, MD   15 mL at 06/18/14 6004  . enoxaparin (LOVENOX) injection 70 mg  70 mg Subcutaneous Q24H Alyssa Griffins, MD   70 mg at 06/17/14 2211  . mometasone-formoterol (DULERA) 100-5 MCG/ACT inhaler 2 puff  2 puff Inhalation BID Alyssa Griffins, MD   2 puff at 06/18/14 0933  . tiotropium (SPIRIVA) inhalation capsule 18 mcg  18 mcg Inhalation Daily Alyssa Griffins, MD   18 mcg at 06/18/14 0932    Psychiatric Specialty Exam: Physical Exam  ROS  Blood pressure 121/79, pulse 60, temperature 99.7 F (37.6 C), temperature source Oral, resp. rate 16, height _0  (1.702 m), weight 145.5 kg (320 lb 12.3 oz), SpO2 96.00%.Body mass index is 50.23 kg/(m^2).  General Appearance: Guarded  Eye Contact::  Fair  Speech:  Clear and Coherent and Slow  Volume:  Decreased  Mood:  Anxious, Depressed, Hopeless and Worthless  Affect:  Congruent and Depressed  Thought Process:  Coherent and Goal Directed  Orientation:  Full (Time, Place, and Person)  Thought Content:  WDL  Suicidal Thoughts:  Yes.  with intent/plan  Homicidal Thoughts:  No  Memory:  Immediate;   Fair Recent;   Fair  Judgement:  Impaired  Insight:  Lacking  Psychomotor Activity:  Psychomotor Retardation and Restlessness  Concentration:  Fair  Recall:  Good  Fund of Knowledge:Good  Language: Good  Akathisia:  NA  Handed:  Right  AIMS (if indicated):      Assets:  Communication Skills Desire for Improvement Housing Leisure Time Resilience Social Support Talents/Skills Transportation  Sleep:      Musculoskeletal: Strength & Muscle Tone: within normal limits Gait & Station: normal Patient leans: N/A  Treatment Plan Summary: Daily contact with patient to assess and evaluate symptoms and progress in treatment Medication management  Roisin Mones,JANARDHAHA R. 06/18/2014 9:50 AM

## 2014-06-18 NOTE — Progress Notes (Signed)
Notified respiratory therapy that patient needs cpap at night and is requesting a breathing treatment.

## 2014-06-18 NOTE — Progress Notes (Addendum)
Full report given to Western & Southern FinancialCynthia RN 5E. VSS on transfer. Belongings left in equipment storage room as per unit secretary direction.

## 2014-06-18 NOTE — Progress Notes (Signed)
RT setup Auto CPAP 5-20 CMH20 with 2 LPM O2 via FFM.  Pt stated that she would self administer when ready for sleep.  RT to monitor and assess as needed.

## 2014-06-18 NOTE — Progress Notes (Signed)
Utilization Review Completed.Makale Pindell T7/07/2014  

## 2014-06-18 NOTE — Progress Notes (Signed)
Patient ID: Alyssa [name], female   DOB: 1976/07/31, 38 y.o.   MRN: [id number]  [facility] PROGRESS NOTE  Alyssa [name] WUJ:[id number] DOB: 1976/07/31 DOA: 06/17/2014 PCP: PROVIDER NOT IN SYSTEM  Brief narrative: [age] y.o. female with anxiety, bipolar disorder, HLD, obesity, tobacco abuse, HTN, [facility] multiple prescription medication in an suicide intent.  Principal Problem:   Drug overdose - more alert this AM, vitals stable - continue to monitor closely and transfer to telemetry  - advance diet as pt able to tolerate  Active Problems:   TOBACCO ABUSE - will discuss cessation once pt more medically stable and able to participate in coversation    HYPERTENSION - place on Hydralazine scheduled and as needed   Suicidal Ideations  - still suicidal - sitter at bedside - psych consult requested    Acute blood loss anemia - likely dilutional - no signs of active bleeding - CBC in AM  Consultants:  Psych   Procedures/Studies:  None  Antibiotics:  None  Code Status: Full Family Communication: Pt at bedside Disposition Plan: Home when medically stable  HPI/Subjective: No events overnight.   Objective: Filed Vitals:   06/18/14 0810 06/18/14 0934 06/18/14 1201 06/18/14 1228  BP:    170/91  Pulse:    68  Temp: 99.7 F (37.6 C)  98.7 F (37.1 C) 98.1 F (36.7 C)  TempSrc: Oral  Oral Oral  Resp:    20  Height:      Weight:      SpO2:  96%  96%    Intake/Output Summary (Last 24 hours) at 06/18/14 1237 Last data filed at 06/18/14 0600  Gross per 24 hour  Intake  987.5 ml  Output    750 ml  Net  237.5 ml    Exam:   General:  Pt is more alert, follows commands appropriately, not in acute distress  Cardiovascular: Regular rate and rhythm, S1/S2, no murmurs, no rubs, no gallops  Respiratory: Clear to auscultation bilaterally, no wheezing, no crackles, no rhonchi  Abdomen: Soft, non tender, non distended, bowel sounds  present, no guarding  Extremities: No edema, pulses DP and PT palpable bilaterally  Data Reviewed: Basic Metabolic Panel:  Recent Labs Lab 06/17/14 1341 06/18/14 0345  NA 140 141  K 3.7 4.1  CL 101 104  CO2 27 28  GLUCOSE 100* 98  BUN 5* 7  CREATININE 0.71 0.84  CALCIUM 9.1 8.4  MG 2.0  --    Liver Function Tests:  Recent Labs Lab 06/17/14 1341 06/18/14 0345  AST 15 13  ALT 14 12  ALKPHOS 84 78  BILITOT 0.3 0.4  PROT 6.5 6.2  ALBUMIN 3.4* 3.0*   No results found for this basename: LIPASE, AMYLASE,  in the last 168 hours No results found for this basename: AMMONIA,  in the last 168 hours CBC:  Recent Labs Lab 06/17/14 1341 06/18/14 0345  WBC 8.9 8.7  HGB 11.4* 10.7*  HCT 37.4 36.2  MCV 78.7 80.3  PLT 270 238    Recent Results (from the past 240 hour(s))  MRSA PCR SCREENING     Status: None   Collection Time    06/17/14  4:31 PM      Result Value Ref Range Status   MRSA by PCR NEGATIVE  NEGATIVE Final   Comment:            The GeneXpert MRSA Assay (FDA     approved for NASAL  specimens     only), is one component of a     comprehensive MRSA colonization     surveillance program. It is not     intended to diagnose MRSA     infection nor to guide or     monitor treatment for     MRSA infections.     Scheduled Meds: . antiseptic oral rinse  15 mL Mouth Rinse q12n4p  . chlorhexidine  15 mL Mouth Rinse BID  . enoxaparin (LOVENOX) injection  70 mg Subcutaneous Q24H  . mometasone-formoterol  2 puff Inhalation BID  . tiotropium  18 mcg Inhalation Daily   Continuous Infusions: . sodium chloride 75 mL/hr at 06/18/14 [id number]     Debbora PrestoMAGICK-Breiana Stratmann, MD  [facility] Pager [phone]  If 7PM-7AM, please contact night-coverage [email or web address] Password [email or web address] 06/18/2014, 12:37 PM   LOS: 1 day

## 2014-06-18 NOTE — Progress Notes (Signed)
Repaged Dr. Izola PriceMyers

## 2014-06-18 NOTE — Progress Notes (Signed)
Received from ICU via wheelchair, suicide precautions initiated, sitter present.  Patient request to be left alone and now sleeping in bed.  2L oxygen via nasal canula in place, foley catheter draining clear yellow urine.

## 2014-06-18 NOTE — Progress Notes (Signed)
Clinical Social Work  CSW received referral in order to complete psychosocial assessment. CSW staffed case with psych MD who reports CPS report should be made to DSS due to safety of children. CSW went to room to meet with patient. Patient drowsy and disengaged. CSW admitted to wake patient and to complete assessment but patient is not cooperating at this time. CSW will follow up at later time to complete assessment.  NeogaHolly Latriece Anstine, KentuckyLCSW [phone]

## 2014-06-19 ENCOUNTER — Observation Stay (HOSPITAL_COMMUNITY): Payer: Medicaid Other

## 2014-06-19 DIAGNOSIS — Z8249 Family history of ischemic heart disease and other diseases of the circulatory system: Secondary | ICD-10-CM | POA: Diagnosis not present

## 2014-06-19 DIAGNOSIS — G4730 Sleep apnea, unspecified: Secondary | ICD-10-CM | POA: Diagnosis present

## 2014-06-19 DIAGNOSIS — T438X2A Poisoning by other psychotropic drugs, intentional self-harm, initial encounter: Secondary | ICD-10-CM | POA: Diagnosis present

## 2014-06-19 DIAGNOSIS — R4182 Altered mental status, unspecified: Secondary | ICD-10-CM | POA: Diagnosis present

## 2014-06-19 DIAGNOSIS — T424X4A Poisoning by benzodiazepines, undetermined, initial encounter: Secondary | ICD-10-CM | POA: Diagnosis present

## 2014-06-19 DIAGNOSIS — D62 Acute posthemorrhagic anemia: Secondary | ICD-10-CM | POA: Diagnosis present

## 2014-06-19 DIAGNOSIS — Z5989 Other problems related to housing and economic circumstances: Secondary | ICD-10-CM | POA: Diagnosis not present

## 2014-06-19 DIAGNOSIS — D72829 Elevated white blood cell count, unspecified: Secondary | ICD-10-CM | POA: Diagnosis present

## 2014-06-19 DIAGNOSIS — F17200 Nicotine dependence, unspecified, uncomplicated: Secondary | ICD-10-CM | POA: Diagnosis present

## 2014-06-19 DIAGNOSIS — M545 Low back pain, unspecified: Secondary | ICD-10-CM | POA: Diagnosis present

## 2014-06-19 DIAGNOSIS — Z6841 Body Mass Index (BMI) 40.0 and over, adult: Secondary | ICD-10-CM | POA: Diagnosis not present

## 2014-06-19 DIAGNOSIS — T43501A Poisoning by unspecified antipsychotics and neuroleptics, accidental (unintentional), initial encounter: Secondary | ICD-10-CM | POA: Diagnosis present

## 2014-06-19 DIAGNOSIS — T398X2A Poisoning by other nonopioid analgesics and antipyretics, not elsewhere classified, intentional self-harm, initial encounter: Secondary | ICD-10-CM | POA: Diagnosis present

## 2014-06-19 DIAGNOSIS — I10 Essential (primary) hypertension: Secondary | ICD-10-CM | POA: Diagnosis present

## 2014-06-19 DIAGNOSIS — R059 Cough, unspecified: Secondary | ICD-10-CM | POA: Diagnosis present

## 2014-06-19 DIAGNOSIS — Z598 Other problems related to housing and economic circumstances: Secondary | ICD-10-CM | POA: Diagnosis not present

## 2014-06-19 DIAGNOSIS — E785 Hyperlipidemia, unspecified: Secondary | ICD-10-CM | POA: Diagnosis present

## 2014-06-19 DIAGNOSIS — E669 Obesity, unspecified: Secondary | ICD-10-CM | POA: Diagnosis present

## 2014-06-19 DIAGNOSIS — M810 Age-related osteoporosis without current pathological fracture: Secondary | ICD-10-CM | POA: Diagnosis present

## 2014-06-19 DIAGNOSIS — T40601A Poisoning by unspecified narcotics, accidental (unintentional), initial encounter: Secondary | ICD-10-CM | POA: Diagnosis present

## 2014-06-19 DIAGNOSIS — T394X2A Poisoning by antirheumatics, not elsewhere classified, intentional self-harm, initial encounter: Secondary | ICD-10-CM | POA: Diagnosis present

## 2014-06-19 DIAGNOSIS — J45909 Unspecified asthma, uncomplicated: Secondary | ICD-10-CM | POA: Diagnosis present

## 2014-06-19 DIAGNOSIS — Z5987 Material hardship: Secondary | ICD-10-CM | POA: Diagnosis not present

## 2014-06-19 DIAGNOSIS — F411 Generalized anxiety disorder: Secondary | ICD-10-CM | POA: Diagnosis present

## 2014-06-19 DIAGNOSIS — R05 Cough: Secondary | ICD-10-CM | POA: Diagnosis present

## 2014-06-19 DIAGNOSIS — R509 Fever, unspecified: Secondary | ICD-10-CM | POA: Diagnosis present

## 2014-06-19 DIAGNOSIS — G8929 Other chronic pain: Secondary | ICD-10-CM | POA: Diagnosis present

## 2014-06-19 DIAGNOSIS — T43294A Poisoning by other antidepressants, undetermined, initial encounter: Secondary | ICD-10-CM | POA: Diagnosis present

## 2014-06-19 DIAGNOSIS — F3130 Bipolar disorder, current episode depressed, mild or moderate severity, unspecified: Secondary | ICD-10-CM | POA: Diagnosis present

## 2014-06-19 DIAGNOSIS — T43502A Poisoning by unspecified antipsychotics and neuroleptics, intentional self-harm, initial encounter: Secondary | ICD-10-CM | POA: Diagnosis present

## 2014-06-19 DIAGNOSIS — IMO0001 Reserved for inherently not codable concepts without codable children: Secondary | ICD-10-CM | POA: Diagnosis present

## 2014-06-19 LAB — CBC
HCT: 36.9 % (ref 36.0–46.0)
Hemoglobin: 11.1 g/dL — ABNORMAL LOW (ref 12.0–15.0)
MCH: 24 pg — ABNORMAL LOW (ref 26.0–34.0)
MCHC: 30.1 g/dL (ref 30.0–36.0)
MCV: 79.9 fL (ref 78.0–100.0)
PLATELETS: 288 10*3/uL (ref 150–400)
RBC: 4.62 MIL/uL (ref 3.87–5.11)
RDW: 16.6 % — ABNORMAL HIGH (ref 11.5–15.5)
WBC: 10.8 10*3/uL — ABNORMAL HIGH (ref 4.0–10.5)

## 2014-06-19 LAB — BASIC METABOLIC PANEL
ANION GAP: 12 (ref 5–15)
BUN: 6 mg/dL (ref 6–23)
CO2: 28 mEq/L (ref 19–32)
Calcium: 8.8 mg/dL (ref 8.4–10.5)
Chloride: 98 mEq/L (ref 96–112)
Creatinine, Ser: 0.75 mg/dL (ref 0.50–1.10)
GFR calc Af Amer: >90 mL/min (ref >90)
Glucose, Bld: 97 mg/dL (ref 70–99)
Potassium: 3.7 mEq/L (ref 3.7–5.3)
SODIUM: 138 meq/L (ref 137–147)

## 2014-06-19 LAB — STREP PNEUMONIAE URINARY ANTIGEN: Strep Pneumo Urinary Antigen: NEGATIVE

## 2014-06-19 MED ORDER — DEXTROSE 5 % IV SOLN
500.0000 mg | INTRAVENOUS | Status: DC
  Administered 2014-06-19: 500 mg via INTRAVENOUS
  Filled 2014-06-19 (×2): qty 500

## 2014-06-19 MED ORDER — ALBUTEROL SULFATE (2.5 MG/3ML) 0.083% IN NEBU
2.5000 mg | INHALATION_SOLUTION | RESPIRATORY_TRACT | Status: DC | PRN
  Administered 2014-06-19: 2.5 mg via RESPIRATORY_TRACT
  Filled 2014-06-19: qty 3

## 2014-06-19 MED ORDER — CLONAZEPAM 0.5 MG PO TABS
0.5000 mg | ORAL_TABLET | Freq: Two times a day (BID) | ORAL | Status: DC | PRN
  Administered 2014-06-19 – 2014-06-20 (×3): 0.5 mg via ORAL
  Filled 2014-06-19 (×3): qty 1

## 2014-06-19 NOTE — Consult Note (Signed)
[facility] Face-to-Face Psychiatry Consult   Reason for Consult:  Overdose with Suicidal Intent Referring Physician:  Caren Griffins, MD   Alyssa [name] is an 38 y.o. female. Total Time spent with patient: 45 minutes  Assessment: AXIS I:  Bipolar, Depressed AXIS II:  Deferred AXIS III:   Past Medical History  Diagnosis Date  . Asthma   . Anxiety   . Fibromyalgia   . Bipolar disorder   . HTN (hypertension)     ACEI cough   . HLD (hyperlipidemia)   . Osteoporosis   . Low back pain     2 ruptured discs in lower back   . Active smoker   . LVH (left ventricular hypertrophy)     echo (7/11) showed EF 70%, mild LVH, no sig valvular disease, moderate pericardial effussion. echo (9/11) showed no pericardial effusion, RF 65%, mild MR, normal RV size and systolic function.    AXIS IV:  economic problems, occupational problems, other psychosocial or environmental problems, problems related to social environment and problems with primary support group AXIS V:  41-50 serious symptoms  Plan: Case discussed with Sindy Messing, LCSW Okay to discontinue sitter as she is contracting for safety Hold medication for now and may start when medically cleared Recommend psychiatric Inpatient admission when medically cleared. Supportive therapy provided about ongoing stressors. Appreciate psychiatric consultation and follow up as clinically required Please contact [phone] if needs further assistance  Subjective:   Alyssa [name] is a 38 y.o. female patient admitted with suicidal overdose.  HPI: Patient is seen, chart reviewed and case discussed with Sindy Messing, LCSW. Patient is poorly cooperative. Will ask case management to contact DSS due to suicidal attempt while minors at home. Psychiatric consultation called in for bipolar depression and status post suicidal attempt with multiple psychotropic medication with intention to end her life but she is minimizes her intent at this time, patient has  multiple psychosocial stresses. She has been thinking about suicide about a month prior to the incident and her Friend removed some of the medication from her hand. She blames her friend and sister who contacted emergency medical services and has no regrets about the attempt. 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 She has been suffering with depression, irritability, disturbed sleep and appetite. She has difficult time to manage her two younger children at home (78 and 76 years old). Patient stated that she does not want to be bothered and wants to be alone. She has taken [facility] and denied having psychiatric services, her medications are seroquel, effexor, neurontin and clonazepam. She has multiple previous psych admission and last one at [facility] is 2012 for similar clinical presentation. She has oxygen supply by [employer] and states she uses CPAP at home during night only. UDS is positive for benzo's.  Interval history: Patient was moved from ICU and has been calm and cooperative. Patient has been compliant with treatment and staff. She has a visitor who she said is a friend. She was visited by CPS during this morning. She has endorses the episode as suicide attempt because of multiple psychosocial stresses. She has contracted for safety and willing to sign in voluntarily for crisis stabilization, safety monitoring and medication management.   Medical history:  Alyssa [name] is a 38 y.o. female has a past medical history significant for Anxiety, bipolar disorder, HLD, obesity, tobacco abuse, HTN, presents to  [facility] with CC of ingesting multiple prescription medication with suicide intent. Patient does not wish to answer my questions stating "I already told everyone else its in my chart what I took". Per chart review she took an unknown amount of Percocet (apparently had ~10 left), clonazepam, Effexor and Seroquel. She took  these medications about 4 hours prior to my evaluation. Patient wakes up some to my questions and denies chest pain, breathing difficulties, has no abdominal pain, nausea or vomiting.   Review of Systems: unable to obtain full ROS due to lethargy   HPI Elements:   Location:  depression. Quality:  poor. Severity:  acut4. Timing:  unknown.  Past Psychiatric History: Past Medical History  Diagnosis Date  . Asthma   . Anxiety   . Fibromyalgia   . Bipolar disorder   . HTN (hypertension)     ACEI cough   . HLD (hyperlipidemia)   . Osteoporosis   . Low back pain     2 ruptured discs in lower back   . Active smoker   . LVH (left ventricular hypertrophy)     echo (7/11) showed EF 70%, mild LVH, no sig valvular disease, moderate pericardial effussion. echo (9/11) showed no pericardial effusion, RF 65%, mild MR, normal RV size and systolic function.     reports that she has been smoking.  She has never used smokeless tobacco. She reports that she does not drink alcohol or use illicit drugs. Family History  Problem Relation Age of Onset  . Coronary artery disease      family hx     Living Arrangements: Children   Abuse/Neglect [facility]) Physical Abuse: Denies Verbal Abuse: Denies Sexual Abuse: Denies Allergies:  No Known Allergies  ACT Assessment Complete:  NO Objective: Blood pressure 159/89, pulse 83, temperature 97.7 F (36.5 C), temperature source Oral, resp. rate 18, height 5' 7"  (1.702 m), weight 145.5 kg (320 lb 12.3 oz), last menstrual period 06/12/2014, SpO2 100.00%.Body mass index is 50.23 kg/(m^2). Results for orders placed during the hospital encounter of 06/17/14 (from the past 72 hour(s))  CBC     Status: Abnormal   Collection Time    06/17/14  1:41 PM      Result Value Ref Range   WBC 8.9  4.0 - 10.5 K/uL   RBC 4.75  3.87 - 5.11 MIL/uL   Hemoglobin 11.4 (*) 12.0 - 15.0 g/dL   HCT 37.4  36.0 - 46.0 %   MCV 78.7  78.0 - 100.0 fL   MCH 24.0 (*) 26.0 - 34.0 pg    MCHC 30.5  30.0 - 36.0 g/dL   RDW 16.3 (*) 11.5 - 15.5 %   Platelets 270  150 - 400 K/uL  COMPREHENSIVE METABOLIC PANEL     Status: Abnormal   Collection Time    06/17/14  1:41 PM      Result Value Ref Range   Sodium 140  137 - 147 mEq/L   Potassium 3.7  3.7 - 5.3 mEq/L   Chloride 101  96 - 112 mEq/L   CO2 27  19 - 32 mEq/L   Glucose, Bld 100 (*) 70 - 99 mg/dL   BUN 5 (*) 6 - 23 mg/dL   Creatinine, Ser 0.71  0.50 - 1.10 mg/dL   Calcium 9.1  8.4 - 10.5 mg/dL   Total Protein 6.5  6.0 - 8.3 g/dL   Albumin 3.4 (*) 3.5 - 5.2 g/dL   AST 15  0 - 37 U/L  ALT 14  0 - 35 U/L   Alkaline Phosphatase 84  39 - 117 U/L   Total Bilirubin 0.3  0.3 - 1.2 mg/dL   GFR calc non Af Amer >90  >90 mL/min   GFR calc Af Amer >90  >90 mL/min   Comment: (NOTE)     The eGFR has been calculated using the CKD EPI equation.     This calculation has not been validated in all clinical situations.     eGFR's persistently <90 mL/min signify possible Chronic Kidney     Disease.   Anion gap 12  5 - 15  ETHANOL     Status: None   Collection Time    06/17/14  1:41 PM      Result Value Ref Range   Alcohol, Ethyl (B) <11  0 - 11 mg/dL   Comment:            LOWEST DETECTABLE LIMIT FOR     SERUM ALCOHOL IS 11 mg/dL     FOR MEDICAL PURPOSES ONLY  ACETAMINOPHEN LEVEL     Status: None   Collection Time    06/17/14  1:41 PM      Result Value Ref Range   Acetaminophen (Tylenol), Serum <15.0  10 - 30 ug/mL   Comment:            THERAPEUTIC CONCENTRATIONS VARY     SIGNIFICANTLY. A RANGE OF 10-30     ug/mL MAY BE AN EFFECTIVE     CONCENTRATION FOR MANY PATIENTS.     HOWEVER, SOME ARE BEST TREATED     AT CONCENTRATIONS OUTSIDE THIS     RANGE.     ACETAMINOPHEN CONCENTRATIONS     >150 ug/mL AT 4 HOURS AFTER     INGESTION AND >50 ug/mL AT 12     HOURS AFTER INGESTION ARE     OFTEN ASSOCIATED WITH TOXIC     REACTIONS.  SALICYLATE LEVEL     Status: Abnormal   Collection Time    06/17/14  1:41 PM      Result  Value Ref Range   Salicylate Lvl <3.2 (*) 2.8 - 20.0 mg/dL  MAGNESIUM     Status: None   Collection Time    06/17/14  1:41 PM      Result Value Ref Range   Magnesium 2.0  1.5 - 2.5 mg/dL  MRSA PCR SCREENING     Status: None   Collection Time    06/17/14  4:31 PM      Result Value Ref Range   MRSA by PCR NEGATIVE  NEGATIVE   Comment:            The GeneXpert MRSA Assay (FDA     approved for NASAL specimens     only), is one component of a     comprehensive MRSA colonization     surveillance program. It is not     intended to diagnose MRSA     infection nor to guide or     monitor treatment for     MRSA infections.  ACETAMINOPHEN LEVEL     Status: None   Collection Time    06/17/14  9:32 PM      Result Value Ref Range   Acetaminophen (Tylenol), Serum <15.0  10 - 30 ug/mL   Comment:            THERAPEUTIC CONCENTRATIONS VARY     SIGNIFICANTLY. A RANGE OF 10-30  ug/mL MAY BE AN EFFECTIVE     CONCENTRATION FOR MANY PATIENTS.     HOWEVER, SOME ARE BEST TREATED     AT CONCENTRATIONS OUTSIDE THIS     RANGE.     ACETAMINOPHEN CONCENTRATIONS     >150 ug/mL AT 4 HOURS AFTER     INGESTION AND >50 ug/mL AT 12     HOURS AFTER INGESTION ARE     OFTEN ASSOCIATED WITH TOXIC     REACTIONS.  COMPREHENSIVE METABOLIC PANEL     Status: Abnormal   Collection Time    06/18/14  3:45 AM      Result Value Ref Range   Sodium 141  137 - 147 mEq/L   Potassium 4.1  3.7 - 5.3 mEq/L   Chloride 104  96 - 112 mEq/L   CO2 28  19 - 32 mEq/L   Glucose, Bld 98  70 - 99 mg/dL   BUN 7  6 - 23 mg/dL   Creatinine, Ser 0.84  0.50 - 1.10 mg/dL   Calcium 8.4  8.4 - 10.5 mg/dL   Total Protein 6.2  6.0 - 8.3 g/dL   Albumin 3.0 (*) 3.5 - 5.2 g/dL   AST 13  0 - 37 U/L   ALT 12  0 - 35 U/L   Alkaline Phosphatase 78  39 - 117 U/L   Total Bilirubin 0.4  0.3 - 1.2 mg/dL   GFR calc non Af Amer 87 (*) >90 mL/min   GFR calc Af Amer >90  >90 mL/min   Comment: (NOTE)     The eGFR has been calculated using the  CKD EPI equation.     This calculation has not been validated in all clinical situations.     eGFR's persistently <90 mL/min signify possible Chronic Kidney     Disease.   Anion gap 9  5 - 15  CBC     Status: Abnormal   Collection Time    06/18/14  3:45 AM      Result Value Ref Range   WBC 8.7  4.0 - 10.5 K/uL   RBC 4.51  3.87 - 5.11 MIL/uL   Hemoglobin 10.7 (*) 12.0 - 15.0 g/dL   HCT 36.2  36.0 - 46.0 %   MCV 80.3  78.0 - 100.0 fL   MCH 23.7 (*) 26.0 - 34.0 pg   MCHC 29.6 (*) 30.0 - 36.0 g/dL   RDW 16.7 (*) 11.5 - 15.5 %   Platelets 238  150 - 400 K/uL  PROTIME-INR     Status: None   Collection Time    06/18/14  3:45 AM      Result Value Ref Range   Prothrombin Time 13.3  11.6 - 15.2 seconds   INR 1.01  0.00 - 1.49  URINALYSIS, ROUTINE W REFLEX MICROSCOPIC     Status: Abnormal   Collection Time    06/18/14  6:19 AM      Result Value Ref Range   Color, Urine AMBER (*) YELLOW   Comment: BIOCHEMICALS MAY BE AFFECTED BY COLOR   APPearance CLOUDY (*) CLEAR   Specific Gravity, Urine 1.026  1.005 - 1.030   pH 5.5  5.0 - 8.0   Glucose, UA NEGATIVE  NEGATIVE mg/dL   Hgb urine dipstick NEGATIVE  NEGATIVE   Bilirubin Urine NEGATIVE  NEGATIVE   Ketones, ur NEGATIVE  NEGATIVE mg/dL   Protein, ur NEGATIVE  NEGATIVE mg/dL   Urobilinogen, UA 0.2  0.0 - 1.0 mg/dL  Nitrite POSITIVE (*) NEGATIVE   Leukocytes, UA NEGATIVE  NEGATIVE  URINE RAPID DRUG SCREEN (HOSP PERFORMED)     Status: Abnormal   Collection Time    06/18/14  6:19 AM      Result Value Ref Range   Opiates NONE DETECTED  NONE DETECTED   Cocaine NONE DETECTED  NONE DETECTED   Benzodiazepines POSITIVE (*) NONE DETECTED   Amphetamines NONE DETECTED  NONE DETECTED   Tetrahydrocannabinol NONE DETECTED  NONE DETECTED   Barbiturates NONE DETECTED  NONE DETECTED   Comment:            DRUG SCREEN FOR MEDICAL PURPOSES     ONLY.  IF CONFIRMATION IS NEEDED     FOR ANY PURPOSE, NOTIFY LAB     WITHIN 5 DAYS.                 LOWEST DETECTABLE LIMITS     FOR URINE DRUG SCREEN     Drug Class       Cutoff (ng/mL)     Amphetamine      1000     Barbiturate      200     Benzodiazepine   683     Tricyclics       419     Opiates          300     Cocaine          300     THC              50  URINE MICROSCOPIC-ADD ON     Status: Abnormal   Collection Time    06/18/14  6:19 AM      Result Value Ref Range   Squamous Epithelial / LPF RARE  RARE   WBC, UA 3-6  <3 WBC/hpf   RBC / HPF 0-2  <3 RBC/hpf   Bacteria, UA MANY (*) RARE   Casts HYALINE CASTS (*) NEGATIVE   Urine-Other MUCOUS PRESENT    TROPONIN I     Status: None   Collection Time    06/18/14  8:05 PM      Result Value Ref Range   Troponin I <0.30  <0.30 ng/mL   Comment:            Due to the release kinetics of cTnI,     a negative result within the first hours     of the onset of symptoms does not rule out     myocardial infarction with certainty.     If myocardial infarction is still suspected,     repeat the test at appropriate intervals.  CBC     Status: Abnormal   Collection Time    06/19/14 12:10 AM      Result Value Ref Range   WBC 10.8 (*) 4.0 - 10.5 K/uL   RBC 4.62  3.87 - 5.11 MIL/uL   Hemoglobin 11.1 (*) 12.0 - 15.0 g/dL   HCT 36.9  36.0 - 46.0 %   MCV 79.9  78.0 - 100.0 fL   MCH 24.0 (*) 26.0 - 34.0 pg   MCHC 30.1  30.0 - 36.0 g/dL   RDW 16.6 (*) 11.5 - 15.5 %   Platelets 288  150 - 400 K/uL  BASIC METABOLIC PANEL     Status: None   Collection Time    06/19/14 12:10 AM      Result Value Ref Range   Sodium 138  137 - 147  mEq/L   Potassium 3.7  3.7 - 5.3 mEq/L   Chloride 98  96 - 112 mEq/L   CO2 28  19 - 32 mEq/L   Glucose, Bld 97  70 - 99 mg/dL   BUN 6  6 - 23 mg/dL   Creatinine, Ser 0.75  0.50 - 1.10 mg/dL   Calcium 8.8  8.4 - 10.5 mg/dL   GFR calc non Af Amer >90  >90 mL/min   GFR calc Af Amer >90  >90 mL/min   Comment: (NOTE)     The eGFR has been calculated using the CKD EPI equation.     This calculation has not been  validated in all clinical situations.     eGFR's persistently <90 mL/min signify possible Chronic Kidney     Disease.   Anion gap 12  5 - 15   Labs are reviewed and are pertinent for benzo's.  Current Facility-Administered Medications  Medication Dose Route Frequency Provider Last Rate Last Dose  . albuterol (PROVENTIL) (2.5 MG/3ML) 0.083% nebulizer solution 2.5 mg  2.5 mg Nebulization Q2H PRN Theodis Blaze, MD      . antiseptic oral rinse (BIOTENE) solution 15 mL  15 mL Mouth Rinse q12n4p Caren Griffins, MD   15 mL at 06/19/14 1208  . azithromycin (ZITHROMAX) 500 mg in dextrose 5 % 250 mL IVPB  500 mg Intravenous Q24H Theodis Blaze, MD      . chlorhexidine (PERIDEX) 0.12 % solution 15 mL  15 mL Mouth Rinse BID Caren Griffins, MD   15 mL at 06/19/14 0811  . clonazePAM (KLONOPIN) tablet 0.5 mg  0.5 mg Oral BID PRN Theodis Blaze, MD   0.5 mg at 06/19/14 1408  . enoxaparin (LOVENOX) injection 70 mg  70 mg Subcutaneous Q24H Caren Griffins, MD   70 mg at 06/18/14 2157  . hydrALAZINE (APRESOLINE) injection 5 mg  5 mg Intravenous Q6H PRN Theodis Blaze, MD      . hydrALAZINE (APRESOLINE) tablet 10 mg  10 mg Oral 3 times per day Theodis Blaze, MD   10 mg at 06/19/14 0608  . lidocaine (LIDODERM) 5 % 1 patch  1 patch Transdermal Q24H Theodis Blaze, MD   1 patch at 06/18/14 2157  . mometasone-formoterol (DULERA) 100-5 MCG/ACT inhaler 2 puff  2 puff Inhalation BID Caren Griffins, MD   2 puff at 06/19/14 0858  . nicotine (NICODERM CQ - dosed in mg/24 hours) patch 21 mg  21 mg Transdermal Daily Theodis Blaze, MD   21 mg at 06/19/14 1005  . tiotropium (SPIRIVA) inhalation capsule 18 mcg  18 mcg Inhalation Daily Caren Griffins, MD   18 mcg at 06/19/14 0858  . traMADol (ULTRAM) tablet 50 mg  50 mg Oral Q6H PRN Theodis Blaze, MD   50 mg at 06/19/14 1408    Psychiatric Specialty Exam: Physical Exam  ROS  Blood pressure 159/89, pulse 83, temperature 97.7 F (36.5 C), temperature source Oral, resp.  rate 18, height 5' 7"  (1.702 m), weight 145.5 kg (320 lb 12.3 oz), last menstrual period 06/12/2014, SpO2 100.00%.Body mass index is 50.23 kg/(m^2).  General Appearance: Guarded  Eye Contact::  Fair  Speech:  Clear and Coherent and Slow  Volume:  Decreased  Mood:  Anxious, Depressed, Hopeless and Worthless  Affect:  Congruent and Depressed  Thought Process:  Coherent and Goal Directed  Orientation:  Full (Time, Place, and Person)  Thought Content:  WDL  Suicidal Thoughts:  Yes.  with intent/plan  Homicidal Thoughts:  No  Memory:  Immediate;   Fair Recent;   Fair  Judgement:  Impaired  Insight:  Lacking  Psychomotor Activity:  Psychomotor Retardation and Restlessness  Concentration:  Fair  Recall:  Good  Fund of Knowledge:Good  Language: Good  Akathisia:  NA  Handed:  Right  AIMS (if indicated):     Assets:  Communication Skills Desire for Improvement Housing Leisure Time Resilience Social Support Talents/Skills Transportation  Sleep:      Musculoskeletal: Strength & Muscle Tone: within normal limits Gait & Station: normal Patient leans: N/A  Treatment Plan Summary: Daily contact with patient to assess and evaluate symptoms and progress in treatment Medication management  Neylan Koroma,JANARDHAHA R. 06/19/2014 2:53 PM

## 2014-06-19 NOTE — Progress Notes (Signed)
Clinical Social Work Department CLINICAL SOCIAL WORK PSYCHIATRY SERVICE LINE ASSESSMENT 06/19/2014  Patient:  Alyssa [name]  Account:  [id number]  [place] Date:  06/17/2014  Clinical Social Worker:  Sindy Messing, LCSW  Date/Time:  06/19/2014 08:45 AM Referred by:  Physician  Date referred:  06/19/2014 Reason for Referral  Psychosocial assessment   Presenting Symptoms/Problems (In the person's/family's own words):   Psych consulted due to overdose.   Abuse/Neglect/Trauma History (check all that apply)  Denies history   Abuse/Neglect/Trauma Comments:   Psychiatric History (check all that apply)  Outpatient treatment  Inpatient/hospitilization   Psychiatric medications:  Klonopin 0.5 mg  Seroquel 300 mg  Effexor 37.5 mg   Current Mental Health Hospitalizations/Previous Mental Health History:   Patient reports she was diagnosed with bipolar disorder several years ago. Patient reports she was also diagnosed with a personality disorder but is unable to provide any further information. Patient does not have any current outpatient follow up and PCP prescribes medications.   Current provider:   [place] and Date:   [place], [place]   Current Medications:   Scheduled Meds:      . antiseptic oral rinse  15 mL Mouth Rinse q12n4p  . chlorhexidine  15 mL Mouth Rinse BID  . enoxaparin (LOVENOX) injection  70 mg Subcutaneous Q24H  . hydrALAZINE  10 mg Oral 3 times per day  . lidocaine  1 patch Transdermal Q24H  . mometasone-formoterol  2 puff Inhalation BID  . nicotine  21 mg Transdermal Daily  . tiotropium  18 mcg Inhalation Daily        Continuous Infusions:      PRN Meds:.albuterol, hydrALAZINE, traMADol       Previous Impatient Admission/Date/Reason:   Patient reports several hospitalizations. Per chart review, patient had [facility] admissions in 2012, 2010, 2008, and 2006.   Emotional Health / Current Symptoms    Suicide/Self Harm  Suicide attempt in past  (date/description)   Suicide attempt in the past:   Patient admitted due to overdose attempt. Patient reports that she has had at least 5 attempts in the past. Patient reports she is unsure if she continues to have SI. Patient denies any HI.   Other harmful behavior:   None reported   Psychotic/Dissociative Symptoms  None reported   Other Psychotic/Dissociative Symptoms:    Attention/Behavioral Symptoms  Within Normal Limits   Other Attention / Behavioral Symptoms:   Patient engaged during assessment.    Cognitive Impairment  Within Normal Limits   Other Cognitive Impairment:   Patient alert and oriented.    Mood and Adjustment  Flat    Stress, Anxiety, Trauma, Any Recent Loss/Stressor  Relationship   Anxiety (frequency):   N/A   Phobia (specify):   N/A   Compulsive behavior (specify):   N/A   Obsessive behavior (specify):   N/A   Other:   Patient reports strained relationship with ex-boyfriend and that it is difficult to raise her children.   Substance Abuse/Use  Current substance use   SBIRT completed (please refer for detailed history):  Y  Self-reported substance use:   Patient reports she drinks 2-3 beers a night because it helps her sleep. Patient reports she has been drinking alcohol for several years. Patient states that she tries to avoid cocaine because it has bad affects on her but is unable to state the last night she used any cocaine. Patient reports no addiction to substances and does not want treatment. CSW encouraged  patient to talk with MD about other sleeping options rather than relying on alcohol.   Urinary Drug Screen Completed:  Y Alcohol level:   <11    Environmental/Housing/Living Arrangement  Stable housing   Who is in the home:   100 yo and 2 yo sons   Emergency contact:  Sandra-mom   Financial  Medicaid   Patient's Strengths and Goals (patient's own words):   Patient reports she has supportive best friend and  sisters.   Clinical Social Worker's Interpretive Summary:   CSW received referral in order to complete psychosocial assessment. CSW reviewed chart and met with patient at bedside. CSW introduced myself and explained role.    Patient sitting up in bed and eating breakfast when CSW arrived. Patient reports she is feeling better and hopeful to DC soon. Patient reports she had to come to the hospital after overdosing on medication. Patient's current boyfriend (Mali) was at the house and called patient's sister for additional help. Patient state she had been experiencing suicidal thoughts for about 2 weeks prior to attempting to overdose. Patient reports she has attempted suicide at least 5 times in the past and is unsure if she is still having SI.    Patient states that she was with a man Bobby Rumpf) for 14 years off and on. Patient reports this was not a great relationship and they both had affairs during relationship. Ex-boyfriend left patient a few months ago and recently married a 58 year old woman. Ex-boyfriend had agreed to pay child support of $500 a week but patient reports he has not followed through with his promises. Patient reports that it has become difficult to manage family and feels overwhelmed. Patient has 63 yo son (Detwan), 21 yo son Elita Quick), and 59 yo son Darrick Meigs). Patient also has a 41 yo grandson and another grandchild on the way. Patient reports that her sons are disrespectful and "spoiled brats". Patient tells CSW a story of having to call the police on Monday because children were getting aggressive and she was worried about her safety. Patient has worked with Intensive In-home services in the past but reports it has not been helpful.    Patient admits to diagnosis of bipolar and personality disorder. Patient reports that she receives medication management through [facility] but no current therapy or psychiatrist. Patient reports several hospitalizations in the past and  current substance use.    Patient engaged during assessment and open to discussing family dynamics. Patient is concerned about DC plans and reports she does need additional support at DC.    Psych MD requested CPS report due to minors at home when overdose occurred. CSW made report on 06/19/14 at 10:35 am to Michail Jewels at [facility]. CSW will continue to follow and will assist with any recommendations provided by psych MD   Disposition:  Recommend Psych CSW continuing to support while in hospital   [place], [place] [phone]

## 2014-06-19 NOTE — Progress Notes (Signed)
Patient ID: Alyssa [name], female   DOB: Aug 13, 1976, 38 y.o.   MRN: [id number]  [facility] PROGRESS NOTE  CHARMIAN FORBIS WUJ:[id number] DOB: 13-Feb-1976 DOA: 06/17/2014 PCP: PROVIDER NOT IN SYSTEM  Brief narrative:  38 y.o. female with anxiety, bipolar disorder, HLD, obesity, tobacco abuse, HTN, presented to [facility] with CC of ingesting multiple prescription medication in an suicide intent.   Principal Problem:  Drug overdose  - more alert this AM, vitals stable  - reports she feels better this AM  - possible d/c home if psych agrees - d/c sitter, pt denies being suicidal  Active Problems:  TOBACCO ABUSE  - discussed cessation and pt verbalized understanding  HYPERTENSION  - place on Hydralazine scheduled and as needed  - BP remains stable  Suicidal Ideations  - denies SI this AM, d/c sitter  - psych consult appreciated  Acute blood loss anemia  - likely dilutional  - no signs of active bleeding  - CBC in AM  Cough - with mild leukocytosis and low grade fever 99.7 F - CXR requested  - place on empiric Zithromax and obtain sputum culture   Consultants:  Psych  Procedures/Studies:  None Antibiotics:  None  Code Status: Full  Family Communication: Pt at bedside  Disposition Plan: Home likely in AM  HPI/Subjective: No events overnight.   Objective: Filed Vitals:   06/18/14 1942 06/18/14 2212 06/19/14 0517 06/19/14 0858  BP:  140/82 136/90   Pulse:  83 77   Temp:  97.5 F (36.4 C) 97.6 F (36.4 C)   TempSrc:  Oral Oral   Resp:  18 18   Height:      Weight:      SpO2: 95% 95% 95% 98%    Intake/Output Summary (Last 24 hours) at 06/19/14 1228 Last data filed at 06/19/14 1041  Gross per 24 hour  Intake   3075 ml  Output   4252 ml  Net  -1177 ml    Exam:   General:  Pt is alert, follows commands appropriately, not in acute distress  Cardiovascular: Regular rate and rhythm, S1/S2, no murmurs, no rubs, no gallops  Respiratory: Clear to auscultation  bilaterally, no wheezing, no crackles, no rhonchi  Abdomen: Soft, non tender, non distended, bowel sounds present, no guarding  Extremities: No edema, pulses DP and PT palpable bilaterally  Neuro: Grossly nonfocal  Data Reviewed: Basic Metabolic Panel:  Recent Labs Lab 06/17/14 1341 06/18/14 0345 06/19/14 0010  NA 140 141 138  K 3.7 4.1 3.7  CL 101 104 98  CO2 27 28 28   GLUCOSE 100* 98 97  BUN 5* 7 6  CREATININE 0.71 0.84 0.75  CALCIUM 9.1 8.4 8.8  MG 2.0  --   --    Liver Function Tests:  Recent Labs Lab 06/17/14 1341 06/18/14 0345  AST 15 13  ALT 14 12  ALKPHOS 84 78  BILITOT 0.3 0.4  PROT 6.5 6.2  ALBUMIN 3.4* 3.0*   CBC:  Recent Labs Lab 06/17/14 1341 06/18/14 0345 06/19/14 0010  WBC 8.9 8.7 10.8*  HGB 11.4* 10.7* 11.1*  HCT 37.4 36.2 36.9  MCV 78.7 80.3 79.9  PLT 270 238 288   Cardiac Enzymes:  Recent Labs Lab 06/18/14 2005  TROPONINI <0.30    Recent Results (from the past 240 hour(s))  MRSA PCR SCREENING     Status: None   Collection Time    06/17/14  4:31 PM      Result Value Ref Range  Status   MRSA by PCR NEGATIVE  NEGATIVE Final   Comment:            The GeneXpert MRSA Assay (FDA     approved for NASAL specimens     only), is one component of a     comprehensive MRSA colonization     surveillance program. It is not     intended to diagnose MRSA     infection nor to guide or     monitor treatment for     MRSA infections.     Scheduled Meds: . antiseptic oral rinse  15 mL Mouth Rinse q12n4p  . chlorhexidine  15 mL Mouth Rinse BID  . enoxaparin (LOVENOX) injection  70 mg Subcutaneous Q24H  . hydrALAZINE  10 mg Oral 3 times per day  . lidocaine  1 patch Transdermal Q24H  . mometasone-formoterol  2 puff Inhalation BID  . nicotine  21 mg Transdermal Daily  . tiotropium  18 mcg Inhalation Daily   Continuous Infusions:  Debbora PrestoMAGICK-Krayton Wortley, MD  [facility] Pager [phone]  If 7PM-7AM, please contact  night-coverage [email or web address] Password [email or web address] 06/19/2014, 12:28 PM   LOS: 2 days

## 2014-06-19 NOTE — Progress Notes (Signed)
UR Completed Laterra Lubinski Graves-Bigelow, RN,BSN [phone]  

## 2014-06-19 NOTE — Progress Notes (Signed)
Pt states she does not need any assistance with CPAP or mask application. Pt's CPAP is on auto titrate 5-20cmH2O. Sterile water is in the humidifier. Pt was encouraged to contact RT if she needs any assistance throughout the night. RT will continue to monitor as needed.

## 2014-06-20 ENCOUNTER — Inpatient Hospital Stay (HOSPITAL_COMMUNITY)
Admission: AD | Admit: 2014-06-20 | Discharge: 2014-06-30 | DRG: 885 | Disposition: A | Discharge status: Home / Self Care | Payer: Medicaid Other | Source: Intra-hospital | Attending: Psychiatry | Admitting: Psychiatry

## 2014-06-20 ENCOUNTER — Encounter (HOSPITAL_COMMUNITY): Payer: Self-pay

## 2014-06-20 DIAGNOSIS — E785 Hyperlipidemia, unspecified: Secondary | ICD-10-CM | POA: Diagnosis present

## 2014-06-20 DIAGNOSIS — F319 Bipolar disorder, unspecified: Secondary | ICD-10-CM | POA: Diagnosis present

## 2014-06-20 DIAGNOSIS — IMO0001 Reserved for inherently not codable concepts without codable children: Secondary | ICD-10-CM | POA: Diagnosis present

## 2014-06-20 DIAGNOSIS — Z8249 Family history of ischemic heart disease and other diseases of the circulatory system: Secondary | ICD-10-CM

## 2014-06-20 DIAGNOSIS — I10 Essential (primary) hypertension: Secondary | ICD-10-CM | POA: Diagnosis present

## 2014-06-20 DIAGNOSIS — F411 Generalized anxiety disorder: Secondary | ICD-10-CM | POA: Diagnosis present

## 2014-06-20 DIAGNOSIS — Z5989 Other problems related to housing and economic circumstances: Secondary | ICD-10-CM | POA: Diagnosis not present

## 2014-06-20 DIAGNOSIS — R45851 Suicidal ideations: Secondary | ICD-10-CM | POA: Diagnosis not present

## 2014-06-20 DIAGNOSIS — F17200 Nicotine dependence, unspecified, uncomplicated: Secondary | ICD-10-CM | POA: Diagnosis present

## 2014-06-20 DIAGNOSIS — G4700 Insomnia, unspecified: Secondary | ICD-10-CM | POA: Diagnosis present

## 2014-06-20 DIAGNOSIS — J45909 Unspecified asthma, uncomplicated: Secondary | ICD-10-CM | POA: Diagnosis present

## 2014-06-20 DIAGNOSIS — F3130 Bipolar disorder, current episode depressed, mild or moderate severity, unspecified: Secondary | ICD-10-CM | POA: Diagnosis present

## 2014-06-20 DIAGNOSIS — T50902A Poisoning by unspecified drugs, medicaments and biological substances, intentional self-harm, initial encounter: Secondary | ICD-10-CM

## 2014-06-20 DIAGNOSIS — F314 Bipolar disorder, current episode depressed, severe, without psychotic features: Secondary | ICD-10-CM

## 2014-06-20 DIAGNOSIS — M810 Age-related osteoporosis without current pathological fracture: Secondary | ICD-10-CM | POA: Diagnosis present

## 2014-06-20 DIAGNOSIS — G8929 Other chronic pain: Secondary | ICD-10-CM | POA: Diagnosis present

## 2014-06-20 DIAGNOSIS — F332 Major depressive disorder, recurrent severe without psychotic features: Principal | ICD-10-CM | POA: Diagnosis present

## 2014-06-20 DIAGNOSIS — Z598 Other problems related to housing and economic circumstances: Secondary | ICD-10-CM

## 2014-06-20 DIAGNOSIS — Z5987 Material hardship due to limited financial resources, not elsewhere classified: Secondary | ICD-10-CM

## 2014-06-20 DIAGNOSIS — F315 Bipolar disorder, current episode depressed, severe, with psychotic features: Secondary | ICD-10-CM

## 2014-06-20 DIAGNOSIS — K219 Gastro-esophageal reflux disease without esophagitis: Secondary | ICD-10-CM | POA: Diagnosis present

## 2014-06-20 LAB — CBC
HEMATOCRIT: 38.4 % (ref 36.0–46.0)
HEMOGLOBIN: 11.5 g/dL — AB (ref 12.0–15.0)
MCH: 23.9 pg — AB (ref 26.0–34.0)
MCHC: 29.9 g/dL — AB (ref 30.0–36.0)
MCV: 79.7 fL (ref 78.0–100.0)
Platelets: 285 10*3/uL (ref 150–400)
RBC: 4.82 MIL/uL (ref 3.87–5.11)
RDW: 16.3 % — ABNORMAL HIGH (ref 11.5–15.5)
WBC: 10.4 10*3/uL (ref 4.0–10.5)

## 2014-06-20 LAB — LEGIONELLA ANTIGEN, URINE: Legionella Antigen, Urine: NEGATIVE

## 2014-06-20 LAB — BASIC METABOLIC PANEL
Anion gap: 12 (ref 5–15)
BUN: 3 mg/dL — AB (ref 6–23)
CALCIUM: 9.1 mg/dL (ref 8.4–10.5)
CO2: 28 mEq/L (ref 19–32)
Chloride: 100 mEq/L (ref 96–112)
Creatinine, Ser: 0.77 mg/dL (ref 0.50–1.10)
GFR calc Af Amer: >90 mL/min (ref >90)
GFR calc non Af Amer: >90 mL/min (ref >90)
GLUCOSE: 116 mg/dL — AB (ref 70–99)
POTASSIUM: 3.6 meq/L — AB (ref 3.7–5.3)
Sodium: 140 mEq/L (ref 137–147)

## 2014-06-20 MED ORDER — MOMETASONE FURO-FORMOTEROL FUM 100-5 MCG/ACT IN AERO
2.0000 | INHALATION_SPRAY | Freq: Two times a day (BID) | RESPIRATORY_TRACT | Status: DC
  Administered 2014-06-20 – 2014-06-30 (×19): 2 via RESPIRATORY_TRACT
  Filled 2014-06-20 (×2): qty 8.8

## 2014-06-20 MED ORDER — HYDRALAZINE HCL 10 MG PO TABS
10.0000 mg | ORAL_TABLET | Freq: Three times a day (TID) | ORAL | Status: DC
  Administered 2014-06-20 – 2014-06-30 (×30): 10 mg via ORAL
  Filled 2014-06-20 (×36): qty 1

## 2014-06-20 MED ORDER — POTASSIUM CHLORIDE CRYS ER 10 MEQ PO TBCR
10.0000 meq | EXTENDED_RELEASE_TABLET | Freq: Every day | ORAL | Status: DC
  Administered 2014-06-21 – 2014-06-30 (×10): 10 meq via ORAL
  Filled 2014-06-20 (×12): qty 1

## 2014-06-20 MED ORDER — TRAMADOL HCL 50 MG PO TABS: 50.0000 mg | ORAL_TABLET | Freq: Four times a day (QID) | ORAL | Status: DC | PRN

## 2014-06-20 MED ORDER — AZITHROMYCIN 250 MG PO TABS: 250.0000 mg | ORAL_TABLET | Freq: Every day | ORAL | Status: DC

## 2014-06-20 MED ORDER — GABAPENTIN 600 MG PO TABS
600.0000 mg | ORAL_TABLET | Freq: Every day | ORAL | Status: DC
  Administered 2014-06-20 – 2014-06-22 (×3): 600 mg via ORAL
  Filled 2014-06-20 (×6): qty 1

## 2014-06-20 MED ORDER — HYDRALAZINE HCL 10 MG PO TABS: 10.0000 mg | ORAL_TABLET | Freq: Three times a day (TID) | ORAL | Status: DC

## 2014-06-20 MED ORDER — ACETAMINOPHEN 325 MG PO TABS
650.0000 mg | ORAL_TABLET | Freq: Four times a day (QID) | ORAL | Status: DC | PRN
  Administered 2014-06-27: 650 mg via ORAL
  Filled 2014-06-20: qty 2

## 2014-06-20 MED ORDER — NICOTINE 21 MG/24HR TD PT24
21.0000 mg | MEDICATED_PATCH | Freq: Every day | TRANSDERMAL | Status: DC
  Administered 2014-06-21 – 2014-06-30 (×10): 21 mg via TRANSDERMAL
  Filled 2014-06-20 (×13): qty 1

## 2014-06-20 MED ORDER — VITAMIN D3 25 MCG (1000 UNIT) PO TABS
5000.0000 [IU] | ORAL_TABLET | Freq: Every day | ORAL | Status: DC
  Administered 2014-06-21 – 2014-06-30 (×10): 5000 [IU] via ORAL
  Filled 2014-06-20 (×12): qty 5

## 2014-06-20 MED ORDER — ALUM & MAG HYDROXIDE-SIMETH 200-200-20 MG/5ML PO SUSP: 30.0000 mL | ORAL | Status: DC | PRN

## 2014-06-20 MED ORDER — PANTOPRAZOLE SODIUM 40 MG PO TBEC
80.0000 mg | DELAYED_RELEASE_TABLET | Freq: Every day | ORAL | Status: DC
  Administered 2014-06-21 – 2014-06-30 (×10): 80 mg via ORAL
  Filled 2014-06-20 (×11): qty 2

## 2014-06-20 MED ORDER — AZITHROMYCIN 250 MG PO TABS
250.0000 mg | ORAL_TABLET | Freq: Every day | ORAL | Status: AC
  Administered 2014-06-21 – 2014-06-25 (×5): 250 mg via ORAL
  Filled 2014-06-20 (×5): qty 1

## 2014-06-20 MED ORDER — MAGNESIUM HYDROXIDE 400 MG/5ML PO SUSP: 30.0000 mL | Freq: Every day | ORAL | Status: DC | PRN

## 2014-06-20 MED ORDER — TRAMADOL HCL 50 MG PO TABS
50.0000 mg | ORAL_TABLET | Freq: Four times a day (QID) | ORAL | Status: DC | PRN
  Administered 2014-06-21 – 2014-06-22 (×3): 50 mg via ORAL
  Filled 2014-06-20 (×3): qty 1

## 2014-06-20 MED ORDER — CLONAZEPAM 0.5 MG PO TABS
0.5000 mg | ORAL_TABLET | Freq: Two times a day (BID) | ORAL | Status: DC
  Administered 2014-06-20 – 2014-06-22 (×4): 0.5 mg via ORAL
  Filled 2014-06-20 (×3): qty 1

## 2014-06-20 MED ORDER — NICOTINE 21 MG/24HR TD PT24: 21.0000 mg | MEDICATED_PATCH | Freq: Every day | TRANSDERMAL | Status: DC

## 2014-06-20 MED ORDER — CLONAZEPAM 0.5 MG PO TABS: 0.5000 mg | ORAL_TABLET | Freq: Two times a day (BID) | ORAL | Status: DC

## 2014-06-20 MED ORDER — QUETIAPINE FUMARATE ER 300 MG PO TB24
300.0000 mg | ORAL_TABLET | Freq: Every day | ORAL | Status: DC
  Administered 2014-06-20 – 2014-06-22 (×3): 300 mg via ORAL
  Filled 2014-06-20 (×6): qty 1

## 2014-06-20 NOTE — Progress Notes (Signed)
Clinical Social Work  CSW spoke with MD who reports patient can DC to inpatient psych hospital today. [facility] and CPAP at night. AC anticipates bed available today and will call CSW back with determination. CSW will continue to follow.  Alyssa [name], [place] [phone]

## 2014-06-20 NOTE — Progress Notes (Signed)
Nursing Discharge Summary  Patient ID: Hollice EspyMitsy M Brooks MRN: [id number] DOB/AGE: 1976/06/28 38 y.o.  Admit date: 06/17/2014 Discharge date: 06/20/2014  Discharged Condition: stable  [facility]position[facility] - Report called to Isaac LaudBrooks Weaver RN  Follow-up Information   Schedule an appointment as soon as possible for a visit with Debbora PrestoMAGICK-MYERS, ISKRA, MD. (As needed)    Specialty:  Internal Medicine   Contact information:   [address] [place] [address] [phone]       Prescriptions Given: NA  Means of Discharge: [place] [employer]  Signed: Starr Sinclairracy, Jamie Elizabeth 06/20/2014, 1:08 PM

## 2014-06-20 NOTE — Discharge Summary (Signed)
Physician Discharge Summary  Alyssa [name]:[id number] DOB: Nov 05, 1976 DOA: 06/17/2014  PCP: PROVIDER NOT IN SYSTEM  Admit date: 06/17/2014 Discharge date: 06/20/2014  Recommendations for Outpatient Follow-up:  1. Pt will need to follow up with PCP in 2-3 weeks post discharge 2. Please obtain BMP to evaluate electrolytes and kidney function 3. Please also check CBC to evaluate Hg and Hct levels 4. Continue Zithromax for 5 more days post discharge 5. CPAP at night   Discharge Diagnoses: Drug overdose on Percocet  Principal Problem:   Drug overdose Active Problems:   HYPERLIPIDEMIA   TOBACCO ABUSE   HYPERTENSION   SLEEP APNEA   Overdose   Discharge Condition: Stable  Diet recommendation: Heart healthy diet discussed in details   Brief narrative:  38 y.o. female with anxiety, bipolar disorder, HLD, obesity, tobacco abuse, HTN, presented to [facility] with CC of ingesting multiple prescription medication in an suicide intent.   Principal Problem:  Drug overdose  - more alert this AM, vitals stable  - reports she feels better this AM  - possible d/c to [facility] once bed available, psych meds to be resumed by psychiatrist  Active Problems:  TOBACCO ABUSE  - discussed cessation and pt verbalized understanding  HYPERTENSION  - place on Hydralazine scheduled  - BP remains stable  Suicidal Ideations  - denies SI this AM, d/c sitter  - psych consult appreciated  Acute blood loss anemia  - likely dilutional  - no signs of active bleeding  Cough  - with mild leukocytosis and low grade fever 99.7 F July 9th, 2015 - CXR unremarkable  - continue Zithromax for 5 more days post discharge   Consultants:  Psych  Procedures/Studies:  None Antibiotics:  Zithromax   Code Status: Full  Family Communication: Pt at bedside    Discharge Exam: Filed Vitals:   06/20/14 0632  BP: 148/95  Pulse: 75  Temp: 97.9 F (36.6 C)  Resp: 18   Filed Vitals:   06/19/14 2130 06/19/14 2215  06/19/14 2359 06/20/14 0632  BP: 176/110 172/105 161/94 148/95  Pulse: 63   75  Temp: 98.5 F (36.9 C)   97.9 F (36.6 C)  TempSrc: Oral   Oral  Resp: 20   18  Height:      Weight:      SpO2: 97%   96%    General: Pt is alert, follows commands appropriately, not in acute distress Cardiovascular: Regular rate and rhythm, S1/S2 +, no murmurs, no rubs, no gallops Respiratory: Clear to auscultation bilaterally, no wheezing, no crackles, no rhonchi Abdominal: Soft, non tender, non distended, bowel sounds +, no guarding Extremities: pulses palpable bilaterally DP and PT Neuro: Grossly nonfocal  Discharge Instructions  Discharge Instructions   Diet - low sodium heart healthy    Complete by:  As directed      Increase activity slowly    Complete by:  As directed             Medication List    STOP taking these medications       oxyCODONE-acetaminophen 10-325 MG per tablet  Commonly known as:  PERCOCET     QUEtiapine 300 MG 24 hr tablet  Commonly known as:  SEROQUEL XR     venlafaxine 37.5 MG tablet  Commonly known as:  EFFEXOR      TAKE these medications       ADVAIR DISKUS 250-50 MCG/DOSE Aepb  Generic drug:  Fluticasone-Salmeterol  Inhale 2 puffs into the lungs  2 (two) times daily as needed (wheezing, SOB).     albuterol (2.5 MG/3ML) 0.083% nebulizer solution  Commonly known as:  PROVENTIL  Take 2.5 mg by nebulization every 4 (four) hours as needed for wheezing or shortness of breath.     azithromycin 250 MG tablet  Commonly known as:  ZITHROMAX  Take 1 tablet (250 mg total) by mouth daily.     clonazePAM 0.5 MG tablet  Commonly known as:  KLONOPIN  Take 1 tablet (0.5 mg total) by mouth 2 (two) times daily.     esomeprazole 40 MG capsule  Commonly known as:  NEXIUM  Take 40 mg by mouth every morning.     gabapentin 600 MG tablet  Commonly known as:  NEURONTIN  Take 600 mg by mouth at bedtime.     hydrALAZINE 10 MG tablet  Commonly known as:   APRESOLINE  Take 1 tablet (10 mg total) by mouth every 8 (eight) hours.     nicotine 21 mg/24hr patch  Commonly known as:  NICODERM CQ - dosed in mg/24 hours  Place 1 patch (21 mg total) onto the skin daily.     potassium chloride 10 MEQ tablet  Commonly known as:  K-DUR  Take 10 mEq by mouth every morning.     tiotropium 18 MCG inhalation capsule  Commonly known as:  SPIRIVA  Place 18 mcg into inhaler and inhale daily as needed (wheezing, SOB).     traMADol 50 MG tablet  Commonly known as:  ULTRAM  Take 1 tablet (50 mg total) by mouth every 6 (six) hours as needed for moderate pain.     Vitamin D-3 5000 UNITS Tabs  Take 1 tablet by mouth every morning.            Follow-up Information   Schedule an appointment as soon as possible for a visit with Debbora PrestoMAGICK-Shanee Batch, MD. (As needed)    Specialty:  Internal Medicine   Contact information:   [address] [place] [address] [phone]        The results of significant diagnostics from this hospitalization (including imaging, microbiology, ancillary and laboratory) are listed below for reference.     Microbiology: Recent Results (from the past 240 hour(s))  MRSA PCR SCREENING     Status: None   Collection Time    06/17/14  4:31 PM      Result Value Ref Range Status   MRSA by PCR NEGATIVE  NEGATIVE Final   Comment:            The GeneXpert MRSA Assay (FDA     approved for NASAL specimens     only), is one component of a     comprehensive MRSA colonization     surveillance program. It is not     intended to diagnose MRSA     infection nor to guide or     monitor treatment for     MRSA infections.     Labs: Basic Metabolic Panel:  Recent Labs Lab 06/17/14 1341 06/18/14 0345 06/19/14 0010 06/20/14 0025  NA 140 141 138 140  K 3.7 4.1 3.7 3.6*  CL 101 104 98 100  CO2 27 28 28 28   GLUCOSE 100* 98 97 116*  BUN 5* 7 6 3*  CREATININE 0.71 0.84 0.75 0.77  CALCIUM 9.1 8.4 8.8 9.1  MG 2.0  --    --   --    Liver Function Tests:  Recent Labs Lab 06/17/14  1341 06/18/14 0345  AST 15 13  ALT 14 12  ALKPHOS 84 78  BILITOT 0.3 0.4  PROT 6.5 6.2  ALBUMIN 3.4* 3.0*  CBC:  Recent Labs Lab 06/17/14 1341 06/18/14 0345 06/19/14 0010 06/20/14 0025  WBC 8.9 8.7 10.8* 10.4  HGB 11.4* 10.7* 11.1* 11.5*  HCT 37.4 36.2 36.9 38.4  MCV 78.7 80.3 79.9 79.7  PLT 270 238 288 285   Cardiac Enzymes:  Recent Labs Lab 06/18/14 2005  TROPONINI <0.30     SIGNED: Time coordinating discharge: Over 30 minutes  Debbora Presto, MD  [facility] 06/20/2014, 11:15 AM Pager [phone]  If 7PM-7AM, please contact night-coverage [email or web address] Password [email or web address]

## 2014-06-20 NOTE — [facility] Group Notes (Signed)
[facility] LCSW Group Therapy  06/20/2014 1:15 PM   Type of Therapy:  Group Therapy  Participation Level:  Did Not Attend - pt is new and was being admitted/oriented to the unit  Reyes Ivanhelsea Horton, LCSW 06/20/2014 2:48 PM

## 2014-06-20 NOTE — Progress Notes (Signed)
D) 38 year old voluntarily admitted to the service of Dr Jama Flavorsobos. 14 year relationship has been rocky and they both decided they were going to stay together. They broke up again and just recently he married a 39 year old and Pt feels abandoned and alone. Two weeks after this happened she lost her job of 3 years. Also had moved into a rental house which Pt can't afford. X boyfriend is helping her financially at this time. Pt. Took an overdose of Seroquel and Effexor and some pain pills. Pt then started "tearing and busting up her house". Pt presents with a sad affect and depressed mood. Tearful. The female who was helping her after she overdosed in presently in jail and one son has charges of trying to break down a door.  A) Given support  along with praise. Encouraged to talk with her family and to speak openly with the doctor. Oriented to the unit and provided with lots of reassurance R) Pt feeling hopeless and states that she is feeling suicidal. Contracts for her safety while on the unit.

## 2014-06-20 NOTE — Progress Notes (Signed)
D:  Pt passive SI-contracts for safety, +ve AH. Pt denies HI/VH. Pt is pleasant and cooperative. Pt appears depressed , pt had no medications or orders at shift change.   A: Pt was offered support and encouragement. Pt was given scheduled medications. Pt was encourage to attend groups. Q 15 minute checks were done for safety.   R:Pt attends groups and interacts well with peers and staff. Pt is taking medication. Pt receptive to treatment and safety maintained on unit.

## 2014-06-20 NOTE — Discharge Instructions (Signed)
Suicidal Feelings, How to Help Yourself  Everyone feels sad or unhappy at times, but depressing thoughts and feelings of hopelessness can lead to thoughts of suicide. It can seem as if life is too tough to handle. If you feel as though you have reached the point where suicide is the only answer, it is time to let someone know immediately.   HOW TO COPE AND PREVENT SUICIDE   Let family, friends, teachers, or counselors know. Get help. Try not to isolate yourself from those who care about you. Even though you may not feel sociable, talk with someone every day. It is best if it is face-to-face. Remember, they will want to help you.   Eat a regularly spaced and well-balanced diet.   Get plenty of rest.   Avoid alcohol and drugs because they will only make you feel worse and may also lower your inhibitions. Remove them from the home. If you are thinking of taking an overdose of your prescribed medicines, give your medicines to someone who can give them to you one day at a time. If you are on antidepressants, let your caregiver know of your feelings so he or she can provide a safer medicine, if that is a concern.   Remove weapons or poisons from your home.   Try to stick to routines. Follow a schedule and remind yourself that you have to keep that schedule every day.   Set some realistic goals and achieve them. Make a list and cross things off as you go. Accomplishments give a sense of worth. Wait until you are feeling better before doing things you find difficult or unpleasant to do.   If you are able, try to start exercising. Even half-hour periods of exercise each day will make you feel better. Getting out in the sun or into nature helps you recover from depression faster. If you have a favorite place to walk, take advantage of that.   Increase safe activities that have always given you pleasure. This may include playing your favorite music, reading a good book, painting a picture, or playing your favorite  instrument. Do whatever takes your mind off your depression.   Keep your living space well-lighted.  GET HELP  Contact a suicide hotline, crisis center, or local suicide prevention center for help right away. Local centers may include a hospital, clinic, community service organization, social service provider, or health department.   Call your local emergency services (911 in the [place]).   Call a suicide hotline:   1-800-273-TALK (1-800-273-8255) in the [place].   1-800-SUICIDE (1-800-784-2433) in the [place].   1-888-628-9454 in the [place] for Spanish-speaking counselors.   1-800-799-4TTY (1-800-799-4889) in the [place] for TTY users.   Visit the following websites for information and help:   National Suicide Prevention Lifeline: www.suicidepreventionlifeline.org   Hopeline: www.hopeline.com   American Foundation for Suicide Prevention: www.afsp.org   For lesbian, gay, bisexual, transgender, or questioning youth, contact The Trevor Project:   1-866-4-U-TREVOR (1-866-488-7386) in the [place].   www.thetrevorproject.org   In [place], treatment resources are listed in each province with listings available under [employer] or similar titles. Another source for Crisis Centres by Province is located at http://www.suicideprevention.ca/in-crisis-now/find-a-crisis-centre-now/crisis-centres  Document Released: 06/04/2003 Document Revised: 02/20/2012 Document Reviewed: 10/23/2007  ExitCare Patient Information 2015 [employer]. This information is not intended to replace advice given to you by your health care provider. Make sure you discuss any questions you have with your health   care provider.

## 2014-06-20 NOTE — Progress Notes (Signed)
Clinical Social Work  Patient accepted to [facility] [id number]. RN to call report to [phone]. CSW met with patient at bedside to explain DC plans. Patient tearful and upset about family situation but aware that inpatient treatment is needed. Patient agreeable to sign voluntary forms and reports she will update family on plans. Patient is hopeful to start medication soon because she feels she "is going out of control" without medications. CSW faxed voluntary form to [facility] and placed original copy on chart. [place] inquired about patient bring CPAP machine but respiratory reports machine is unable to transfer to [facility]. [facility] aware and will make arrangements for CPAP. CSW arranged transportation via [place] is signing off but available if needed.  [place], [place] [phone]

## 2014-06-20 NOTE — Progress Notes (Signed)
[facility] Group Notes:  (Nursing/MHT/Case Management/Adjunct)  Date:  06/20/2014  Time:  9:24 PM  Type of Therapy:  Psychoeducational Skills  Participation Level:  Active  Participation Quality:  Attentive  Affect:  Depressed  Cognitive:  Lacking  Insight:  Lacking  Engagement in Group:  Resistant  Modes of Intervention:  Education  Summary of Progress/Problems: The patient verbalized in group that she had an "overwhelming" day. She did not feel comfortable sharing additional details in group. As a theme for the day, her support system will be comprised of her three sons and her sisters.   Hazle CocaGOODMAN, Can Lucci S 06/20/2014, 9:24 PM

## 2014-06-21 MED ORDER — SERTRALINE HCL 50 MG PO TABS
50.0000 mg | ORAL_TABLET | Freq: Every day | ORAL | Status: DC
  Administered 2014-06-21 – 2014-06-25 (×5): 50 mg via ORAL
  Filled 2014-06-21 (×8): qty 1

## 2014-06-21 NOTE — Progress Notes (Signed)
Pt O2 94 % RA
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
## 2014-06-21 NOTE — [facility] Suicide Risk Assessment (Signed)
   Nursing information obtained from:   pt Demographic factors:   caucasian female Current Mental Status:   depressed, overwhelmed Loss Factors:   relationship is ending, lost her job Historical Factors:   hx of depression and SA Risk Reduction Factors:   children Total Time spent with patient: 20 minutes  CLINICAL FACTORS:   Bipolar Disorder:   Depressive phase Depression:   Anhedonia Hopelessness  Psychiatric Specialty Exam: Physical Exam  ROS  Blood pressure 134/72, pulse 87, temperature 97.9 F (36.6 C), temperature source Oral, resp. rate 18, height 5\' 6"  (1.676 m), weight 144.244 kg (318 lb), last menstrual period 06/12/2014, SpO2 96.00%.Body mass index is 51.35 kg/(m^2).  General Appearance: Casual  Eye Contact::  Good  Speech:  Clear and Coherent  Volume:  Normal  Mood:  Depressed  Affect:  Congruent  Thought Process:  Goal Directed  Orientation:  NA  Thought Content:  Hallucinations: Auditory  Suicidal Thoughts:  Yes.  without intent/plan  Homicidal Thoughts:  No  Memory:  Immediate;   Fair Recent;   Fair Remote;   Fair  Judgement:  Fair  Insight:  Present  Psychomotor Activity:  Normal  Concentration:  Good  Recall:  Good  Fund of Knowledge:Fair  Language: Fair  Akathisia:  No  Handed:  Right  AIMS (if indicated):     Assets:  Communication Skills Desire for Improvement  Sleep:  Number of Hours: 5   Musculoskeletal: Strength & Muscle Tone: within normal limits Gait & Station: normal Patient leans: N/A  COGNITIVE FEATURES THAT CONTRIBUTE TO RISK:  Thought constriction (tunnel vision)    SUICIDE RISK:   Moderate:  Frequent suicidal ideation with limited intensity, and duration, some specificity in terms of plans, no associated intent, good self-control, limited dysphoria/symptomatology, some risk factors present, and identifiable protective factors, including available and accessible social support.  PLAN OF CARE:  I certify that inpatient services  furnished can reasonably be expected to improve the patient's condition.  Oletta DarterGARWAL, Coady Train 06/21/2014, 3:24 PM

## 2014-06-21 NOTE — H&P (Signed)
I agree with the assessment and plan. Will start Zoloft 50mg  po qD for depression.

## 2014-06-21 NOTE — [facility] Group Notes (Signed)
[facility] Group Notes: (Clinical Social Work)   06/21/2014      Type of Therapy:  Group Therapy   Participation Level:  Did Not Attend    Ambrose MantleMareida Grossman-Orr, LCSW 06/21/2014, 4:46 PM

## 2014-06-21 NOTE — Progress Notes (Signed)
Patient came to medication window and requested her meds. She reports her day as being so-so and did not care to elaborate. She reports passive si, denies hi/a/v hallucinations. She reports that she did not sleep with a c-pap last night and AC K. Herbin called and got c-pap snet over. Writer set up her c-pap and oxygen. Patient c/o back pain and was aware of next dose for ultram. She received heat packs to help with her pain until ultram due again. Support and encouragement given, safety maintained on unit with 15 min checks.
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
## 2014-06-21 NOTE — Progress Notes (Signed)
D) Pt has been attending the groups and interacting with her peers. Affect is flat and mood depressed. Pt rates her depression and hopelessness both at an 8 and admits to thoughts of SI on and off. Pt states that she usually is in so much pain that it is difficult for her.  A) Given support, reassurance and praise. Encouragement given and Pt encouraged to begin journaling.  R) Pt contracting for her safety while on the unit. States she will come to staff if she feels that she is going to hurt herself.

## 2014-06-21 NOTE — Progress Notes (Signed)
Psychoeducational Group Note  Date:  06/21/2014 Time: 1015  Group Topic/Focus:  Identifying Needs:   The focus of this group is to help patients identify their personal  Goals and healthy steps to accomplish them. Participation Level:  Minimal  Participation Quality:  Attentive  Affect:  Blunted  Cognitive:  Oriented  Insight:  Engaged  Engagement in Group:  Engaged  Additional Comments:    06/21/2014,10:38 AM Rich Braveuke, Jahyra Sukup Lynn

## 2014-06-22 MED ORDER — TRAMADOL HCL 50 MG PO TABS
100.0000 mg | ORAL_TABLET | Freq: Four times a day (QID) | ORAL | Status: DC | PRN
  Administered 2014-06-22 – 2014-06-30 (×20): 100 mg via ORAL
  Filled 2014-06-22 (×20): qty 2

## 2014-06-22 MED ORDER — CLONAZEPAM 0.5 MG PO TABS
0.5000 mg | ORAL_TABLET | Freq: Three times a day (TID) | ORAL | Status: DC | PRN
  Administered 2014-06-22 – 2014-06-30 (×19): 0.5 mg via ORAL
  Filled 2014-06-22 (×19): qty 1

## 2014-06-22 NOTE — Progress Notes (Addendum)
D Alyssa [name] is seen OOB UAL on the 500 hall today, tolerated fair. She is sad, depressed and flat.  She takes her meds as ordered and she attends her groups, demonstrates minimal engagement into her groups, but states she's having an " awful " day, due to " my nerves".    A She completed her morning am inventory and writes on it she denies SI within the past 24 hrs  And documents her DC plan is " more time for myself".   R Safety is in place and poc moves forward.

## 2014-06-22 NOTE — Progress Notes (Signed)
Psychoeducational Group Note  Date: 06/22/2014  Time: 0930  Group Topic/Focus:  Gratefulness: The focus of this group is to help patients identify what two things they are most grateful for in their lives. What helps ground them and to center them on their work to their recovery.  Participation Level: Active  Participation Quality: Appropriate  Affect: Appropriate  Cognitive: Oriented  Insight: Improving  Engagement in Group: Engaged  Additional Comments:  Alyssa [name]       

## 2014-06-22 NOTE — Progress Notes (Signed)
[facility] Group Notes:  (Nursing/MHT/Case Management/Adjunct)  Date:  06/22/2014  Time:  12:08 AM  Type of Therapy:  Psychoeducational Skills  Participation Level:  Active  Participation Quality:  Attentive  Affect:  Appropriate  Cognitive:  Appropriate  Insight:  Improving  Engagement in Group:  Improving  Modes of Intervention:  Education  Summary of Progress/Problems: The patient verbalized in group that she spent her day sitting amongst her peers for support. The patient also indicated that she had two family visits today. As a theme for the day, she mentioned that her coping skills are as follows: journaling and listening to music.   Alyssa [name] S 06/22/2014, 12:08 AM

## 2014-06-22 NOTE — [facility] Group Notes (Signed)
[facility] Group Notes:  (Clinical Social Work)  06/22/2014   1:15-2:15PM  Summary of Progress/Problems:  The main focus of today's process group was to   identify the patient's current support system and decide on other supports that can be put in place.  The picture on workbook was used to discuss why additional supports are needed.  An emphasis was placed on using counselor, doctor, therapy groups, 12-step groups, and problem-specific support groups to expand supports.   There was also an extensive discussion about what constitutes a healthy support versus an unhealthy support.  The patient expressed full comprehension of the concepts presented.  Current healthy supports include her sisters and children.  She wants to add her father and grandmother back in as supports.  She was gone for much of group but did return toward the end.  Type of Therapy:  Process Group  Participation Level:  Minimal  Participation Quality:  Attentive   Affect:  Blunted  Cognitive:  Appropriate and Oriented  Insight:  Improving  Engagement in Therapy:  Improving  Modes of Intervention:  Education,  Support and Processing  Ambrose MantleMareida Grossman-Orr, LCSW 06/22/2014, 4:00pm

## 2014-06-22 NOTE — Progress Notes (Signed)
[facility] MD Progress Note  06/22/2014 12:49 PM Alyssa [name]  MRN:  [id number] Subjective:  No different today Spoke with pt and reviewed chart. Pt is attending groups but is not sure if it is helping. States pt's complain and it causes her anxiety. She has found some support from a few pts here.   Pt states meds are not doing anything. Anxiety, irritability and depression continue. Pt is having AH of noises and laughing.  Today found out cousin was in a bad MVA and a friend was found dead this morning by her children.  Reports back pain is severe and she is usually treated with Percocet by the pain clinic. Lidocaine patches are not helping.   Denies SE from meds.   Diagnosis:  Bipolar, Depressed, Major Depression, Recurrent severe and DRUGOVER DOSE, SUICIDE ATTEMPT   Total Time spent with patient: 20 minutes    ADL's:  Intact  Sleep: Poor  Appetite:  Good  Suicidal Ideation: having SI that are decreased in intensity. Denies plan and intent. Thinks about her kids to stop thoughts.  Homicidal Ideation: denies AEB (as evidenced by):  Psychiatric Specialty Exam: Physical Exam  Review of Systems  Musculoskeletal: Positive for back pain.  Psychiatric/Behavioral: Positive for depression. The patient is nervous/anxious.     Blood pressure 129/84, pulse 116, temperature 97.9 F (36.6 C), temperature source Oral, resp. rate 18, height 5\' 6"  (1.676 m), weight 144.244 kg (318 lb), last menstrual period 06/12/2014, SpO2 96.00%.Body mass index is 51.35 kg/(m^2).  General Appearance: Neat  Eye Contact::  Good  Speech:  Clear and Coherent  Volume:  Normal  Mood:  Depressed  Affect:  Congruent  Thought Process:  Goal Directed  Orientation:  Full (Time, Place, and Person)  Thought Content:  Hallucinations: Auditory  Suicidal Thoughts:  Yes.  without intent/plan  Homicidal Thoughts:  No  Memory:  Immediate;   Fair Recent;   Fair Remote;   Fair  Judgement:  Intact  Insight:  Present   Psychomotor Activity:  Normal  Concentration:  Fair  Recall:  [employer] of Knowledge:Fair  Language: Fair  Akathisia:  Yes  Handed:  Right  AIMS (if indicated):     Assets:  Communication Skills Desire for Improvement  Sleep:  Number of Hours: 6.5   Musculoskeletal: Strength & Muscle Tone: within normal limits Gait & Station: normal Patient leans: N/A  Current Medications: Current Facility-Administered Medications  Medication Dose Route Frequency Provider Last Rate Last Dose  . acetaminophen (TYLENOL) tablet 650 mg  650 mg Oral Q6H PRN Kristeen Mans, NP      . alum & mag hydroxide-simeth (MAALOX/MYLANTA) 200-200-20 MG/5ML suspension 30 mL  30 mL Oral Q4H PRN Kristeen Mans, NP      . azithromycin (ZITHROMAX) tablet 250 mg  250 mg Oral Daily Kristeen Mans, NP   250 mg at 06/22/14 1035  . cholecalciferol (VITAMIN D) tablet 5,000 Units  5,000 Units Oral Daily Kristeen Mans, NP   5,000 Units at 06/22/14 1036  . clonazePAM (KLONOPIN) tablet 0.5 mg  0.5 mg Oral BID Kristeen Mans, NP   0.5 mg at 06/22/14 1036  . gabapentin (NEURONTIN) tablet 600 mg  600 mg Oral QHS Kristeen Mans, NP   600 mg at 06/21/14 2108  . hydrALAZINE (APRESOLINE) tablet 10 mg  10 mg Oral 3 times per day Kristeen Mans, NP   10 mg at 06/22/14 0640  . magnesium hydroxide (MILK OF MAGNESIA) suspension  30 mL  30 mL Oral Daily PRN Kristeen MansFran E Hobson, NP      . mometasone-formoterol (DULERA) 100-5 MCG/ACT inhaler 2 puff  2 puff Inhalation BID Kristeen MansFran E Hobson, NP   2 puff at 06/22/14 1035  . nicotine (NICODERM CQ - dosed in mg/24 hours) patch 21 mg  21 mg Transdermal Daily Kristeen MansFran E Hobson, NP   21 mg at 06/21/14 0849  . pantoprazole (PROTONIX) EC tablet 80 mg  80 mg Oral Q1200 Kristeen MansFran E Hobson, NP   80 mg at 06/21/14 0853  . potassium chloride (K-DUR,KLOR-CON) CR tablet 10 mEq  10 mEq Oral Daily Kristeen MansFran E Hobson, NP   10 mEq at 06/22/14 1036  . QUEtiapine (SEROQUEL XR) 24 hr tablet 300 mg  300 mg Oral QHS Kristeen MansFran E Hobson, NP   300 mg at  06/21/14 2108  . sertraline (ZOLOFT) tablet 50 mg  50 mg Oral Daily Oletta DarterSalina Kaio Kuhlman, MD   50 mg at 06/21/14 1644  . traMADol (ULTRAM) tablet 50 mg  50 mg Oral Q6H PRN Kristeen MansFran E Hobson, NP   50 mg at 06/22/14 1038    Lab Results: No results found for this or any previous visit (from the past 48 hour(s)).  Physical Findings: AIMS: Facial and Oral Movements Muscles of Facial Expression: None, normal Lips and Perioral Area: None, normal Jaw: None, normal Tongue: None, normal,Extremity Movements Upper (arms, wrists, hands, fingers): None, normal Lower (legs, knees, ankles, toes): None, normal, Trunk Movements Neck, shoulders, hips: None, normal, Overall Severity Severity of abnormal movements (highest score from questions above): None, normal Incapacitation due to abnormal movements: None, normal Patient's awareness of abnormal movements (rate only patient's report): No Awareness, Dental Status Current problems with teeth and/or dentures?: No Does patient usually wear dentures?: No  CIWA:    COWS:     Treatment Plan Summary: Daily contact with patient to assess and evaluate symptoms and progress in treatment Medication management  Plan: Zoloft 50mg  po qD for depression Take Gabapentin 600 mg po QHS for mood/anxiety  Increase Clonazepam .0.5 mg po to TID for anxiety  Seroquel XR 24 HR 300 MG PO QHS for mood   Medical Decision Making Problem Points:  Established problem, stable/improving (1) and Review of psycho-social stressors (1) Data Points:  Review of medication regiment & side effects (2) Review of new medications or change in dosage (2)  I certify that inpatient services furnished can reasonably be expected to improve the patient's condition.   Oletta DarterGARWAL, Makinzi Prieur 06/22/2014, 12:49 PM

## 2014-06-22 NOTE — Progress Notes (Signed)
Psychoeducational Group Note  Date:  06/22/2014 Time:  1015  Group Topic/Focus:  Making Healthy Choices:   The focus of this group is to help patients identify negative/unhealthy choices they were using prior to admission and identify positive/healthier coping strategies to replace them upon discharge.  Participation Level:  Active  Participation Quality:  Appropriate  Affect:  Appropriate  Cognitive:  Oriented  Insight:  Improving  Engagement in Group:  Engaged  Additional Comments:    Draxton Luu A 06/22/2014 

## 2014-06-22 NOTE — Progress Notes (Signed)
Adult Psychoeducational Group Note  Date:  06/22/2014 Time:  10:00 PM  Group Topic/Focus:  Wrap-Up Group:   The focus of this group is to help patients review their daily goal of treatment and discuss progress on daily workbooks.  Participation Level:  Minimal  Participation Quality:  Attentive  Affect:  Appropriate  Cognitive:  Alert and Appropriate  Insight: Limited  Engagement in Group:  Engaged  Modes of Intervention:  Discussion, Education and Support  Additional Comments:  Pt was active during this group and rated her day 8 out of 10. Pt stated her day was "pretty good", but did not have a goal and did not elaborate.  Alyssa [name], Alyssa [name] 06/22/2014, 10:00 PM

## 2014-06-23 DIAGNOSIS — F332 Major depressive disorder, recurrent severe without psychotic features: Principal | ICD-10-CM

## 2014-06-23 MED ORDER — GABAPENTIN 300 MG PO CAPS
600.0000 mg | ORAL_CAPSULE | Freq: Two times a day (BID) | ORAL | Status: DC
  Administered 2014-06-23 – 2014-06-25 (×4): 600 mg via ORAL
  Filled 2014-06-23 (×9): qty 2

## 2014-06-23 MED ORDER — LITHIUM CARBONATE 150 MG PO CAPS
150.0000 mg | ORAL_CAPSULE | Freq: Two times a day (BID) | ORAL | Status: DC
  Administered 2014-06-23 – 2014-06-24 (×2): 150 mg via ORAL
  Filled 2014-06-23 (×8): qty 1

## 2014-06-23 MED ORDER — QUETIAPINE FUMARATE ER 200 MG PO TB24
200.0000 mg | ORAL_TABLET | Freq: Every day | ORAL | Status: DC
  Administered 2014-06-23 – 2014-06-24 (×2): 200 mg via ORAL
  Filled 2014-06-23 (×5): qty 1

## 2014-06-23 NOTE — Progress Notes (Signed)
Writer spoke with patient 1:1 and she reports that her day has been pretty rough. She reports that she received 2 phone calls today each one with bad news. She reported that the weather being cloudy outside didn't help either. Writer encouraged her to try and stay up in the dayroom around other peers. She reports that her life is very complicated right now. Writer encouraged her to focus on herself while here. She was receptive and voiced no other complaints. Safety maintained on unit with 15 min checks.

## 2014-06-23 NOTE — [facility] Group Notes (Signed)
[facility]   06/23/2014 12:46 PM    Participation Quality:  Appropraite  Mood/Affect:  Appropriate  Depression Rating:  8  Anxiety Rating:  8  Thoughts of Suicide:  No  Will you contract for safety?   NA  Current AVH:  No  Plan for Discharge/Comments:  Patient attended discharge planning group and actively participated in group.  She advised of not having an outpatient provider.  [facility] CSW provided all participants with daily workbook.   Transportation Means: Patient has transportation.   Supports:  Patient has a support system.   Mikesha Migliaccio, Joesph JulyQuylle Hairston

## 2014-06-23 NOTE — Progress Notes (Signed)
Patient ID: [name], female   DOB: 1976-06-08, 38 y.o.   MRN: [name] D: Client sitting in room interacting with another client across the hall. Client reports she is here because "I tried to overdose" reports stressors a "breakup with BF of 14 years and teenage boys who won't listen" "I made a bad choice" Client reports BF has gone on to marry a 39 yo girl. "He's 70forty-one years old, that's ashamed" "He had to be seeing her" [facility]. Client goal: "get back on my medicine, find a job" Client reports she lost her job at the [employer], plans to go back to school for business management. A: Writer introduced self to client, provided emotional support, encouraged to follow through with positive goals. Staff will monitor q5315min for safety. R: Pt. Is safe on the unit, attended group.

## 2014-06-23 NOTE — Progress Notes (Signed)
Patient ID: Alyssa [name], female   DOB: 1976/06/03, 38 y.o.   MRN: [id number] [facility]  06/23/2014 4:03 PM Alyssa [name]  MRN:  [id number] Subjective:  Reports ongoing anxiety, but admits to some improvement compared to admission Objective :  Patient has been active in milieu and has been going to groups. She continues to endorse depression and anxiety symptoms. She presents with mild irritability, and feels medications are not working as she was hoping they would. She does state that LiC03 trial in the past was very effective for her and she does not remember having had any side effects from Lithium. She states she has chronic back pain.  Currently she is on Seroquel XR and Zoloft. She is also on Neurontin and was started on Ultram PRNS upon admission for management of pain.  She is denying any current side effects and there have been no symptoms of serotonin syndrome or excessive sedation reported,noted.  We reviewed side effect profile, to include risk of metabolic side effects and weight gain on Seroquel.     Diagnosis:  Bipolar, Depressed, Major Depression, Recurrent severe    Total Time spent with patient: 20 minutes    ADL's:  Intact  Sleep: Improved   Appetite:  Good  Suicidal Ideation:  At this time denies any suicidal ideations  Homicidal Ideation: At this time  denies AEB (as evidenced by):  Psychiatric Specialty Exam: Physical Exam  Review of Systems  Musculoskeletal: Positive for back pain.  Psychiatric/Behavioral: Positive for depression. The patient is nervous/anxious.     Blood pressure 143/77, pulse 90, temperature 97.3 F (36.3 C), temperature source Oral, resp. rate 16, height 5\' 6"  (1.676 m), weight 144.244 kg (318 lb), last menstrual period 06/12/2014, SpO2 96.00%.Body mass index is 51.35 kg/(m^2).  General Appearance: Neat  Eye Contact::  Good  Speech:  Clear and Coherent  Volume:  Normal  Mood:  Depressed but states mood has improved  partially  Affect:  Congruent, somewhat dysphoric and irritable   Thought Process:  Goal Directed  Orientation:  Full (Time, Place, and Person)  Thought Content:  Has experienced some auditory hallucinations, at this time no current halls, does not present internally preoccupied and no delusions are expressed . She is ruminative about recent stressors regarding her BF leaving her for someone else .   Suicidal Thoughts:  Currently denies suicidal plan or intent, contracts for safety on the unit.   Homicidal Thoughts:  No  Memory:  NA   Judgement:  Fair   Insight: Fair   Psychomotor Activity:  Normal  Concentration:  Fair  Recall:  Fair  Fund of Knowledge:Fair  Language: good  Akathisia:  Yes  Handed:  Right  AIMS (if indicated):     Assets:  Communication Skills Desire for Improvement  Sleep:  Number of Hours: 6.25   Musculoskeletal: Strength & Muscle Tone: within normal limits Gait & Station: normal Patient leans: N/A  Current Medications: Current Facility-Administered Medications  Medication Dose Route Frequency Provider Last Rate Last Dose  . acetaminophen (TYLENOL) tablet 650 mg  650 mg Oral Q6H PRN Kristeen MansFran E Hobson, NP      . alum & mag hydroxide-simeth (MAALOX/MYLANTA) 200-200-20 MG/5ML suspension 30 mL  30 mL Oral Q4H PRN Kristeen MansFran E Hobson, NP      . azithromycin (ZITHROMAX) tablet 250 mg  250 mg Oral Daily Kristeen MansFran E Hobson, NP   250 mg at 06/23/14 0839  . cholecalciferol (VITAMIN D) tablet 5,000 Units  5,000 Units Oral Daily Kristeen Mans, NP   5,000 Units at 06/23/14 [phone]  . clonazePAM (KLONOPIN) tablet 0.5 mg  0.5 mg Oral TID PRN Oletta Darter, MD   0.5 mg at 06/23/14 0841  . gabapentin (NEURONTIN) tablet 600 mg  600 mg Oral QHS Kristeen Mans, NP   600 mg at 06/22/14 2259  . hydrALAZINE (APRESOLINE) tablet 10 mg  10 mg Oral 3 times per day Kristeen Mans, NP   10 mg at 06/23/14 1435  . magnesium hydroxide (MILK OF MAGNESIA) suspension 30 mL  30 mL Oral Daily PRN Kristeen Mans, NP       . mometasone-formoterol (DULERA) 100-5 MCG/ACT inhaler 2 puff  2 puff Inhalation BID Kristeen Mans, NP   2 puff at 06/23/14 0837  . nicotine (NICODERM CQ - dosed in mg/24 hours) patch 21 mg  21 mg Transdermal Daily Kristeen Mans, NP   21 mg at 06/23/14 0839  . pantoprazole (PROTONIX) EC tablet 80 mg  80 mg Oral Q1200 Kristeen Mans, NP   80 mg at 06/23/14 1147  . potassium chloride (K-DUR,KLOR-CON) CR tablet 10 mEq  10 mEq Oral Daily Kristeen Mans, NP   10 mEq at 06/23/14 0839  . QUEtiapine (SEROQUEL XR) 24 hr tablet 300 mg  300 mg Oral QHS Kristeen Mans, NP   300 mg at 06/22/14 2259  . sertraline (ZOLOFT) tablet 50 mg  50 mg Oral Daily Oletta Darter, MD   50 mg at 06/23/14 0840  . traMADol (ULTRAM) tablet 100 mg  100 mg Oral Q6H PRN Oletta Darter, MD   100 mg at 06/23/14 1550    Lab Results: No results found for this or any previous visit (from the past 48 hour(s)).  Physical Findings: AIMS: Facial and Oral Movements Muscles of Facial Expression: None, normal Lips and Perioral Area: None, normal Jaw: None, normal Tongue: None, normal,Extremity Movements Upper (arms, wrists, hands, fingers): None, normal Lower (legs, knees, ankles, toes): None, normal, Trunk Movements Neck, shoulders, hips: None, normal, Overall Severity Severity of abnormal movements (highest score from questions above): None, normal Incapacitation due to abnormal movements: None, normal Patient's awareness of abnormal movements (rate only patient's report): No Awareness, Dental Status Current problems with teeth and/or dentures?: No Does patient usually wear dentures?: No  CIWA:    COWS:     Assessment: Patient remains depressed and dysphoric/irritable  but states she is feeling better . She reports a history of good response to LiC03 , without side effects, and there is concern about Seroquel XR metabolic side effect profile.    Treatment Plan Summary: Daily contact with patient to assess and evaluate  symptoms and progress in treatment Medication management  Plan: Zoloft 50mg  po qD for depression Increase Gabapentin  To 600 mg po BID to address chronic pain and ongoing anxiety Clonazepam 0.5 mg po to TID PRN Anxiety Decrease Seroquel XR  To 200  MG PO QHS Start LiC03  at  150 mgrs BID initially- we reviewed side effects. Will also order (routine)  TSH.     Medical Decision Making Problem Points:  Established problem, stable/improving (1) and Review of psycho-social stressors (1) Data Points:  Review of medication regiment & side effects (2) Review of new medications or change in dosage (2)  I certify that inpatient services furnished can reasonably be expected to improve the patient's condition.   Porfiria Heinrich 06/23/2014, 4:03 PM

## 2014-06-23 NOTE — Progress Notes (Signed)
Adult Psychoeducational Group Note  Date:  06/23/2014 Time:  8:30PM Group Topic/Focus:  Wrap-Up Group:   The focus of this group is to help patients review their daily goal of treatment and discuss progress on daily workbooks.  Participation Level:  Active  Participation Quality:  Appropriate and Attentive  Affect:  Appropriate  Cognitive:  Alert and Appropriate  Insight: Appropriate  Engagement in Group:  Engaged  Modes of Intervention:  Discussion  Additional Comments:  Pt. Was attentive and appropriate during tonight's group discussion. Pt stated that today was a good day. Pt stated that she achieved her goal of staying positive.   Bing PlumeScott, Shaquisha Wynn D 06/23/2014, 10:07 PM

## 2014-06-23 NOTE — Progress Notes (Signed)
Patient ID: Alyssa EspyMitsy M Brooks, female   DOB: [birth year], 38 y.o.   MRN: [id number]  D: Pt. Denies HI and A/V Hallucinations. Patient reports passive SI however patient contracts for safety if she feels unsafe. Patient rates her depression 5/10 and her hopelessness 6/10 for the day. Patient reports that she slept well but her energy level is low. Patient reports pain in her lower back and received PRN Tramadol however upon reassessment patient reported no relief. Patient also received PRN Klonopin for anxiety and that was effective.  A: Support and encouragement provided to the patient to speak with writer about any questions or concerns. Scheduled medications administered to patient per physician's orders.  R: Patient is receptive and cooperative but minimal with this Clinical research associatewriter. Patient is seen in the milieu at times and is attending her groups. Patient reports that after discharge she will interact more. Q15 minute checks are maintained for safety.

## 2014-06-23 NOTE — [facility] Group Notes (Signed)
[facility] LCSW Group Therapy          Overcoming Obstacles       1:15 -2:30        06/23/2014       Type of Therapy:  Group Therapy  Participation Level:  Appropriate  Participation Quality:  Appropriate  Affect:  Appropriate, Alert  Cognitive:  Attentive Appropriate  Insight: Developing/Improving Engaged  Engagement in Therapy: Developing/Imprvoing Engaged  Modes of Intervention:  Discussion Exploration  Education Rapport BuildingProblem-Solving Support  Summary of Progress/Problems:  The main focus of today's group was overcoming obstacles.   She shared the obstacle she has to overcome is a 14 year relationship and the loss of her job.  Patient able to identify appropriate coping skills.   Wynn BankerHodnett, Jhamir Pickup Hairston 06/23/2014

## 2014-06-23 NOTE — [facility] Suicide Risk Assessment (Signed)
[facility] INPATIENT:  Family/Significant Other Suicide Prevention Education  Suicide Prevention Education:  Education Completed; Alyssa [name], Sister, [phone]; has been identified by the patient as the family member/significant other with whom the patient will be residing, and identified as the person(s) who will aid the patient in the event of a mental health crisis (suicidal ideations/suicide attempt).  With written consent from the patient, the family member/significant other has been provided the following suicide prevention education, prior to the and/or following the discharge of the patient.  The suicide prevention education provided includes the following:  Suicide risk factors  Suicide prevention and interventions  National Suicide Hotline telephone number  [facility] assessment telephone number  [facility] Emergency Assistance 911  [facility] and/or [facility] Crisis [facility] telephone number  Request made of family/significant other to:  Remove weapons (e.g., guns, rifles, knives), all items previously/currently identified as safety concern.  Sister advised patient does not have access to weapons.    Remove drugs/medications (over-the-counter, prescriptions, illicit drugs), all items previously/currently identified as a safety concern.  The family member/significant other verbalizes understanding of the suicide prevention education information provided.  The family member/significant other agrees to remove the items of safety concern listed above.  Wynn BankerHodnett, Alyssa [name] 06/23/2014, 12:58 PM

## 2014-06-24 LAB — TSH: TSH: 1.16 u[IU]/mL (ref 0.350–4.500)

## 2014-06-24 MED ORDER — LITHIUM CARBONATE 300 MG PO CAPS
300.0000 mg | ORAL_CAPSULE | Freq: Two times a day (BID) | ORAL | Status: DC
  Administered 2014-06-24 – 2014-06-27 (×6): 300 mg via ORAL
  Filled 2014-06-24 (×11): qty 1

## 2014-06-24 NOTE — Progress Notes (Signed)
D: Patient denies SI/HI and A/V hallucinations; patient reports sleep is fair and reports that she did not receive sleep medications; reports appetite is good ; reports energy level is low ; reports ability to pay attention is good; rates depression as 6/10; rates hopelessness 6/10; rates anxiety as 7/10;   A: Monitored q 15 minutes; patient encouraged to attend groups; patient educated about medications; patient given medications per physician orders; patient encouraged to express feelings and/or concerns  R: Patient is walking up and down the hallway with a flat and sad affect; patient will not talk to this writer and just reported having increased anxiety asked for something to help; patient's interaction with staff and peers is minimal; patient was able to set goal to talk with staff 1:1 when having feelings of SI; patient is taking medications as prescribed and tolerating medications; patient is attending some groups but not engaging

## 2014-06-24 NOTE — Progress Notes (Signed)
The focus of this group is to educate the patient on the purpose and policies of crisis stabilization and provide a format to answer questions about their admission.  The group details unit policies and expectations of patients while admitted. Patient did not engage during this group but the patient attended this group.

## 2014-06-24 NOTE — [facility] Group Notes (Signed)
[facility] LCSW Group Therapy Feelings Around Diagnosis 1:15 - 2:30 PM   06/24/2014     Type of Therapy:  Group Therapy  Participation Level:  Did Not Attend   Wynn BankerHodnett, Mylee Falin Hairston 06/24/2014

## 2014-06-24 NOTE — Progress Notes (Signed)
Adult Psychoeducational Group Note  Date:  06/24/2014 Time:  10:38 PM  Group Topic/Focus:  Wrap-Up Group:   The focus of this group is to help patients review their daily goal of treatment and discuss progress on daily workbooks.  Participation Level:  Active  Participation Quality:  Appropriate and Attentive  Affect:  Appropriate  Cognitive:  Alert and Appropriate  Insight: Appropriate and Good  Engagement in Group:  Engaged and Improving  Modes of Intervention:  Activity  Additional Comments:  Pt was in group and was engaged in group   Emad Brechtel R 06/24/2014, 10:38 PM

## 2014-06-24 NOTE — Progress Notes (Signed)
Patient ID: Alyssa [name] [name], female   DOB: [birth year], 38 y.o.   MR[facility] [id number] [facility]  06/24/2014 5:36 PM Alyssa [name] [name]  MRN:  [id number] Subjective:  " I continue to feel bad, sad, irritable"  Objective :  Patient reports ongoing depression and also feeling subjectively irritable and angry. Behavior in good control, she has been somewhat isolative in room, but has gone to  Some groups and is participative. Today, although depressed, constricted, she is presenting with less irritability than yesterday, and she smiles at times appropriately. As noted in previous note, she states  She has had good response to Lithium in the past, and this medication was started yesterday at low initial dose. Denies side effects. Agrees to further titration. TSH within normal limits.     Diagnosis:  Bipolar, Depressed, Major Depression, Recurrent severe    Total Time spent with patient: 20 minutes    ADL's: fair   Sleep: Improved   Appetite:  Good  Suicidal Ideation:  At this time denies any suicidal ideations  Homicidal Ideation: At this time  denies AEB (as evidenced by):  Psychiatric Specialty Exam: Physical Exam  Review of Systems  Musculoskeletal: Positive for back pain.  Psychiatric/Behavioral: Positive for depression. The patient is nervous/anxious.     Blood pressure 121/64, pulse 88, temperature 98.4 F (36.9 C), temperature source Oral, resp. rate 18, height 5\' 6"  (1.676 m), weight 144.244 kg (318 lb), last menstrual period 06/12/2014, SpO2 96.00%.Body mass index is 51.35 kg/(m^2).  General Appearance: Fairly Groomed  Patent attorneyye Contact::  Good  Speech:  Clear and Coherent  Volume:  Normal  Mood:  Depressed, constricted affect   Affect:  Congruent,  Remains sad and vaguely irritable, but affect more reactive today   Thought Process:  Goal Directed  Orientation:  Full (Time, Place, and Person)  Thought Content: Currently denies auditory hallucinations.   Suicidal  Thoughts:  Currently denies suicidal plan or intent, contracts for safety on the unit.   Homicidal Thoughts:  No  Memory:  NA   Judgement:  Fair   Insight: Fair   Psychomotor Activity: decreased   Concentration:  Fair  Recall:  FiservFair  Fund of Knowledge:Fair  Language: good  Akathisia:  Yes  Handed:  Right  AIMS (if indicated):     Assets:  Communication Skills Desire for Improvement  Sleep:  Number of Hours: 6.25   Musculoskeletal: Strength & Muscle Tone: within normal limits Gait & Station: normal Patient leans: N/A  Current Medications: Current Facility-Administered Medications  Medication Dose Route Frequency Provider Last Rate Last Dose  . acetaminophen (TYLENOL) tablet 650 mg  650 mg Oral Q6H PRN Kristeen MansFran E Hobson, NP      . alum & mag hydroxide-simeth (MAALOX/MYLANTA) 200-200-20 MG/5ML suspension 30 mL  30 mL Oral Q4H PRN Kristeen MansFran E Hobson, NP      . azithromycin (ZITHROMAX) tablet 250 mg  250 mg Oral Daily Kristeen MansFran E Hobson, NP   250 mg at 06/24/14 0825  . cholecalciferol (VITAMIN D) tablet 5,000 Units  5,000 Units Oral Daily Kristeen MansFran E Hobson, NP   5,000 Units at 06/24/14 0825  . clonazePAM (KLONOPIN) tablet 0.5 mg  0.5 mg Oral TID PRN Oletta DarterSalina Agarwal, MD   0.5 mg at 06/24/14 1122  . gabapentin (NEURONTIN) capsule 600 mg  600 mg Oral BID Nehemiah MassedFernando Marilyn Nihiser, MD   600 mg at 06/24/14 1633  . hydrALAZINE (APRESOLINE) tablet 10 mg  10 mg Oral 3 times per day Angelina OkFran E  Link Snuffer, NP   10 mg at 06/24/14 1508  . lithium carbonate capsule 300 mg  300 mg Oral BID WC Nehemiah Massed, MD   300 mg at 06/24/14 1632  . magnesium hydroxide (MILK OF MAGNESIA) suspension 30 mL  30 mL Oral Daily PRN Kristeen Mans, NP      . mometasone-formoterol (DULERA) 100-5 MCG/ACT inhaler 2 puff  2 puff Inhalation BID Kristeen Mans, NP   2 puff at 06/24/14 0827  . nicotine (NICODERM CQ - dosed in mg/24 hours) patch 21 mg  21 mg Transdermal Daily Kristeen Mans, NP   21 mg at 06/24/14 0830  . pantoprazole (PROTONIX) EC tablet 80 mg   80 mg Oral Q1200 Kristeen Mans, NP   80 mg at 06/24/14 1124  . potassium chloride (K-DUR,KLOR-CON) CR tablet 10 mEq  10 mEq Oral Daily Kristeen Mans, NP   10 mEq at 06/24/14 0825  . QUEtiapine (SEROQUEL XR) 24 hr tablet 200 mg  200 mg Oral QHS Nehemiah Massed, MD   200 mg at 06/23/14 2112  . sertraline (ZOLOFT) tablet 50 mg  50 mg Oral Daily Oletta Darter, MD   50 mg at 06/24/14 0825  . traMADol (ULTRAM) tablet 100 mg  100 mg Oral Q6H PRN Oletta Darter, MD   100 mg at 06/24/14 1635    Lab Results:  Results for orders placed during the hospital encounter of 06/20/14 (from the past 48 hour(s))  TSH     Status: None   Collection Time    06/24/14  6:20 AM      Result Value Ref Range   TSH 1.160  0.350 - 4.500 uIU/mL   Comment: Performed at [facility]    Physical Findings: AIMS: Facial and Oral Movements Muscles of Facial Expression: None, normal Lips and Perioral Area: None, normal Jaw: None, normal Tongue: None, normal,Extremity Movements Upper (arms, wrists, hands, fingers): None, normal Lower (legs, knees, ankles, toes): None, normal, Trunk Movements Neck, shoulders, hips: None, normal, Overall Severity Severity of abnormal movements (highest score from questions above): None, normal Incapacitation due to abnormal movements: None, normal Patient's awareness of abnormal movements (rate only patient's report): No Awareness, Dental Status Current problems with teeth and/or dentures?: No Does patient usually wear dentures?: No  CIWA:    COWS:     Assessment: Patient remains depressed , constricted and somewhat irritable. Her range of affect is modestly improved. She has tolerated Lithium well thus far and there is a history of good tolerance and response.    Not currently psychotic and no current active suicidal ideations.     Treatment Plan Summary: Daily contact with patient to assess and evaluate symptoms and progress in treatment Medication  management  Plan: Continue inpatient treatment, support, milieu Zoloft 50mg  po qD for depression  Gabapentin  To 600 mg po BID to address chronic pain and ongoing anxiety Clonazepam 0.5 mg po to TID PRN Anxiety  Seroquel XR  To 200  MG PO QHS Increase  LiC03  To 300 mgrs BID      Medical Decision Making Problem Points:  Established problem, stable/improving (1) and Review of psycho-social stressors (1) Data Points:  Review of medication regiment & side effects (2) Review of new medications or change in dosage (2)  I certify that inpatient services furnished can reasonably be expected to improve the patient's condition.   Bridgett Hattabaugh, Madaline Guthrie 06/24/2014, 5:36 PM

## 2014-06-24 NOTE — Progress Notes (Signed)
Recreation Therapy Notes  Animal-Assisted Activity/Therapy (AAA/T) Program Checklist/Progress Notes Patient Eligibility Criteria Checklist & Daily Group note for Rec Tx Intervention  Date: 07.14.2015 Time: 3:15pm [address]   AAA/T Program Assumption of Risk Form signed by Patient/ or Parent Legal Guardian yes  Patient is free of allergies or sever asthma yes  Patient reports no fear of animals yes  Patient reports no history of cruelty to animals yes   Patient understands his/her participation is voluntary yes  Behavioral Response: Did not attend.   Marykay Lexenise L Jamesrobert Ohanesian, LRT/CTRS  Jaevian Shean L 06/24/2014 5:10 PM
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
## 2014-06-25 MED ORDER — SERTRALINE HCL 100 MG PO TABS
100.0000 mg | ORAL_TABLET | Freq: Every day | ORAL | Status: DC
  Administered 2014-06-26 – 2014-06-30 (×5): 100 mg via ORAL
  Filled 2014-06-25 (×7): qty 1

## 2014-06-25 MED ORDER — GABAPENTIN 300 MG PO CAPS
600.0000 mg | ORAL_CAPSULE | Freq: Three times a day (TID) | ORAL | Status: DC
  Administered 2014-06-25 – 2014-06-30 (×15): 600 mg via ORAL
  Filled 2014-06-25 (×20): qty 2

## 2014-06-25 MED ORDER — LAMOTRIGINE 25 MG PO TABS
25.0000 mg | ORAL_TABLET | Freq: Every day | ORAL | Status: DC
  Administered 2014-06-25 – 2014-06-30 (×6): 25 mg via ORAL
  Filled 2014-06-25 (×9): qty 1

## 2014-06-25 NOTE — Progress Notes (Signed)
Patient ID: Alyssa [name], female   DOB: 1976/06/01, 38 y.o.   MRN: [id number] [facility] MD Progress Note  06/25/2014 1:25 PM Alyssa [name]  MRN:  [id number] Subjective:  Patient continues to report feeling sad, but at the same time irritable, angry. Complains of chronic pain, only partially alleviated by current doses.  Objective :  She continues to present with dysphoria, and states that she continues to feel, as above, irritable and depressed. We have reviewed medication issues- it appears Seroquel XR may be contributing to feeling overly sedated and sluggish and possibly weight gain. She does report lithium has been helpful in the past, but feels " it has not started working yet". Some group participation, today has been more isolative. No disruptive behaviors on unit.      Diagnosis:  Bipolar, Depressed, Major Depression, Recurrent severe    Total Time spent with patient: 20 minutes    ADL's: fair   Sleep:Fair  Appetite:  Good  Suicidal Ideation:  At this time denies any suicidal ideations  Homicidal Ideation: At this time  denies AEB (as evidenced by):  Psychiatric Specialty Exam: Physical Exam  Review of Systems  Musculoskeletal: Positive for back pain.  Psychiatric/Behavioral: Positive for depression. The patient is nervous/anxious.     Blood pressure 121/87, pulse 100, temperature 98.1 F (36.7 C), temperature source Oral, resp. rate 20, height 5\' 6"  (1.676 m), weight 144.244 kg (318 lb), last menstrual period 06/12/2014, SpO2 97.00%.Body mass index is 51.35 kg/(m^2).  General Appearance: Fairly Groomed  Patent attorney::  Good  Speech:  Clear and Coherent  Volume:  Normal  Mood:  Remains depressed, somewhat dysphoric   Affect:  Congruent,  Constricted, feels subjectively irritable but presents sad rather than angry  Thought Process:  Goal Directed  Orientation:  Full (Time, Place, and Person)  Thought Content: Currently denies auditory hallucinations.   Suicidal  Thoughts:  Some vague  passive thoughts about dying but no suicidal plan or intent  Homicidal Thoughts:  No  Memory:  NA   Judgement:  Fair   Insight: Fair   Psychomotor Activity: decreased   Concentration:  Fair  Recall:  [employer] of Knowledge:Fair  Language: good  Akathisia:  Yes  Handed:  Right  AIMS (if indicated):     Assets:  Communication Skills Desire for Improvement  Sleep:  Number of Hours: 4.5   Musculoskeletal: Strength & Muscle Tone: within normal limits Gait & Station: normal Patient leans: N/A  Current Medications: Current Facility-Administered Medications  Medication Dose Route Frequency Provider Last Rate Last Dose  . acetaminophen (TYLENOL) tablet 650 mg  650 mg Oral Q6H PRN Kristeen Mans, NP      . alum & mag hydroxide-simeth (MAALOX/MYLANTA) 200-200-20 MG/5ML suspension 30 mL  30 mL Oral Q4H PRN Kristeen Mans, NP      . cholecalciferol (VITAMIN D) tablet 5,000 Units  5,000 Units Oral Daily Kristeen Mans, NP   5,000 Units at 06/25/14 0756  . clonazePAM (KLONOPIN) tablet 0.5 mg  0.5 mg Oral TID PRN Oletta Darter, MD   0.5 mg at 06/25/14 0801  . gabapentin (NEURONTIN) capsule 600 mg  600 mg Oral TID Nehemiah Massed, MD      . hydrALAZINE (APRESOLINE) tablet 10 mg  10 mg Oral 3 times per day Kristeen Mans, NP   10 mg at 06/25/14 0756  . lamoTRIgine (LAMICTAL) chewable tablet 25 mg  25 mg Oral Daily Nehemiah Massed, MD      .  lithium carbonate capsule 300 mg  300 mg Oral BID WC Nehemiah MassedFernando Cobos, MD   300 mg at 06/25/14 0801  . magnesium hydroxide (MILK OF MAGNESIA) suspension 30 mL  30 mL Oral Daily PRN Kristeen MansFran E Hobson, NP      . mometasone-formoterol (DULERA) 100-5 MCG/ACT inhaler 2 puff  2 puff Inhalation BID Kristeen MansFran E Hobson, NP   2 puff at 06/25/14 0803  . nicotine (NICODERM CQ - dosed in mg/24 hours) patch 21 mg  21 mg Transdermal Daily Kristeen MansFran E Hobson, NP   21 mg at 06/25/14 0803  . pantoprazole (PROTONIX) EC tablet 80 mg  80 mg Oral Q1200 Kristeen MansFran E Hobson, NP   80 mg at  06/25/14 1202  . potassium chloride (K-DUR,KLOR-CON) CR tablet 10 mEq  10 mEq Oral Daily Kristeen MansFran E Hobson, NP   10 mEq at 06/25/14 0757  . [START ON 06/26/2014] sertraline (ZOLOFT) tablet 100 mg  100 mg Oral Daily Nehemiah MassedFernando Cobos, MD      . traMADol Janean Sark(ULTRAM) tablet 100 mg  100 mg Oral Q6H PRN Oletta DarterSalina Agarwal, MD   100 mg at 06/25/14 [id number]    Lab Results:  Results for orders placed during the hospital encounter of 06/20/14 (from the past 48 hour(s))  TSH     Status: None   Collection Time    06/24/14  6:20 AM      Result Value Ref Range   TSH 1.160  0.350 - 4.500 uIU/mL   [facility]    Physical Findings: AIMS: Facial and Oral Movements Muscles of Facial Expression: None, normal Lips and Perioral Area: None, normal Jaw: None, normal Tongue: None, normal,Extremity Movements Upper (arms, wrists, hands, fingers): None, normal Lower (legs, knees, ankles, toes): None, normal, Trunk Movements Neck, shoulders, hips: None, normal, Overall Severity Severity of abnormal movements (highest score from questions above): None, normal Incapacitation due to abnormal movements: None, normal Patient's awareness of abnormal movements (rate only patient's report): No Awareness, Dental Status Current problems with teeth and/or dentures?: No Does patient usually wear dentures?: No  CIWA:    COWS:     Assessment: Patient remains depressed  and sad/ constricted in affect , with a subjective sense of irritability and anger as well, but with behavior in good control, and without any current symptoms of mania. Some of her irritability may be related to chronic pain, and also describes a sense of sluggishness, being overmedicated from Seroquel. History of good response to Lithium in the past and so far has tolerated lithium- started two days ago- well.   Treatment Plan Summary: Daily contact with patient to assess and evaluate symptoms and progress in treatment Medication  management  Plan: Continue inpatient treatment, support, milieu Increase Zoloft to 100 mgrspo qD for depression Increase  Gabapentin  To 600 mg po T ID to address chronic pain and ongoing anxiety Clonazepam 0.5 mg po to TID PRN Anxiety D/C Seroquel XR   Continue  LiC03   300 mgrs BID  Start Lamictal 25 mgrs BID for mood- we reviewed side effect and rationale.     Medical Decision Making Problem Points:  Established problem, stable/improving (1) and Review of psycho-social stressors (1) Data Points:  Review of medication regiment & side effects (2) Review of new medications or change in dosage (2)  I certify that inpatient services furnished can reasonably be expected to improve the patient's condition.   COBOS, FERNANDO 06/25/2014, 1:25 PM

## 2014-06-25 NOTE — Progress Notes (Signed)
Adult Psychoeducational Group Note  Date:  06/25/2014 Time:  10:51 PM  Group Topic/Focus:  Wrap-Up Group:   The focus of this group is to help patients review their daily goal of treatment and discuss progress on daily workbooks.  Participation Level:  Active  Participation Quality:  Appropriate  Affect:  Appropriate  Cognitive:  Appropriate  Insight: Appropriate  Engagement in Group:  Engaged  Modes of Intervention:  Discussion  Additional Comments: The patient expressed that she learned about emotions in group.The patient said that she learned that you cant please everybody.  Octavio Mannshigpen, Renn Dirocco Lee 06/25/2014, 10:51 PM

## 2014-06-25 NOTE — Progress Notes (Signed)
D: pt stated today hasn't been such a good day. Pt did not go into detail as to why that is. Denies si/hi/avh. Rates pain 6/10 in back. Pt is appropriate on unit. Seen engaging with others. Pt cpap machine not working properly. [facility] respiratory called for another machine, machine was never brought over. A: ultram given for pain. scheduled medications given. q 15 min safety checks. Support and encouragement offered R: pt remains safe on unit. No signs of distress noted.

## 2014-06-25 NOTE — [facility] Group Notes (Signed)
[facility]   06/25/2014 10:37 AM    Participation Quality: Patient did not attend group.     Alyssa [name], Alyssa [name]

## 2014-06-25 NOTE — Progress Notes (Signed)
D: Patient denies SI/HI and A/V hallucinations; patient reports sleep is poor and she did not have her cpap machine to use; reports appetite is good ; reports energy level is low; reports ability to pay attention is poor; rates depression as 5/10; rates hopelessness 5/10; rates anxiety as 7/10;   A: Monitored q 15 minutes; patient encouraged to attend groups; patient educated about medications; patient given medications per physician orders; patient encouraged to express feelings and/or concerns  R: Patient is flat, sad, and blunted; patient has expressed concerns about her medication and also concern over dry mouth; patient was given ice chips to help with dry mouth ; patient's interaction with staff and peers is minimal; patient was able to set goal to talk with staff 1:1 when having feelings of SI; patient is taking medications as prescribed and tolerating medications

## 2014-06-25 NOTE — [facility] Group Notes (Signed)
[facility] LCSW Group Therapy  Emotional Regulation 1:15 - 2: 30 PM        06/25/2014     Type of Therapy:  Group Therapy  Participation Level:  Appropriate  Participation Quality:  Appropriate  Affect:  Appropriate  Cognitive:  Attentive Appropriate  Insight:  Developing/Improving Engaged  Engagement in Therapy:  Developing/Improving Engaged  Modes of Intervention:  Discussion Exploration Problem-Solving Supportive  Summary of Progress/Problems:  Group topic was emotional regulations.  Patient participated in the discussion and was able to identify an emotion that needed to regulated.  She shared the emotion she has to control is emptiness.  She agreed with another patient in being a giving person who often gets noting in return.  Patient was able to identify approprite coping skills.  Wynn BankerHodnett, [place] Yepiz Hairston 06/25/2014

## 2014-06-25 NOTE — Progress Notes (Signed)
Patient ID: Alyssa [name], female   DOB: 1976-08-06, 38 y.o.   MRN: [id number] D: Patient mood and affect appeared depressed and sad. Pt report increase feeling of depression r/o chronic back pain. Pt reports her medication is not getting the pain down. Pt reports she takes percocet at home but has been refused here.  Pt denies SI/HI/AVH. Pt attended evening wrap up group and engage in discussion.   Cooperative with assessment. No acute distressed noted at this time.   A: Met with pt 1:1. Writer reminded pt about not getting percocet because she OD on them. Scheduled and PRN meds administered as prescribed. Writer encouraged pt to discuss feelings. Pt encouraged to come to staff with any question or concerns.   R: Patient remains safe. She is complaint with medications and denies any adverse reaction. Continue current POC.

## 2014-06-26 NOTE — Progress Notes (Signed)
Patient ID: Alyssa EspyMitsy M Brooks, female   DOB: March 11, 1976, 38 y.o.   MRN: [id number] D: Client visible on milieu in dayroom and on phone, affect brighter, reports day "a little better" says her sister and BF with her baby came in to see her today. Client smiles occassionally, reports she hadn't heard from the kids in ten days. "I was hoping they would come" A: Writer Provided emotional support, encourage client to look ahead, adhere to positive coping skills. Staff will monitor q5815min for safety. R: Pt. Is safe on the unit, attended karaoke.

## 2014-06-26 NOTE — Progress Notes (Signed)
The focus of this group is to educate the patient on the purpose and policies of crisis stabilization and provide a format to answer questions about their admission.  The group details unit policies and expectations of patients while admitted.  Patient did not attend morning nurse education orientation group.  Patient stayed in bed sleeping.  

## 2014-06-26 NOTE — [facility] Group Notes (Signed)
[facility] LCSW Group Therapy  [facility] 1:15 - 2:30 PM  06/26/2014   Type of Therapy:  Group Therapy  Participation Level: Minimal  Participation Quality:  Attentive  Affect:  Appropriate  Cognitive:  Appropriate  Insight:  Developing/Improving and Engaged  Engagement in Therapy:  Developing/Improving Engaged  Modes of Intervention:  Discussion, Education, Exploration, Problem-Solving, Rapport Building, Support   Summary of Progress/Problems:  Patient listened attentively to speaker from [employer]. She was very tearful throughout the presentation and expressed interest in following up with [facility] at discharge.  Wynn BankerHodnett, Jerra Huckeby Hairston 06/26/2014

## 2014-06-26 NOTE — Progress Notes (Signed)
D:  Patient's self inventory sheet, patient has fair sleep, no sleep med requested.  Good appetite, low energy level.  Rated depression and hopeless 6, anxiety 8.  Denied withdrawals.  Denied SI.  Physical problems of pain, headache, rash, pressure with urine.  Worst pain 8.  Pain in back, hips, ankle.  Takes pain medication which is not helpful.  Needs to let go of things so she can move on with life.  Try and forgive.  No discharge plans yet. A:  Medications administered per MD orders.  Emotional support and encouragement given patient. Denied SI and HI, contracts for safety.   Denied A/V hallucinations.  Safety maintained with 15 minute checks. Patient upset because she had two people in front of her this morning during medication time, but became calm.

## 2014-06-26 NOTE — Progress Notes (Signed)
Patient ID: Alyssa [name], female   DOB: [birth year], 38 y.o.   MRN: [id number] [facility] MD Progress Note  06/26/2014 3:44 PM Alyssa [name]  MRN:  [id number] Subjective:  Reports she is starting to feel " a little bit better". She feels medications are starting to help.  Objective :  Patient states she is feeling better, less depressed, and does present with a somewhat fuller range of affect and less depression/dysphoria. States she feels medications are helping , but causing her to feel " tired" and she does complain of some ongoing feeling of low energy level. As noted, she has stated that a previous lithium trial was helpful and effective ( about two years ago)  States " my racing thoughts have slowed down", and she feels " calmer" Increased group participation. We discussed diet /nutrition and the advantages of weight loss. Patient states she is aware and is working on drinking water rather than carbonated drinks or others as a way to promote weight loss.  No disruptive behaviors on unit.  TSH normal .      Diagnosis:  Bipolar, Depressed, Major Depression, Recurrent severe    Total Time spent with patient: 20 minutes    ADL's: fair   Sleep:Fair  Appetite:  Good  Suicidal Ideation:  At this time denies any suicidal ideations  Homicidal Ideation: At this time  denies AEB (as evidenced by):  Psychiatric Specialty Exam: Physical Exam  Review of Systems  Musculoskeletal: Positive for back pain.  Psychiatric/Behavioral: Positive for depression. The patient is nervous/anxious.     Blood pressure 146/93, pulse 71, temperature 98.2 F (36.8 C), temperature source Oral, resp. rate 18, height 5\' 6"  (1.676 m), weight 144.244 kg (318 lb), last menstrual period 06/12/2014, SpO2 97.00%.Body mass index is 51.35 kg/(m^2).  General Appearance: improved grooming   Eye Contact::  Good  Speech:  Clear and Coherent  Volume:  Normal  Mood:  Still depressed, but improved and less dysphoric  compared to prior  Affect:  Congruent,  Slowly becoming less constricted and less irritable  Thought Process:  Goal Directed  Orientation:  Full (Time, Place, and Person)  Thought Content: Currently denies auditory hallucinations.   Suicidal Thoughts:  No- denies any thoughts of hurting herself at this time  Homicidal Thoughts:  No  Memory:  NA   Judgement:  Fair   Insight: Fair   Psychomotor Activity: decreased   Concentration:  Fair  Recall:  [employer] of Knowledge:Fair  Language: good  Akathisia:  Yes  Handed:  Right  AIMS (if indicated):     Assets:  Communication Skills Desire for Improvement  Sleep:  Number of Hours: 6   Musculoskeletal: Strength & Muscle Tone: within normal limits Gait & Station: normal Patient leans: N/A  Current Medications: Current Facility-Administered Medications  Medication Dose Route Frequency Provider Last Rate Last Dose  . acetaminophen (TYLENOL) tablet 650 mg  650 mg Oral Q6H PRN Kristeen Mans, NP      . alum & mag hydroxide-simeth (MAALOX/MYLANTA) 200-200-20 MG/5ML suspension 30 mL  30 mL Oral Q4H PRN Kristeen Mans, NP      . cholecalciferol (VITAMIN D) tablet 5,000 Units  5,000 Units Oral Daily Kristeen Mans, NP   5,000 Units at 06/26/14 0800  . clonazePAM (KLONOPIN) tablet 0.5 mg  0.5 mg Oral TID PRN Oletta Darter, MD   0.5 mg at 06/26/14 0806  . gabapentin (NEURONTIN) capsule 600 mg  600 mg Oral TID Nehemiah Massed, MD  600 mg at 06/26/14 1154  . hydrALAZINE (APRESOLINE) tablet 10 mg  10 mg Oral 3 times per day Kristeen MansFran E Hobson, NP   10 mg at 06/26/14 1425  . lamoTRIgine (LAMICTAL) tablet 25 mg  25 mg Oral Daily Nehemiah MassedFernando Cobos, MD   25 mg at 06/26/14 0801  . lithium carbonate capsule 300 mg  300 mg Oral BID WC Nehemiah MassedFernando Cobos, MD   300 mg at 06/26/14 0802  . magnesium hydroxide (MILK OF MAGNESIA) suspension 30 mL  30 mL Oral Daily PRN Kristeen MansFran E Hobson, NP      . mometasone-formoterol (DULERA) 100-5 MCG/ACT inhaler 2 puff  2 puff Inhalation BID  Kristeen MansFran E Hobson, NP   2 puff at 06/26/14 0802  . nicotine (NICODERM CQ - dosed in mg/24 hours) patch 21 mg  21 mg Transdermal Daily Kristeen MansFran E Hobson, NP   21 mg at 06/26/14 0802  . pantoprazole (PROTONIX) EC tablet 80 mg  80 mg Oral Q1200 Kristeen MansFran E Hobson, NP   80 mg at 06/26/14 1154  . potassium chloride (K-DUR,KLOR-CON) CR tablet 10 mEq  10 mEq Oral Daily Kristeen MansFran E Hobson, NP   10 mEq at 06/26/14 0803  . sertraline (ZOLOFT) tablet 100 mg  100 mg Oral Daily Nehemiah MassedFernando Cobos, MD   100 mg at 06/26/14 0803  . traMADol (ULTRAM) tablet 100 mg  100 mg Oral Q6H PRN Oletta DarterSalina Agarwal, MD   100 mg at 06/26/14 1428    Lab Results:  No results found for this or any previous visit (from the past 48 hour(s)).  Physical Findings: AIMS: Facial and Oral Movements Muscles of Facial Expression: None, normal Lips and Perioral Area: None, normal Jaw: None, normal Tongue: None, normal,Extremity Movements Upper (arms, wrists, hands, fingers): None, normal Lower (legs, knees, ankles, toes): None, normal, Trunk Movements Neck, shoulders, hips: None, normal, Overall Severity Severity of abnormal movements (highest score from questions above): None, normal Incapacitation due to abnormal movements: None, normal Patient's awareness of abnormal movements (rate only patient's report): No Awareness, Dental Status Current problems with teeth and/or dentures?: No Does patient usually wear dentures?: No  CIWA:  CIWA-Ar Total: 3 COWS:  COWS Total Score: 4  Assessment: Although still depressed, sad, she is starting to feel better and is presenting with a less dysphoric and irritable mood. She also states that she has less feelings of racing thoughts, and appears calmer overall.  She is now on Lamictal and Lithium and so far she is tolerating these well.    Treatment Plan Summary: Daily contact with patient to assess and evaluate symptoms and progress in treatment Medication management  Plan: Continue inpatient treatment,  support, milieu Zoloft  100 mgrs po qD for depression Gabapentin   600 mg po TID to address chronic pain and ongoing anxiety Clonazepam 0.5 mg po to TID PRN Anxiety Continue  LiC03   300 mgrs BID  Will order routine Li level for tomorrow morning  Lamictal 25 mgrs BID      Medical Decision Making Problem Points:  Established problem, stable/improving (1) and Review of psycho-social stressors (1) Data Points:  Review or order clinical lab tests (1) Review of medication regiment & side effects (2)  I certify that inpatient services furnished can reasonably be expected to improve the patient's condition.   COBOS, FERNANDO 06/26/2014, 3:44 PM

## 2014-06-27 LAB — LITHIUM LEVEL: Lithium Lvl: <0.25 mEq/L — ABNORMAL LOW (ref 0.80–1.40)

## 2014-06-27 MED ORDER — ARIPIPRAZOLE 5 MG PO TABS
5.0000 mg | ORAL_TABLET | Freq: Every day | ORAL | Status: DC
  Administered 2014-06-27 – 2014-06-30 (×4): 5 mg via ORAL
  Filled 2014-06-27 (×6): qty 1

## 2014-06-27 MED ORDER — LITHIUM CARBONATE 300 MG PO CAPS
300.0000 mg | ORAL_CAPSULE | Freq: Three times a day (TID) | ORAL | Status: DC
  Administered 2014-06-27 – 2014-06-30 (×9): 300 mg via ORAL
  Filled 2014-06-27 (×14): qty 1

## 2014-06-27 NOTE — Progress Notes (Signed)
Patient ID: Alyssa [name], female   DOB: 07-06-1976, 38 y.o.   MRN: [id number] [facility] MD Progress Note  06/27/2014 3:39 PM Alyssa [name]  MRN:  [id number] Subjective:  Patient states she is not doing well today, which she attributes to having a headache and to her ex boyfriend calling her via phone earlier today.  Objective :  Patient presents depressed, sad, and intermittently tearful. She states that her boyfriend of 14 years , who recently left her - 6 weeks ago- called her on the phone and stated he still loved her, " which makes me feel even more confused". She is ruminative that family does not seem to be as supportive as she was hoping they would be.  Denies side effects from medications. No disruptive behaviors on unit. Patient's initial dysphoria and irritability seems much improved, and she is more pleasant and better related overall. She is going to groups.  Li level low/ sub-therapeutic.      Diagnosis:  Bipolar, Depressed, Major Depression, Recurrent severe    Total Time spent with patient: 20 minutes    ADL's: fair   Sleep:Fair  Appetite:  Good  Suicidal Ideation:  At this time denies any suicidal ideations  Homicidal Ideation: At this time  denies AEB (as evidenced by):  Psychiatric Specialty Exam: Physical Exam  Review of Systems  Musculoskeletal: Positive for back pain.  Psychiatric/Behavioral: Positive for depression. The patient is nervous/anxious.     Blood pressure 122/86, pulse 80, temperature 98.1 F (36.7 C), temperature source Oral, resp. rate 18, height 5\' 6"  (1.676 m), weight 144.244 kg (318 lb), last menstrual period 06/12/2014, SpO2 97.00%.Body mass index is 51.35 kg/(m^2).  General Appearance: improved grooming   Eye Contact::  Good  Speech:  Clear and Coherent  Volume:  Normal  Mood:  Remains depressed  Affect: Sad, intermittently tearful affect  Thought Process:  Goal Directed  Orientation:  Full (Time, Place, and Person)  Thought  Content: Currently denies auditory hallucinations.   Suicidal Thoughts:  No- denies any thoughts of hurting herself at this time  Homicidal Thoughts:  No  Memory:  NA   Judgement:  Fair   Insight: Fair   Psychomotor Activity: decreased   Concentration:  Fair  Recall:  [employer] of Knowledge:Fair  Language: good  Akathisia:  Yes  Handed:  Right  AIMS (if indicated):     Assets:  Communication Skills Desire for Improvement  Sleep:  Number of Hours: 6   Musculoskeletal: Strength & Muscle Tone: within normal limits Gait & Station: normal Patient leans: N/A  Current Medications: Current Facility-Administered Medications  Medication Dose Route Frequency Provider Last Rate Last Dose  . acetaminophen (TYLENOL) tablet 650 mg  650 mg Oral Q6H PRN Kristeen Mans, NP   650 mg at 06/27/14 0439  . alum & mag hydroxide-simeth (MAALOX/MYLANTA) 200-200-20 MG/5ML suspension 30 mL  30 mL Oral Q4H PRN Kristeen Mans, NP      . cholecalciferol (VITAMIN D) tablet 5,000 Units  5,000 Units Oral Daily Kristeen Mans, NP   5,000 Units at 06/27/14 [phone]  . clonazePAM (KLONOPIN) tablet 0.5 mg  0.5 mg Oral TID PRN Oletta Darter, MD   0.5 mg at 06/27/14 1501  . gabapentin (NEURONTIN) capsule 600 mg  600 mg Oral TID Nehemiah Massed, MD   600 mg at 06/27/14 1159  . hydrALAZINE (APRESOLINE) tablet 10 mg  10 mg Oral 3 times per day Kristeen Mans, NP   10 mg  at 06/27/14 1459  . lamoTRIgine (LAMICTAL) tablet 25 mg  25 mg Oral Daily Nehemiah Massed, MD   25 mg at 06/27/14 0741  . lithium carbonate capsule 300 mg  300 mg Oral BID WC Nehemiah Massed, MD   300 mg at 06/27/14 0741  . magnesium hydroxide (MILK OF MAGNESIA) suspension 30 mL  30 mL Oral Daily PRN Kristeen Mans, NP      . mometasone-formoterol (DULERA) 100-5 MCG/ACT inhaler 2 puff  2 puff Inhalation BID Kristeen Mans, NP   2 puff at 06/27/14 [phone]  . nicotine (NICODERM CQ - dosed in mg/24 hours) patch 21 mg  21 mg Transdermal Daily Kristeen Mans, NP   21 mg at  06/27/14 0742  . pantoprazole (PROTONIX) EC tablet 80 mg  80 mg Oral Q1200 Kristeen Mans, NP   80 mg at 06/27/14 1159  . potassium chloride (K-DUR,KLOR-CON) CR tablet 10 mEq  10 mEq Oral Daily Kristeen Mans, NP   10 mEq at 06/27/14 0741  . sertraline (ZOLOFT) tablet 100 mg  100 mg Oral Daily Nehemiah Massed, MD   100 mg at 06/27/14 0741  . traMADol (ULTRAM) tablet 100 mg  100 mg Oral Q6H PRN Oletta Darter, MD   100 mg at 06/27/14 9604    Lab Results:  Results for orders placed during the hospital encounter of 06/20/14 (from the past 48 hour(s))  LITHIUM LEVEL     Status: Abnormal   Collection Time    06/27/14  6:38 AM      Result Value Ref Range   Lithium Lvl <0.25 (*) 0.80 - 1.40 mEq/L   Comment: Performed at [facility]    Physical Findings: AIMS: Facial and Oral Movements Muscles of Facial Expression: None, normal Lips and Perioral Area: None, normal Jaw: None, normal Tongue: None, normal,Extremity Movements Upper (arms, wrists, hands, fingers): None, normal Lower (legs, knees, ankles, toes): None, normal, Trunk Movements Neck, shoulders, hips: None, normal, Overall Severity Severity of abnormal movements (highest score from questions above): None, normal Incapacitation due to abnormal movements: None, normal Patient's awareness of abnormal movements (rate only patient's report): No Awareness, Dental Status Current problems with teeth and/or dentures?: No Does patient usually wear dentures?: No  CIWA:  CIWA-Ar Total: 3 COWS:  COWS Total Score: 4  Assessment: Today remains sad, depressed, and tearful. No SI. She attributes her depression today to a call from her boyfriend, who had left her a few weeks ago. She is tolerating Lamictal and Lithium well. She has been on lithium before and she remembers it helped her stabilize.  As Li level low will titrate dose.  Will add Abilify , which she has also been on before- does not remember having had side effects- to  address ongoing mood symptoms. We reviewed side effects   Treatment Plan Summary: Daily contact with patient to assess and evaluate symptoms and progress in treatment Medication management  Plan: Continue inpatient treatment, support, milieu Zoloft  100 mgrs po qD for depression Gabapentin   600 mg po TID  Clonazepam 0.5 mg po to TID PRN Anxiety Increase LiC03   300 mgrs TID  Lamictal 25 mgrs BID  Start Abilify 5 mgrs QHS.    Medical Decision Making Problem Points:  Established problem, stable/improving (1) and Review of psycho-social stressors (1) Data Points:  Review or order clinical lab tests (1) Review of medication regiment & side effects (2) Review of new medications or change in dosage (2)  I certify that inpatient services furnished can reasonably be expected to improve the patient's condition.   Jurnee Nakayama, Madaline GuthrieFERNANDO 06/27/2014, 3:39 PM

## 2014-06-27 NOTE — Progress Notes (Signed)
Psychoeducational Group Note  Date:  06/27/2014 Time:  2205  Group Topic/Focus:  Wrap-Up Group:   The focus of this group is to help patients review their daily goal of treatment and discuss progress on daily workbooks.  Participation Level: Did Not Attend  Participation Quality:  Not Applicable  Affect:  Not Applicable  Cognitive:  Not Applicable  Insight:  Not Applicable  Engagement in Group: Not Applicable  Additional Comments:  The patient did not attend group this evening since she was asleep in her bedroom.   Shalena Ezzell S 06/27/2014, 10:06 PM

## 2014-06-27 NOTE — Progress Notes (Signed)
Patient ID: Alyssa [name], female   DOB: 11/22/1976, 38 y.o.   MRN: [id number]  D: Patient secluded to her room, endorsing depression and with minimal interaction. Pt with dull, flat affect.  A: Q 15 minute safety checks, encourage staff/peer interaction and group participation. Administer medications as ordered by MD.  R: Patient compliant with medications but did not attend group session. Pt denies SI or plans to harm herself. No s/s of distress noted.

## 2014-06-27 NOTE — Tx Team (Signed)
Interdisciplinary Treatment Plan Update   Date Reviewed:  06/27/2014  Time Reviewed:  8:40 AM  Progress in Treatment:   Attending groups: Yes Participating in groups: Yes Taking medication as prescribed: Yes  Tolerating medication: Yes Family/Significant other contact made:  Yes, collateral contact with sister. Patient understands diagnosis: Yes  Discussing patient identified problems/goals with staff: Yes Medical problems stabilized or resolved: Yes Denies suicidal/homicidal ideation: Yes Patient has not harmed self or others: Yes  For review of initial/current patient goals, please see plan of care.  Estimated Length of Stay:  3 - 4 days  Reasons for Continued Hospitalization:  Anxiety Depression Medication stabilization  New Problems/Goals identified:    Discharge Plan or Barriers:   Home with outpatient follow up with [facility]  Additional Comments:  Continue stabilization  Attendees:  Patient:  06/27/2014 8:40 AM   Signature:  Sallyanne HaversF. Cobos, MD 06/27/2014 8:40 AM  Signature:  06/27/2014 8:40 AM  Signature:   06/27/2014 8:40 AM  Signature:  Harold Barbanonecia Byrd, RN 06/27/2014 8:40 AM  Signature:  Deidre AlaPattie Dukes,  RN 06/27/2014 8:40 AM  Signature:  Juline PatchQuylle Xia Stohr, LCSW 06/27/2014 8:40 AM  Signature:  Reyes Ivanhelsea Horton, LCSW 06/27/2014 8:40 AM  Signature:   06/27/2014 8:40 AM  Signature:   06/27/2014 8:40 AM  Signature: Onnie BoerJennifer Clark, RN - UR CM 06/27/2014  8:40 AM  Signature:   06/27/2014  8:40 AM  Signature: 06/27/2014  8:40 AM    Scribe for Treatment Team:   Juline PatchQuylle Florabelle Cardin,  06/27/2014 8:40 AM

## 2014-06-27 NOTE — [facility] Group Notes (Signed)
[facility] LCSW Group Therapy  Feelings Around Relapse 1:15 -2:30        06/27/2014   Type of Therapy:  Group Therapy  Participation Level:  Appropriate  Participation Quality:  Appropriate  Affect:  Appropriate  Cognitive:  Attentive Appropriate  Insight:  Developing/Improving  Engagement in Therapy: Developing/Improving  Modes of Intervention:  Discussion Exploration Problem-Solving Supportive  Summary of Progress/Problems:  The topic for today was feelings around relapse.    Patient processed feelings toward relapse and was able to relate to peers. Patient shared relapsing for her is making impulsive decision.  Patient identified coping skills that can be used to prevent a relapse including taking time to stop and talk with other about what she planning.   Wynn BankerHodnett, Tristin Gladman Hairston 06/27/2014

## 2014-06-27 NOTE — Progress Notes (Signed)
D: Patient appropriate and cooperative with staff and peers. Patient has depressed affect and mood. She reported on the self inventory sheet that her sleep, appetite and ability to concentrate are all good and energy level is normal. Patient is actively participating in groups and visible in the milieu. Today, she rates depression "4", feelings of hopelessness "5" and anxiety "6". Patient compliant with medication regimen.  A: Support and encouragement provided to patient. Scheduled medications administered per MD orders. Maintain Q15 minute checks for safety.  R: Patient receptive. Denies SI/HI/AVH. Patient remains safe.

## 2014-06-27 NOTE — [facility] Group Notes (Signed)
[facility]   06/27/2014 10:45 AM  Participation Quality:  Patient did not attend group.    Alyssa [name], Joesph JulyQuylle Hairston

## 2014-06-28 DIAGNOSIS — F3130 Bipolar disorder, current episode depressed, mild or moderate severity, unspecified: Secondary | ICD-10-CM

## 2014-06-28 NOTE — [facility] Group Notes (Signed)
[facility] Group Notes:  (Nursing/MHT/Case Management/Adjunct)  Date:  06/28/2014  Time:  11:29 AM  Type of Therapy:  Psychoeducational Skills  Participation Level:  Active  Participation Quality:  Appropriate  Affect:  Appropriate  Cognitive:  Appropriate  Insight:  Appropriate  Engagement in Group:  Engaged  Modes of Intervention:  Discussion  Summary of Progress/Problems: Pt did attend self inventory group, pt reported that she was negative SI/HI, no AH/VH noted. Pt rated her depression as a 3, and her helplessness/hopelessness as a 3.     Pt reported concerns about being discharged today, pt advised that the doctor will be made aware.   Jacquelyne BalintForrest, Deshay Blumenfeld Shanta 06/28/2014, 11:29 AM

## 2014-06-28 NOTE — [facility] Group Notes (Signed)
[facility] Group Notes:  (Clinical Social Work)  06/28/2014   1:15-2:15PM  Summary of Progress/Problems:   The main focus of today's process group was for the patient to identify ways in which they have sabotaged their own mental health wellness/recovery.  Motivational interviewing and a handout were used to explore the benefits and costs of their self-sabotaging behavior as well as the benefits and costs of changing this behavior.  The Stages of Change were explained to the group using a handout, and patients identified where they are with regard to changing self-defeating behaviors.  The patient expressed little in group, but did make some contributions and appeared to pay attention.  Type of Therapy:  Process Group  Participation Level:  Active  Participation Quality:  Attentive  Affect:  Blunted and Depressed  Cognitive:  Appropriate and Oriented  Insight:  Developing/Improving  Engagement in Therapy:  Developing/Improving  Modes of Intervention:  Education, Motivational Interviewing   Ambrose MantleMareida Grossman-Orr, LCSW 06/28/2014, 4:00pm

## 2014-06-28 NOTE — Progress Notes (Signed)
Patient ID: [name], female   DOB: 02/05/1976, 38 y.o.   MRN: [id number]  D: Pt has been very flat and depressed on the unit today, due to not being able to be discharged. Pt reported that she just wanted to get home to her children, and [facility] Pt did attend morning group, but after she found out she was not going to be discharged she was in the bed most of the day. Pt took all of her medication without any problems, no issues or concerns noted. Pt reported being negative SI/HI, no AH/VH noted. A: 15 min checks continued for patient safety. R: Pt safety maintained.

## 2014-06-28 NOTE — Progress Notes (Signed)
Patient ID: Alyssa [name], female   DOB: July 19, 1976, 38 y.o.   MRN: [id number] [facility] MD Progress Note  06/28/2014 9:29 AM Alyssa [name]  MRN:  [id number] Subjective:  Patient states she is doing ok, she had a really bad day yesterday. She received two bad phone calls yesterday, her friend was found dead this morning and her cousin was also involved in a serious car accident and fractured his spine. She is requesting to go home due to the recent chain events. She reports not seeing her kids in over 12 days. She reports compliance with medication at this time. She states she feels pretty balanced, and understands that life goes on. She is interested in joining [facility] [place], and she would like to attend the 9 week anger management group at [facility]. Currently rates her depression 3/10, anxiety 5/10, and hopelessness 2/10.   Objective :  Patient presents depressed, sad, and intermittently tearful. She is ruminative that her family members are going through a lot which is why she needs to go home. She notes having a lot of support at home. She slept all day yesterday, very tearful throughout the day although she wrote down her feelings. Denies side effects from medications. No disruptive behaviors on unit. She did not attend any groups yesterday.     Diagnosis:  Bipolar, Depressed, Major Depression, Recurrent severe   Total Time spent with patient: 20 minutes  ADL's: fair   Sleep:Fair  Appetite:  Good  Suicidal Ideation:  At this time denies any suicidal ideations  Homicidal Ideation: At this time  denies AEB (as evidenced by):  Psychiatric Specialty Exam: Physical Exam   Review of Systems  Musculoskeletal: Positive for back pain.  Psychiatric/Behavioral: Positive for depression. The patient is nervous/anxious.     Blood pressure 122/86, pulse 80, temperature 98.1 F (36.7 C), temperature source Oral, resp. rate 18, height 5\' 6"  (1.676 m), weight 144.244 kg (318 lb), last  menstrual period 06/12/2014, SpO2 97.00%.Body mass index is 51.35 kg/(m^2).  General Appearance: improved grooming   Eye Contact::  Good  Speech:  Clear and Coherent  Volume:  Normal  Mood:  Remains depressed  Affect: Sad, intermittently tearful affect  Thought Process:  Goal Directed  Orientation:  Full (Time, Place, and Person)  Thought Content: Currently denies auditory hallucinations.   Suicidal Thoughts:  No- denies any thoughts of hurting herself at this time  Homicidal Thoughts:  No  Memory:  NA   Judgement:  Fair   Insight: Fair   Psychomotor Activity: decreased   Concentration:  Fair  Recall:  [employer] of Knowledge:Fair  Language: good  Akathisia:  Yes  Handed:  Right  AIMS (if indicated):     Assets:  Communication Skills Desire for Improvement  Sleep:  Number of Hours: 5   Musculoskeletal: Strength & Muscle Tone: within normal limits Gait & Station: normal Patient leans: N/A  Current Medications: Current Facility-Administered Medications  Medication Dose Route Frequency Provider Last Rate Last Dose  . acetaminophen (TYLENOL) tablet 650 mg  650 mg Oral Q6H PRN Kristeen Mans, NP   650 mg at 06/27/14 0439  . alum & mag hydroxide-simeth (MAALOX/MYLANTA) 200-200-20 MG/5ML suspension 30 mL  30 mL Oral Q4H PRN Kristeen Mans, NP      . ARIPiprazole (ABILIFY) tablet 5 mg  5 mg Oral Daily Nehemiah Massed, MD   5 mg at 06/28/14 0746  . cholecalciferol (VITAMIN D) tablet 5,000 Units  5,000 Units Oral  Daily Kristeen Mans, NP   5,000 Units at 06/28/14 0745  . clonazePAM (KLONOPIN) tablet 0.5 mg  0.5 mg Oral TID PRN Oletta Darter, MD   0.5 mg at 06/28/14 0745  . gabapentin (NEURONTIN) capsule 600 mg  600 mg Oral TID Nehemiah Massed, MD   600 mg at 06/28/14 0745  . hydrALAZINE (APRESOLINE) tablet 10 mg  10 mg Oral 3 times per day Kristeen Mans, NP   10 mg at 06/28/14 4098  . lamoTRIgine (LAMICTAL) tablet 25 mg  25 mg Oral Daily Nehemiah Massed, MD   25 mg at 06/28/14 0745  .  lithium carbonate capsule 300 mg  300 mg Oral TID WC Nehemiah Massed, MD   300 mg at 06/28/14 1191  . magnesium hydroxide (MILK OF MAGNESIA) suspension 30 mL  30 mL Oral Daily PRN Kristeen Mans, NP      . mometasone-formoterol (DULERA) 100-5 MCG/ACT inhaler 2 puff  2 puff Inhalation BID Kristeen Mans, NP   2 puff at 06/28/14 0746  . nicotine (NICODERM CQ - dosed in mg/24 hours) patch 21 mg  21 mg Transdermal Daily Kristeen Mans, NP   21 mg at 06/28/14 0746  . pantoprazole (PROTONIX) EC tablet 80 mg  80 mg Oral Q1200 Kristeen Mans, NP   80 mg at 06/27/14 1159  . potassium chloride (K-DUR,KLOR-CON) CR tablet 10 mEq  10 mEq Oral Daily Kristeen Mans, NP   10 mEq at 06/28/14 0745  . sertraline (ZOLOFT) tablet 100 mg  100 mg Oral Daily Nehemiah Massed, MD   100 mg at 06/28/14 0745  . traMADol (ULTRAM) tablet 100 mg  100 mg Oral Q6H PRN Oletta Darter, MD   100 mg at 06/28/14 0745    Lab Results:  Results for orders placed during the hospital encounter of 06/20/14 (from the past 48 hour(s))  LITHIUM LEVEL     Status: Abnormal   Collection Time    06/27/14  6:38 AM      Result Value Ref Range   Lithium Lvl <0.25 (*) 0.80 - 1.40 mEq/L   Comment: Performed at [facility]    Physical Findings: AIMS: Facial and Oral Movements Muscles of Facial Expression: None, normal Lips and Perioral Area: None, normal Jaw: None, normal Tongue: None, normal,Extremity Movements Upper (arms, wrists, hands, fingers): None, normal Lower (legs, knees, ankles, toes): None, normal, Trunk Movements Neck, shoulders, hips: None, normal, Overall Severity Severity of abnormal movements (highest score from questions above): None, normal Incapacitation due to abnormal movements: None, normal Patient's awareness of abnormal movements (rate only patient's report): No Awareness, Dental Status Current problems with teeth and/or dentures?: No Does patient usually wear dentures?: No  CIWA:  CIWA-Ar Total:  3 COWS:  COWS Total Score: 4  Assessment: Today remains sad, depressed, and tearful. No SI. She attributes her depression today to a call from her boyfriend, who had left her a few weeks ago. She is tolerating Lamictal and Lithium well. She has been on lithium before and she remembers it helped her stabilize.  As Li level low will titrate dose.  Will add Abilify , which she has also been on before- does not remember having had side effects- to address ongoing mood symptoms. We reviewed side effects   Treatment Plan Summary: Daily contact with patient to assess and evaluate symptoms and progress in treatment Medication management  Plan: Continue inpatient treatment, support, milieu Zoloft  100 mgrs po qD for depression Gabapentin  600 mg po TID  Clonazepam 0.5 mg po to TID PRN Anxiety Increase LiC03   300 mgrs TID  Lamictal 25 mgrs BID  Start Abilify 5 mgrs QHS.    Medical Decision Making Problem Points:  Established problem, stable/improving (1) and Review of psycho-social stressors (1) Data Points:  Review or order clinical lab tests (1) Review of medication regiment & side effects (2) Review of new medications or change in dosage (2)  I certify that inpatient services furnished can reasonably be expected to improve the patient's condition.   Malachy ChamberSTARKES, Yacqub Baston S FNP-BC 06/28/2014, 9:29 AM

## 2014-06-28 NOTE — Progress Notes (Signed)
[facility] Group Notes:  (Nursing/MHT/Case Management/Adjunct)  Date:  06/28/2014  Time:  9:30 PM  Type of Therapy:  Psychoeducational Skills  Participation Level:  Minimal  Participation Quality:  Attentive  Affect:  Appropriate  Cognitive:  Appropriate  Insight:  Lacking  Engagement in Group:  Limited  Modes of Intervention:  Education  Summary of Progress/Problems: The patient verbalized in group that she had a good day overall since she had two family visits. She did not mention that their were issues with her not being discharged today and that she elected to spend time in her room as a means of coping. As a theme for the day, her support system will be her family.   Alyssa [name] S 06/28/2014, 9:30 PM

## 2014-06-28 NOTE — Progress Notes (Signed)
Patient ID: Alyssa EspyMitsy M Brooks, female   DOB: 02-02-1976, 38 y.o.   MRN: [id number]  D: Patient with dull, flat affect endorsing depression. Pt states she is sad that she didn't get to go home today. Pt states she hasn't seen her children in a couple of weeks and needs to go home. Pt also mentioned that her cousin was in an accident and that she needs to leave to be with that person.  A: Q 15 minute safety checks, encourage staff/peer interaction and group participation. Administer medications as ordered by MD. Encourage sharing of feelings. R: Patient denies SI or plans to harm herself. Pt with minimal interaction and continues to have a dull, flat affect endorsing depression. Pt verbally contracts for safety.

## 2014-06-28 NOTE — Progress Notes (Signed)
Psychoeducational Group Note  Date:  06/28/2014 Time: 1100    Group Topic/Focus:  Identifying Needs:   The focus of this group is to help patients identify their personal needs that have been historically problematic and identify healthy behaviors to address their needs.  Participation Level: Did Not Attend  Participation Quality:  Not Applicable  Affect:  Not Applicable  Cognitive:  Not Applicable  Insight:  Not Applicable  Engagement in Group: Not Applicable  Additional Comments:    Rich BraveDuke, Moriya Mitchell Lynn 06/28/2014, 12:40 PM

## 2014-06-29 NOTE — Progress Notes (Signed)
Psychoeducational Group Note  Date:  06/29/2014 Time:   1100 Group Topic/Focus:  Making Healthy Choices:   The focus of this group is to help patients identify negative/unhealthy choices they were using prior to admission and identify positive/healthier coping strategies to replace them upon discharge.  Participation Level:  Minimal  Participation Quality:  Appropriate  Affect:  Depressed  Cognitive:  Oriented  Insight:  Improving  Engagement in Group:  Engaged  Additional Comments:    Rich BraveDuke, Silvio Sausedo Lynn 5:58 PM. 06/29/2014

## 2014-06-29 NOTE — Progress Notes (Signed)
Patient ID: Alyssa [name], female   DOB: [birth year], 38 y.o.   MRN: [id number]   D: Pt has been appropriate on the unit today, she reported that she was feeling much better and was looking forward to being discharged in the morning. Pt reported that her medication regimen is helping and that she feel stable. Pt took all medication without any problems, no issues or concerns noted. Pt reported being negative SI/HI, no AH/VH noted. A: 15 min checks continued for patient safety. R: Pt safety maintained.

## 2014-06-29 NOTE — Progress Notes (Signed)
[facility] Group Notes:  (Nursing/MHT/Case Management/Adjunct)  Date:  06/29/2014  Time:  9:23 PM  Type of Therapy:  Psychoeducational Skills  Participation Level:  Minimal  Participation Quality:  Attentive  Affect:  Depressed  Cognitive:  Lacking  Insight:  Lacking  Engagement in Group:  Poor  Modes of Intervention:  Education  Summary of Progress/Problems: The patient continues to offer very little in group. She did express that she was pleased with the fact that she had a visit with her children today. Her goal for tomorrow is to work on getting discharged.   Hazle CocaGOODMAN, Ileanna Gemmill S 06/29/2014, 9:23 PM

## 2014-06-29 NOTE — Progress Notes (Signed)
Nutrition Brief Note  Patient identified on the Malnutrition Screening Tool (MST) Report  Wt Readings from Last 15 Encounters:  06/20/14 318 lb (144.244 kg)  06/18/14 320 lb 12.3 oz (145.5 kg)  09/07/10 307 lb 2.1 oz (139.314 kg)  08/09/10 297 lb (134.718 kg)    Body mass index is 51.35 kg/(m^2). Patient meets criteria for morbid obesity based on current BMI.   Current diet order is 2 g Na, patient is consuming approximately 100% of meals at this time. Labs and medications reviewed.   Pt reports that it must have been a mistake in the computer that she has recently lost weight. Pt says that she has been eating normally.  No nutrition interventions warranted at this time. If nutrition issues arise, please consult RD.   Ebbie LatusHaley Hawkins RD, LDN

## 2014-06-29 NOTE — Progress Notes (Signed)
D Pt. Denies SI and HI, does report chronic back pain .  A Writer offered support and encouragement, adm. Ultram for the pain. And discussed discharge plans with pt.  R Pt. Remains safe on the unit,  Pt. States she is ready for discharge tomorrow and is anxious to be back with her family.  Pt. Reports she has supportive family that she can go to.

## 2014-06-29 NOTE — [facility] Group Notes (Signed)
[facility] Group Notes:  (Nursing/MHT/Case Management/Adjunct)  Date:  06/29/2014  Time:  11:07 AM  Type of Therapy:  Psychoeducational Skills  Participation Level:  Active  Participation Quality:  Appropriate  Affect:  Appropriate  Cognitive:  Appropriate  Insight:  Appropriate  Engagement in Group:  Engaged  Modes of Intervention:  Discussion  Summary of Progress/Problems: Pt did attend self inventory group, pt reported that she was negative SI/HI, no AH/VH noted. Pt rated her depression as a 2, and her helplessness/hopelessness as a 2.     Pt reported no issues or concerns.    Jacquelyne BalintForrest, Eleasha Cataldo Shanta 06/29/2014, 11:07 AM

## 2014-06-29 NOTE — Progress Notes (Signed)
Patient ID: Alyssa [name], female   DOB: [birth year], 38 y.o.   MRN: [id number] [facility] MD Progress Note  06/29/2014 12:50 PM Sharma Truman Hayward  MRN:  [id number] Subjective:  Patient states she is doing fine. "My cousin is 95% paralyzed and my grandma doesn't think he is going to make. My kids are suppose to come today for dinner. I am really excited I haven't seen them in 13 days."  [facility] also received a surprise visit from her sister during lunch time, which made her feel good. She reports compliance with medication at this time. She is interested in joining [facility] [place], and she would like to attend the 9 week anger management group at [facility]. Currently rates her depression 3/10, anxiety 5/10, and hopelessness 2/10. She notes that she has had a decrease in the amount of Tramadol and Klonopin since being here.   Objective :  Patient presents depressed, but smiling at times. She appears to be more stable and willing to go through with the rest of her treatment. She notes having a lot of support at home. She has increased the amount of groups she went to today. She is encouraged to continue to use coping skills and therapeutic skills to help reduce anxiety and depression as well decrease medicine use. Denies side effects from medications. No disruptive behaviors on unit. She did not attend any groups yesterday.     Diagnosis:  Bipolar, Depressed, Major Depression, Recurrent severe   Total Time spent with patient: 20 minutes  ADL's: fair   Sleep:Fair  Appetite:  Good  Suicidal Ideation:  At this time denies any suicidal ideations  Homicidal Ideation: At this time  denies AEB (as evidenced by):  Psychiatric Specialty Exam: Physical Exam   Review of Systems  Musculoskeletal: Positive for back pain.  Psychiatric/Behavioral: Positive for depression. The patient is nervous/anxious.     Blood pressure 129/78, pulse 66, temperature 98 F (36.7 C), temperature source Oral, resp. rate 18,  height 5\' 6"  (1.676 m), weight 144.244 kg (318 lb), last menstrual period 06/12/2014, SpO2 98.00%.Body mass index is 51.35 kg/(m^2).  General Appearance: improved grooming   Eye Contact::  Good  Speech:  Clear and Coherent  Volume:  Normal  Mood:  Depressed, but smiling at times.   Affect: Congruent and appropriate  Thought Process:  Goal Directed  Orientation:  Full (Time, Place, and Person)  Thought Content: Currently denies auditory hallucinations.   Suicidal Thoughts:  No- denies any thoughts of hurting herself at this time  Homicidal Thoughts:  No  Memory:  NA   Judgement:  Fair   Insight: Fair   Psychomotor Activity: decreased   Concentration:  Fair  Recall:  [employer] of Knowledge:Fair  Language: good  Akathisia:  Yes  Handed:  Right  AIMS (if indicated):     Assets:  Communication Skills Desire for Improvement  Sleep:  Number of Hours: 6   Musculoskeletal: Strength & Muscle Tone: within normal limits Gait & Station: normal Patient leans: N/A  Current Medications: Current Facility-Administered Medications  Medication Dose Route Frequency Provider Last Rate Last Dose  . acetaminophen (TYLENOL) tablet 650 mg  650 mg Oral Q6H PRN Kristeen Mans, NP   650 mg at 06/27/14 0439  . alum & mag hydroxide-simeth (MAALOX/MYLANTA) 200-200-20 MG/5ML suspension 30 mL  30 mL Oral Q4H PRN Kristeen Mans, NP      . ARIPiprazole (ABILIFY) tablet 5 mg  5 mg Oral Daily Nehemiah Massed, MD  5 mg at 06/29/14 0950  . cholecalciferol (VITAMIN D) tablet 5,000 Units  5,000 Units Oral Daily Kristeen MansFran E Hobson, NP   5,000 Units at 06/29/14 0951  . clonazePAM (KLONOPIN) tablet 0.5 mg  0.5 mg Oral TID PRN Oletta DarterSalina Agarwal, MD   0.5 mg at 06/29/14 1208  . gabapentin (NEURONTIN) capsule 600 mg  600 mg Oral TID Nehemiah MassedFernando Cobos, MD   600 mg at 06/29/14 1207  . hydrALAZINE (APRESOLINE) tablet 10 mg  10 mg Oral 3 times per day Kristeen MansFran E Hobson, NP   10 mg at 06/29/14 0626  . lamoTRIgine (LAMICTAL) tablet 25 mg  25  mg Oral Daily Nehemiah MassedFernando Cobos, MD   25 mg at 06/29/14 0950  . lithium carbonate capsule 300 mg  300 mg Oral TID WC Nehemiah MassedFernando Cobos, MD   300 mg at 06/29/14 1207  . magnesium hydroxide (MILK OF MAGNESIA) suspension 30 mL  30 mL Oral Daily PRN Kristeen MansFran E Hobson, NP      . mometasone-formoterol (DULERA) 100-5 MCG/ACT inhaler 2 puff  2 puff Inhalation BID Kristeen MansFran E Hobson, NP   2 puff at 06/29/14 0951  . nicotine (NICODERM CQ - dosed in mg/24 hours) patch 21 mg  21 mg Transdermal Daily Kristeen MansFran E Hobson, NP   21 mg at 06/29/14 0950  . pantoprazole (PROTONIX) EC tablet 80 mg  80 mg Oral Q1200 Kristeen MansFran E Hobson, NP   80 mg at 06/29/14 1207  . potassium chloride (K-DUR,KLOR-CON) CR tablet 10 mEq  10 mEq Oral Daily Kristeen MansFran E Hobson, NP   10 mEq at 06/29/14 0950  . sertraline (ZOLOFT) tablet 100 mg  100 mg Oral Daily Nehemiah MassedFernando Cobos, MD   100 mg at 06/29/14 0951  . traMADol (ULTRAM) tablet 100 mg  100 mg Oral Q6H PRN Oletta DarterSalina Agarwal, MD   100 mg at 06/29/14 [id number]    Lab Results:  No results found for this or any previous visit (from the past 48 hour(s)).  Physical Findings: AIMS: Facial and Oral Movements Muscles of Facial Expression: None, normal Lips and Perioral Area: None, normal Jaw: None, normal Tongue: None, normal,Extremity Movements Upper (arms, wrists, hands, fingers): None, normal Lower (legs, knees, ankles, toes): None, normal, Trunk Movements Neck, shoulders, hips: None, normal, Overall Severity Severity of abnormal movements (highest score from questions above): None, normal Incapacitation due to abnormal movements: None, normal Patient's awareness of abnormal movements (rate only patient's report): No Awareness, Dental Status Current problems with teeth and/or dentures?: No Does patient usually wear dentures?: No  CIWA:  CIWA-Ar Total: 3 COWS:  COWS Total Score: 4  Assessment: Today remains sad, depressed, and tearful. No SI. She attributes her depression today to a call from her boyfriend, who had  left her a few weeks ago. She is tolerating Lamictal and Lithium well. She has been on lithium before and she remembers it helped her stabilize.  As Li level low will titrate dose.  Will add Abilify , which she has also been on before- does not remember having had side effects- to address ongoing mood symptoms. We reviewed side effects   Treatment Plan Summary: Daily contact with patient to assess and evaluate symptoms and progress in treatment Medication management  Plan: Continue inpatient treatment, support, milieu Zoloft  100 mgrs po qD for depression Gabapentin   600 mg po TID  Clonazepam 0.5 mg po to TID PRN Anxiety Increase LiC03   300 mgrs TID  Lamictal 25 mgrs BID  Start Abilify 5 mgrs QHS.  Medical Decision Making Problem Points:  Established problem, stable/improving (1) and Review of psycho-social stressors (1) Data Points:  Review or order clinical lab tests (1) Review of medication regiment & side effects (2) Review of new medications or change in dosage (2)  I certify that inpatient services furnished can reasonably be expected to improve the patient's condition.   Malachy Chamber S FNP-BC 06/29/2014, 12:50 PM

## 2014-06-29 NOTE — [facility] Group Notes (Signed)
[facility] Group Notes: (Clinical Social Work)   06/29/2014      Type of Therapy:  Group Therapy   Participation Level:  Did Not Attend    Ambrose MantleMareida Grossman-Orr, LCSW 06/29/2014, 3:58 PM

## 2014-06-30 MED ORDER — GABAPENTIN 300 MG PO CAPS: 600.0000 mg | ORAL_CAPSULE | Freq: Three times a day (TID) | ORAL | Status: DC

## 2014-06-30 MED ORDER — LAMOTRIGINE 25 MG PO TABS: 25.0000 mg | ORAL_TABLET | Freq: Every day | ORAL | Status: DC

## 2014-06-30 MED ORDER — ARIPIPRAZOLE 5 MG PO TABS: 5.0000 mg | ORAL_TABLET | Freq: Every day | ORAL | Status: DC

## 2014-06-30 MED ORDER — LITHIUM CARBONATE 300 MG PO CAPS: 300.0000 mg | ORAL_CAPSULE | Freq: Three times a day (TID) | ORAL | Status: DC

## 2014-06-30 MED ORDER — CLONAZEPAM 0.5 MG PO TABS: 0.5000 mg | ORAL_TABLET | Freq: Two times a day (BID) | ORAL | Status: DC

## 2014-06-30 MED ORDER — POTASSIUM CHLORIDE ER 10 MEQ PO TBCR: 10.0000 meq | EXTENDED_RELEASE_TABLET | Freq: Every morning | ORAL | Status: DC

## 2014-06-30 MED ORDER — ESOMEPRAZOLE MAGNESIUM 40 MG PO CPDR: 40.0000 mg | DELAYED_RELEASE_CAPSULE | Freq: Every morning | ORAL | Status: DC

## 2014-06-30 MED ORDER — VITAMIN D-3 125 MCG (5000 UT) PO TABS: 1.0000 | ORAL_TABLET | Freq: Every morning | ORAL | Status: DC

## 2014-06-30 MED ORDER — ALBUTEROL SULFATE (2.5 MG/3ML) 0.083% IN NEBU: 2.5000 mg | INHALATION_SOLUTION | RESPIRATORY_TRACT | Status: DC | PRN

## 2014-06-30 MED ORDER — SERTRALINE HCL 100 MG PO TABS: 100.0000 mg | ORAL_TABLET | Freq: Every day | ORAL | Status: DC

## 2014-06-30 MED ORDER — CLONAZEPAM 0.5 MG PO TABS: 0.5000 mg | ORAL_TABLET | Freq: Three times a day (TID) | ORAL | Status: DC | PRN

## 2014-06-30 MED ORDER — FLUTICASONE-SALMETEROL 250-50 MCG/DOSE IN AEPB: 2.0000 | INHALATION_SPRAY | Freq: Two times a day (BID) | RESPIRATORY_TRACT | Status: DC | PRN

## 2014-06-30 NOTE — [facility] Suicide Risk Assessment (Signed)
Suicide Risk Assessment  Discharge Assessment     Demographic Factors:  Caucasian  Total Time spent with patient: 30 minutes  Psychiatric Specialty Exam:     Blood pressure 110/80, pulse 93, temperature 98.2 F (36.8 C), temperature source Oral, resp. rate 18, height 5\' 6"  (1.676 m), weight 144.244 kg (318 lb), last menstrual period 06/12/2014, SpO2 98.00%.Body mass index is 51.35 kg/(m^2).  General Appearance: Fairly Groomed  Patent attorneyye Contact::  Fair  Speech:  Clear and Coherent  Volume:  Normal  Mood:  sad  Affect:  Appropriate  Thought Process:  Coherent and Goal Directed  Orientation:  Full (Time, Place, and Person)  Thought Content:  events worries concerns  Suicidal Thoughts:  No  Homicidal Thoughts:  No  Memory:  Immediate;   Fair Recent;   Fair Remote;   Fair  Judgement:  Fair  Insight:  Present  Psychomotor Activity:  Normal  Concentration:  Fair  Recall:  [employer]  Fund of Knowledge:NA  Language: Fair  Akathisia:  No  Handed:    AIMS (if indicated):     Assets:  Desire for Improvement  Sleep:  Number of Hours: 6    Musculoskeletal: Strength & Muscle Tone: within normal limits Gait & Station: normal Patient leans: N/A   Mental Status Per Nursing Assessment::   On Admission:     Current Mental Status by Physician: In full contact with reality. There are no active SI plans or intent. States she is ready to be D/C. Admits there are stressful things that she will have to continue to work on but feels she is ready to do it. Feels she is better able to do it now. States she is committed to continue to follow up outpatient basis   Loss Factors: N/A   Historical Factors: NA  Risk Reduction Factors:   Sense of responsibility to family, Living with another person, especially a relative and Positive social support  Continued Clinical Symptoms:  Bipolar Disorder:   Depressive phase  Cognitive Features That Contribute To Risk:  Closed-mindedness Polarized  thinking Thought constriction (tunnel vision)    Suicide Risk:  Minimal: No identifiable suicidal ideation.  Patients presenting with no risk factors but with morbid ruminations; may be classified as minimal risk based on the severity of the depressive symptoms  Discharge Diagnoses:   AXIS I:  Bipolar Depressed AXIS II:  No diagnosis AXIS III:   Past Medical History  Diagnosis Date  . Asthma   . Anxiety   . Fibromyalgia   . Bipolar disorder   . HTN (hypertension)     ACEI cough   . HLD (hyperlipidemia)   . Osteoporosis   . Low back pain     2 ruptured discs in lower back   . Active smoker   . LVH (left ventricular hypertrophy)     echo (7/11) showed EF 70%, mild LVH, no sig valvular disease, moderate pericardial effussion. echo (9/11) showed no pericardial effusion, RF 65%, mild MR, normal RV size and systolic function.    AXIS IV:  other psychosocial or environmental problems AXIS V:  61-70 mild symptoms  Plan Of Care/Follow-up recommendations:  Activity:  as tolerated Diet:  regular Follow up outpatient basis Is patient on multiple antipsychotic therapies at discharge:  No   Has Patient had three or more failed trials of antipsychotic monotherapy by history:  No  Recommended Plan for Multiple Antipsychotic Therapies: NA    Alyssa [name] A 06/30/2014, 3:01 PM

## 2014-06-30 NOTE — Progress Notes (Signed)
[facility]  Will you be returning to the same living situation after discharge: Yes,  Home with sons At discharge, do you have transportation home?:Yes,  sister will provide transportation Do you have the ability to pay for your medications:Yes,  Pt provided with perscriptions and given 14-day supply of medication   Release of information consent forms completed and in the chart;  Patient's signature needed at discharge.  Patient to Follow up at: Follow-up Information   [facility] ([facility] Monday, July 01, 2014 or any weekday between 8AM -12 Noon or 1PM -3PM for medication managment/counseling)    Contact information:   [address] [place], [place]   [id number] [phone]      Patient denies SI/HI:   Yes,  Pt denies SI/HI    Safety Planning and Suicide Prevention discussed:  Yes,  discussed with Sherren Kernsabitha Brooks, sister  Elaina HoopsCarter, Jovany Disano M 06/30/2014, 3:55 PM

## 2014-06-30 NOTE — [facility] Group Notes (Signed)
[facility]  06/30/2014 8:45 AM  Participation Quality: Alert, Appropriate and Oriented  Mood/Affect: Flat  Depression Rating: 1  Anxiety Rating: 1  Thoughts of Suicide: Pt denies SI/HI  Will you contract for safety? Yes  Current AVH: Pt denies  Plan for Discharge/Comments: Pt attended discharge planning group and actively participated in group. CSW provided pt with today's workbook. Pt reports that she will be discharging to her home with her two sons and [facility] [place]  Transportation Means: Pt reports access to transportation  Supports: No supports mentioned at this time  Chad CordialLauren Carter, LCSWA 06/30/2014 10:10 AM
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
## 2014-07-04 NOTE — Progress Notes (Signed)
Patient Discharge Instructions:  After Visit Summary (AVS):   Faxed to:  07/04/14 Discharge Summary Note:   Faxed to:  07/04/14 Psychiatric Admission Assessment Note:   Faxed to:  07/04/14 Suicide Risk Assessment - Discharge Assessment:   Faxed to:  06/2414 Faxed/Sent to the Next Level Care provider:  07/04/14 [facility] @ [phone]  Jerelene ReddenSheena E [place], 07/04/2014, 2:28 PM

## 2015-02-15 ENCOUNTER — Encounter (HOSPITAL_COMMUNITY): Payer: Self-pay | Admitting: *Deleted

## 2015-02-15 ENCOUNTER — Emergency Department (HOSPITAL_COMMUNITY)
Admission: EM | Admit: 2015-02-15 | Discharge: 2015-02-15 | Discharge status: Absent without leave | Payer: Medicaid Other

## 2015-02-15 ENCOUNTER — Emergency Department (HOSPITAL_COMMUNITY)
Admission: EM | Admit: 2015-02-15 | Discharge: 2015-02-16 | Disposition: A | Discharge status: Home / Self Care | Payer: Medicaid Other | Attending: Emergency Medicine | Admitting: Emergency Medicine

## 2015-02-15 DIAGNOSIS — Z79899 Other long term (current) drug therapy: Secondary | ICD-10-CM | POA: Insufficient documentation

## 2015-02-15 DIAGNOSIS — Z720 Tobacco use: Secondary | ICD-10-CM | POA: Insufficient documentation

## 2015-02-15 DIAGNOSIS — F319 Bipolar disorder, unspecified: Secondary | ICD-10-CM | POA: Diagnosis not present

## 2015-02-15 DIAGNOSIS — F419 Anxiety disorder, unspecified: Secondary | ICD-10-CM | POA: Insufficient documentation

## 2015-02-15 DIAGNOSIS — M797 Fibromyalgia: Secondary | ICD-10-CM | POA: Diagnosis not present

## 2015-02-15 DIAGNOSIS — E785 Hyperlipidemia, unspecified: Secondary | ICD-10-CM | POA: Insufficient documentation

## 2015-02-15 DIAGNOSIS — G8929 Other chronic pain: Secondary | ICD-10-CM | POA: Insufficient documentation

## 2015-02-15 DIAGNOSIS — M810 Age-related osteoporosis without current pathological fracture: Secondary | ICD-10-CM | POA: Diagnosis not present

## 2015-02-15 DIAGNOSIS — S4992XA Unspecified injury of left shoulder and upper arm, initial encounter: Secondary | ICD-10-CM | POA: Diagnosis not present

## 2015-02-15 DIAGNOSIS — Z8669 Personal history of other diseases of the nervous system and sense organs: Secondary | ICD-10-CM | POA: Insufficient documentation

## 2015-02-15 DIAGNOSIS — J449 Chronic obstructive pulmonary disease, unspecified: Secondary | ICD-10-CM | POA: Insufficient documentation

## 2015-02-15 DIAGNOSIS — I10 Essential (primary) hypertension: Secondary | ICD-10-CM | POA: Diagnosis not present

## 2015-02-15 DIAGNOSIS — Y9389 Activity, other specified: Secondary | ICD-10-CM | POA: Diagnosis not present

## 2015-02-15 DIAGNOSIS — M25812 Other specified joint disorders, left shoulder: Secondary | ICD-10-CM

## 2015-02-15 DIAGNOSIS — Y92410 Unspecified street and highway as the place of occurrence of the external cause: Secondary | ICD-10-CM | POA: Insufficient documentation

## 2015-02-15 DIAGNOSIS — Y998 Other external cause status: Secondary | ICD-10-CM | POA: Insufficient documentation

## 2015-02-15 HISTORY — DX: Chronic obstructive pulmonary disease, unspecified: J44.9

## 2015-02-15 HISTORY — DX: Sleep apnea, unspecified: G47.30
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
## 2015-02-16 ENCOUNTER — Emergency Department (HOSPITAL_COMMUNITY): Payer: Medicaid Other

## 2015-02-16 MED ORDER — HYDROCODONE-ACETAMINOPHEN 5-325 MG PO TABS: 1.0000 | ORAL_TABLET | Freq: Four times a day (QID) | ORAL | Status: DC | PRN
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
## 2015-02-16 NOTE — Discharge Instructions (Signed)
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
## 2015-07-08 ENCOUNTER — Encounter (HOSPITAL_COMMUNITY): Payer: Self-pay | Admitting: Emergency Medicine

## 2015-07-08 ENCOUNTER — Emergency Department (HOSPITAL_COMMUNITY)
Admission: EM | Admit: 2015-07-08 | Discharge: 2015-07-08 | Disposition: A | Discharge status: Home / Self Care | Payer: Medicaid Other | Attending: Emergency Medicine | Admitting: Emergency Medicine

## 2015-07-08 DIAGNOSIS — S199XXA Unspecified injury of neck, initial encounter: Secondary | ICD-10-CM | POA: Diagnosis not present

## 2015-07-08 DIAGNOSIS — M545 Low back pain: Secondary | ICD-10-CM

## 2015-07-08 DIAGNOSIS — F419 Anxiety disorder, unspecified: Secondary | ICD-10-CM | POA: Diagnosis not present

## 2015-07-08 DIAGNOSIS — S3992XA Unspecified injury of lower back, initial encounter: Secondary | ICD-10-CM | POA: Diagnosis not present

## 2015-07-08 DIAGNOSIS — M542 Cervicalgia: Secondary | ICD-10-CM

## 2015-07-08 DIAGNOSIS — Y9389 Activity, other specified: Secondary | ICD-10-CM | POA: Insufficient documentation

## 2015-07-08 DIAGNOSIS — Y92410 Unspecified street and highway as the place of occurrence of the external cause: Secondary | ICD-10-CM | POA: Diagnosis not present

## 2015-07-08 DIAGNOSIS — Y999 Unspecified external cause status: Secondary | ICD-10-CM | POA: Insufficient documentation

## 2015-07-08 HISTORY — DX: Dorsalgia, unspecified: M54.9

## 2015-07-08 HISTORY — DX: Essential (primary) hypertension: I10

## 2015-07-08 HISTORY — DX: Other chronic pain: G89.29

## 2015-07-08 HISTORY — DX: Bipolar disorder, unspecified: F31.9

## 2015-07-08 HISTORY — DX: Unspecified osteoarthritis, unspecified site: M19.90

## 2015-07-08 MED ORDER — ACETAMINOPHEN 325 MG PO TABS
650.0000 mg | ORAL_TABLET | Freq: Once | ORAL | Status: AC
  Administered 2015-07-08: 650 mg via ORAL
  Filled 2015-07-08: qty 2

## 2015-07-08 MED ORDER — DIAZEPAM 5 MG PO TABS
5.0000 mg | ORAL_TABLET | Freq: Once | ORAL | Status: AC
  Administered 2015-07-08: 5 mg via ORAL
  Filled 2015-07-08 (×2): qty 1

## 2015-07-08 MED ORDER — DIAZEPAM 5 MG PO TABS: 5.0000 mg | ORAL_TABLET | Freq: Two times a day (BID) | ORAL | Status: DC

## 2015-07-08 MED ORDER — NAPROXEN 250 MG PO TABS: 250.0000 mg | ORAL_TABLET | Freq: Two times a day (BID) | ORAL | Status: DC

## 2015-07-08 NOTE — Discharge Instructions (Signed)
Cervical Sprain A cervical sprain is when the tissues (ligaments) that hold the neck bones in place stretch or tear. HOME CARE   Put ice on the injured area.  Put ice in a plastic bag.  Place a towel between your skin and the bag.  Leave the ice on for 15-20 minutes, 3-4 times a day.  You may have been given a collar to wear. This collar keeps your neck from moving while you heal.  Do not take the collar off unless told by your doctor.  If you have long hair, keep it outside of the collar.  Ask your doctor before changing the position of your collar. You may need to change its position over time to make it more comfortable.  If you are allowed to take off the collar for cleaning or bathing, follow your doctor's instructions on how to do it safely.  Keep your collar clean by wiping it with mild soap and water. Dry it completely. If the collar has removable pads, remove them every 1-2 days to hand wash them with soap and water. Allow them to air dry. They should be dry before you wear them in the collar.  Do not drive while wearing the collar.  Only take medicine as told by your doctor.  Keep all doctor visits as told.  Keep all physical therapy visits as told.  Adjust your work station so that you have good posture while you work.  Avoid positions and activities that make your problems worse.  Warm up and stretch before being active. GET HELP IF:  Your pain is not controlled with medicine.  You cannot take less pain medicine over time as planned.  Your activity level does not improve as expected. GET HELP RIGHT AWAY IF:   You are bleeding.  Your stomach is upset.  You have an allergic reaction to your medicine.  You develop new problems that you cannot explain.  You lose feeling (become numb) or you cannot move any part of your body (paralysis).  You have tingling or weakness in any part of your body.  Your symptoms get worse. Symptoms include:  Pain,  soreness, stiffness, puffiness (swelling), or a burning feeling in your neck.  Pain when your neck is touched.  Shoulder or upper back pain.  Limited ability to move your neck.  Headache.  Dizziness.  Your hands or arms feel week, lose feeling, or tingle.  Muscle spasms.  Difficulty swallowing or chewing. MAKE SURE YOU:   Understand these instructions.  Will watch your condition.  Will get help right away if you are not doing well or get worse. Document Released: 05/16/2008 Document Revised: 07/31/2013 Document Reviewed: 06/05/2013 [facility] Patient Information 2015 [place], [place]. This information is not intended to replace advice given to you by your health care provider. Make sure you discuss any questions you have with your health care provider.  Back Exercises Back exercises help treat and prevent back injuries. The goal of back exercises is to increase the strength of your abdominal and back muscles and the flexibility of your back. These exercises should be started when you no longer have back pain. Back exercises include:  Pelvic Tilt. Lie on your back with your knees bent. Tilt your pelvis until the lower part of your back is against the floor. Hold this position 5 to 10 sec and repeat 5 to 10 times.  Knee to Chest. Pull first 1 knee up against your chest and hold for 20 to 30 seconds,  repeat this with the other knee, and then both knees. This may be done with the other leg straight or bent, whichever feels better.  Sit-Ups or Curl-Ups. Bend your knees 90 degrees. Start with tilting your pelvis, and do a partial, slow sit-up, lifting your trunk only 30 to 45 degrees off the floor. Take at least 2 to 3 seconds for each sit-up. Do not do sit-ups with your knees out straight. If partial sit-ups are difficult, simply do the above but with only tightening your abdominal muscles and holding it as directed.  Hip-Lift. Lie on your back with your knees flexed 90 degrees. Push down  with your feet and shoulders as you raise your hips a couple inches off the floor; hold for 10 seconds, repeat 5 to 10 times.  Back arches. Lie on your stomach, propping yourself up on bent elbows. Slowly press on your hands, causing an arch in your low back. Repeat 3 to 5 times. Any initial stiffness and discomfort should lessen with repetition over time.  Shoulder-Lifts. Lie face down with arms beside your body. Keep hips and torso pressed to floor as you slowly lift your head and shoulders off the floor. Do not overdo your exercises, especially in the beginning. Exercises may cause you some mild back discomfort which lasts for a few minutes; however, if the pain is more severe, or lasts for more than 15 minutes, do not continue exercises until you see your caregiver. Improvement with exercise therapy for back problems is slow.  See your caregivers for assistance with developing a proper back exercise program. Document Released: 01/05/2005 Document Revised: 02/20/2012 Document Reviewed: 09/29/2011 [facility] Patient Information 2015 [place], [place]. This information is not intended to replace advice given to you by your health care provider. Make sure you discuss any questions you have with your health care provider. Back Pain, Adult Back pain is very common. The pain often gets better over time. The cause of back pain is usually not dangerous. Most people can learn to manage their back pain on their own.  HOME CARE   Stay active. Start with short walks on flat ground if you can. Try to walk farther each day.  Do not sit, drive, or stand in one place for more than 30 minutes. Do not stay in bed.  Do not avoid exercise or work. Activity can help your back heal faster.  Be careful when you bend or lift an object. Bend at your knees, keep the object close to you, and do not twist.  Sleep on a firm mattress. Lie on your side, and bend your knees. If you lie on your back, put a pillow under your  knees.  Only take medicines as told by your doctor.  Put ice on the injured area.  Put ice in a plastic bag.  Place a towel between your skin and the bag.  Leave the ice on for 15-20 minutes, 03-04 times a day for the first 2 to 3 days. After that, you can switch between ice and heat packs.  Ask your doctor about back exercises or massage.  Avoid feeling anxious or stressed. Find good ways to deal with stress, such as exercise. GET HELP RIGHT AWAY IF:   Your pain does not go away with rest or medicine.  Your pain does not go away in 1 week.  You have new problems.  You do not feel well.  The pain spreads into your legs.  You cannot control when you poop (bowel  movement) or pee (urinate).  Your arms or legs feel weak or lose feeling (numbness).  You feel sick to your stomach (nauseous) or throw up (vomit).  You have belly (abdominal) pain.  You feel like you may pass out (faint). MAKE SURE YOU:   Understand these instructions.  Will watch your condition.  Will get help right away if you are not doing well or get worse. Document Released: 05/16/2008 Document Revised: 02/20/2012 Document Reviewed: 04/01/2014 [facility] Patient Information 2015 [place], [place]. This information is not intended to replace advice given to you by your health care provider. Make sure you discuss any questions you have with your health care provider. Motor Vehicle Collision It is common to have multiple bruises and sore muscles after a motor vehicle collision (MVC). These tend to feel worse for the first 24 hours. You may have the most stiffness and soreness over the first several hours. You may also feel worse when you wake up the first morning after your collision. After this point, you will usually begin to improve with each day. The speed of improvement often depends on the severity of the collision, the number of injuries, and the location and nature of these injuries. HOME CARE  INSTRUCTIONS  Put ice on the injured area.  Put ice in a plastic bag.  Place a towel between your skin and the bag.  Leave the ice on for 15-20 minutes, 3-4 times a day, or as directed by your health care provider.  Drink enough fluids to keep your urine clear or pale yellow. Do not drink alcohol.  Take a warm shower or bath once or twice a day. This will increase blood flow to sore muscles.  You may return to activities as directed by your caregiver. Be careful when lifting, as this may aggravate neck or back pain.  Only take over-the-counter or prescription medicines for pain, discomfort, or fever as directed by your caregiver. Do not use aspirin. This may increase bruising and bleeding. SEEK IMMEDIATE MEDICAL CARE IF:  You have numbness, tingling, or weakness in the arms or legs.  You develop severe headaches not relieved with medicine.  You have severe neck pain, especially tenderness in the middle of the back of your neck.  You have changes in bowel or bladder control.  There is increasing pain in any area of the body.  You have shortness of breath, light-headedness, dizziness, or fainting.  You have chest pain.  You feel sick to your stomach (nauseous), throw up (vomit), or sweat.  You have increasing abdominal discomfort.  There is blood in your urine, stool, or vomit.  You have pain in your shoulder (shoulder strap areas).  You feel your symptoms are getting worse. MAKE SURE YOU:  Understand these instructions.  Will watch your condition.  Will get help right away if you are not doing well or get worse. Document Released: 11/28/2005 Document Revised: 04/14/2014 Document Reviewed: 04/27/2011 [facility] Patient Information 2015 [place], [place]. This information is not intended to replace advice given to you by your health care provider. Make sure you discuss any questions you have with your health care provider.
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
## 2015-07-29 ENCOUNTER — Encounter (HOSPITAL_COMMUNITY): Payer: Self-pay | Admitting: *Deleted

## 2015-11-01 ENCOUNTER — Emergency Department (HOSPITAL_COMMUNITY)
Admission: EM | Admit: 2015-11-01 | Discharge: 2015-11-01 | Disposition: A | Discharge status: Home / Self Care | Payer: Medicaid Other | Attending: Emergency Medicine | Admitting: Emergency Medicine

## 2015-11-01 ENCOUNTER — Encounter (HOSPITAL_COMMUNITY): Payer: Self-pay | Admitting: Emergency Medicine

## 2015-11-01 ENCOUNTER — Emergency Department (HOSPITAL_COMMUNITY): Payer: Medicaid Other

## 2015-11-01 DIAGNOSIS — F17200 Nicotine dependence, unspecified, uncomplicated: Secondary | ICD-10-CM | POA: Diagnosis not present

## 2015-11-01 DIAGNOSIS — L089 Local infection of the skin and subcutaneous tissue, unspecified: Secondary | ICD-10-CM | POA: Diagnosis present

## 2015-11-01 DIAGNOSIS — Z8639 Personal history of other endocrine, nutritional and metabolic disease: Secondary | ICD-10-CM | POA: Insufficient documentation

## 2015-11-01 DIAGNOSIS — M810 Age-related osteoporosis without current pathological fracture: Secondary | ICD-10-CM | POA: Diagnosis not present

## 2015-11-01 DIAGNOSIS — M25512 Pain in left shoulder: Secondary | ICD-10-CM | POA: Diagnosis not present

## 2015-11-01 DIAGNOSIS — I10 Essential (primary) hypertension: Secondary | ICD-10-CM | POA: Diagnosis not present

## 2015-11-01 DIAGNOSIS — Z791 Long term (current) use of non-steroidal anti-inflammatories (NSAID): Secondary | ICD-10-CM | POA: Diagnosis not present

## 2015-11-01 DIAGNOSIS — Z7951 Long term (current) use of inhaled steroids: Secondary | ICD-10-CM | POA: Insufficient documentation

## 2015-11-01 DIAGNOSIS — L03115 Cellulitis of right lower limb: Secondary | ICD-10-CM | POA: Diagnosis not present

## 2015-11-01 DIAGNOSIS — Z79899 Other long term (current) drug therapy: Secondary | ICD-10-CM | POA: Insufficient documentation

## 2015-11-01 DIAGNOSIS — J449 Chronic obstructive pulmonary disease, unspecified: Secondary | ICD-10-CM | POA: Insufficient documentation

## 2015-11-01 DIAGNOSIS — G8929 Other chronic pain: Secondary | ICD-10-CM | POA: Diagnosis not present

## 2015-11-01 DIAGNOSIS — L0291 Cutaneous abscess, unspecified: Secondary | ICD-10-CM

## 2015-11-01 DIAGNOSIS — L02416 Cutaneous abscess of left lower limb: Secondary | ICD-10-CM | POA: Insufficient documentation

## 2015-11-01 DIAGNOSIS — Z8614 Personal history of Methicillin resistant Staphylococcus aureus infection: Secondary | ICD-10-CM | POA: Diagnosis not present

## 2015-11-01 DIAGNOSIS — F319 Bipolar disorder, unspecified: Secondary | ICD-10-CM | POA: Diagnosis not present

## 2015-11-01 DIAGNOSIS — L0390 Cellulitis, unspecified: Secondary | ICD-10-CM

## 2015-11-01 DIAGNOSIS — F419 Anxiety disorder, unspecified: Secondary | ICD-10-CM | POA: Diagnosis not present

## 2015-11-01 MED ORDER — CEPHALEXIN 500 MG PO CAPS: 500.0000 mg | ORAL_CAPSULE | Freq: Four times a day (QID) | ORAL | Status: DC

## 2015-11-01 MED ORDER — SULFAMETHOXAZOLE-TRIMETHOPRIM 800-160 MG PO TABS: 2.0000 | ORAL_TABLET | Freq: Two times a day (BID) | ORAL | Status: AC

## 2015-11-01 MED ORDER — SODIUM BICARBONATE 4 % IV SOLN
5.0000 mL | Freq: Once | INTRAVENOUS | Status: AC
  Administered 2015-11-01: 5 mL via SUBCUTANEOUS
  Filled 2015-11-01: qty 5

## 2015-11-01 MED ORDER — LIDOCAINE HCL (PF) 1 % IJ SOLN
5.0000 mL | Freq: Once | INTRAMUSCULAR | Status: AC
  Administered 2015-11-01: 5 mL
  Filled 2015-11-01: qty 5

## 2015-11-01 NOTE — ED Provider Notes (Signed)
INCISION AND DRAINAGE Performed by: Anselm PancoastShawn C Janah Mcculloh Consent: Verbal consent obtained. Risks and benefits: risks, benefits and alternatives were discussed Type: abscess  Body area: Upper left thigh  Anesthesia: local infiltration  Incision was made with a scalpel.  Local anesthetic: lidocaine 1%  Anesthetic total: 3.5 ml  Complexity: complex Blunt dissection to break up loculations  Drainage: purulent-bloody  Drainage amount: 3 mL  Packing material: None  Patient tolerance: Patient tolerated the procedure well with no immediate complications.   Anselm PancoastShawn C Cataleyah Colborn, PA-C 9:51 PM  Anselm PancoastShawn C Juanjesus Pepperman, PA-C 11/01/15 2151  Benjiman CoreNathan Pickering, MD 11/01/15 2328

## 2015-11-01 NOTE — ED Notes (Addendum)
Pt c/o left shoulder pain "clicking" and skin abcess to left upper thigh and right thigh. She reports she has has to have I and D's before. Denies injury to shoulder.

## 2015-11-01 NOTE — Discharge Instructions (Signed)
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
## 2016-04-01 ENCOUNTER — Encounter (HOSPITAL_COMMUNITY): Payer: Self-pay | Admitting: Oncology

## 2016-04-01 ENCOUNTER — Emergency Department (HOSPITAL_COMMUNITY): Payer: Medicaid Other

## 2016-04-01 ENCOUNTER — Emergency Department (HOSPITAL_COMMUNITY)
Admission: EM | Admit: 2016-04-01 | Discharge: 2016-04-01 | Disposition: A | Discharge status: Home / Self Care | Payer: Medicaid Other | Attending: Emergency Medicine | Admitting: Emergency Medicine

## 2016-04-01 DIAGNOSIS — I10 Essential (primary) hypertension: Secondary | ICD-10-CM | POA: Diagnosis not present

## 2016-04-01 DIAGNOSIS — J449 Chronic obstructive pulmonary disease, unspecified: Secondary | ICD-10-CM | POA: Diagnosis not present

## 2016-04-01 DIAGNOSIS — F319 Bipolar disorder, unspecified: Secondary | ICD-10-CM | POA: Diagnosis not present

## 2016-04-01 DIAGNOSIS — R0602 Shortness of breath: Secondary | ICD-10-CM | POA: Diagnosis present

## 2016-04-01 DIAGNOSIS — Z7951 Long term (current) use of inhaled steroids: Secondary | ICD-10-CM | POA: Diagnosis not present

## 2016-04-01 DIAGNOSIS — Z79899 Other long term (current) drug therapy: Secondary | ICD-10-CM | POA: Diagnosis not present

## 2016-04-01 DIAGNOSIS — J45909 Unspecified asthma, uncomplicated: Secondary | ICD-10-CM | POA: Insufficient documentation

## 2016-04-01 DIAGNOSIS — Z791 Long term (current) use of non-steroidal anti-inflammatories (NSAID): Secondary | ICD-10-CM | POA: Diagnosis not present

## 2016-04-01 DIAGNOSIS — J441 Chronic obstructive pulmonary disease with (acute) exacerbation: Secondary | ICD-10-CM | POA: Diagnosis not present

## 2016-04-01 LAB — BASIC METABOLIC PANEL
Anion gap: 10 (ref 5–15)
BUN: 8 mg/dL (ref 6–20)
CHLORIDE: 105 mmol/L (ref 101–111)
CO2: 27 mmol/L (ref 22–32)
Calcium: 9.3 mg/dL (ref 8.9–10.3)
Creatinine, Ser: 1 mg/dL (ref 0.44–1.00)
GFR calc Af Amer: >60 mL/min (ref >60)
GFR calc non Af Amer: >60 mL/min (ref >60)
Glucose, Bld: 110 mg/dL — ABNORMAL HIGH (ref 65–99)
POTASSIUM: 3.4 mmol/L — AB (ref 3.5–5.1)
SODIUM: 142 mmol/L (ref 135–145)

## 2016-04-01 LAB — CBC
HEMATOCRIT: 38.3 % (ref 36.0–46.0)
HEMOGLOBIN: 11.5 g/dL — AB (ref 12.0–15.0)
MCH: 23.1 pg — ABNORMAL LOW (ref 26.0–34.0)
MCHC: 30 g/dL (ref 30.0–36.0)
MCV: 77.1 fL — AB (ref 78.0–100.0)
Platelets: 335 10*3/uL (ref 150–400)
RBC: 4.97 MIL/uL (ref 3.87–5.11)
RDW: 17 % — AB (ref 11.5–15.5)
WBC: 9.7 10*3/uL (ref 4.0–10.5)

## 2016-04-01 MED ORDER — PREDNISONE 20 MG PO TABS
60.0000 mg | ORAL_TABLET | Freq: Once | ORAL | Status: AC
  Administered 2016-04-01: 60 mg via ORAL
  Filled 2016-04-01: qty 3

## 2016-04-01 MED ORDER — ALBUTEROL SULFATE (2.5 MG/3ML) 0.083% IN NEBU
5.0000 mg | INHALATION_SOLUTION | Freq: Once | RESPIRATORY_TRACT | Status: DC
Start: 2016-04-01 — End: 2016-04-01

## 2016-04-01 MED ORDER — ALBUTEROL (5 MG/ML) CONTINUOUS INHALATION SOLN
10.0000 mg/h | INHALATION_SOLUTION | RESPIRATORY_TRACT | Status: DC
  Administered 2016-04-01: 10 mg/h via RESPIRATORY_TRACT
  Filled 2016-04-01: qty 20

## 2016-04-01 MED ORDER — PREDNISONE 20 MG PO TABS: 40.0000 mg | ORAL_TABLET | Freq: Every day | ORAL | Status: DC

## 2016-04-01 MED ORDER — IPRATROPIUM-ALBUTEROL 0.5-2.5 (3) MG/3ML IN SOLN
3.0000 mL | Freq: Once | RESPIRATORY_TRACT | Status: AC
  Administered 2016-04-01: 3 mL via RESPIRATORY_TRACT
  Filled 2016-04-01: qty 3

## 2016-04-01 NOTE — ED Notes (Signed)
Patient transported to X-ray 

## 2016-04-01 NOTE — ED Notes (Signed)
Patient d/c'd self care.  F/U and medications reviewed.  Patient verbalized understanding. 

## 2016-04-01 NOTE — Progress Notes (Addendum)
[facility]  EDCM spoke to patient at bedside.  Patient reports when she was evicted from her apartment, all of her oxygen supplies were left there, oxygen tanks, concentrator, nebulizer machine and Cpap machine.  Patient reports she has not had oxygen for 3 months. She reports using her sister's nebulizer machine. Patient reports she only wears her oxygen at night.  Patient [facility]  [facility]  Awaiting return call.  Patient reports [facility], [phone].  [facility] reports patient is not in their system for oxygen.  Discussed with EDPA.

## 2016-04-01 NOTE — ED Provider Notes (Signed)
CSN: [id number]     Arrival date & time 04/01/16  1920 History   First MD Initiated Contact with Patient 04/01/16 1954     Chief Complaint  Patient presents with  . Shortness of Breath    HPI   Alyssa [name] is an 40 y.o. female with history of COPD, HTN, HLD, asthma, bipolar disorder, OSA who presents to the ED for evaluation of shortness of breath. She states her symptoms began four days ago. She states she has been wheezing worse than her baseline. She reports she knows she has some wheezing at baseline typically. She states she has been coughing a lot as well. She states today it is getting to the point where she feels weak and dizzy when she gets into a coughing fit. She states she has been using all of her COPD meds with no relief. She has tried using her sister's albuterol nebulizer three times today with no relief. She states she does wear oxygen at night due to sleep apnea but does not require daytime oxygen.  Pt is also requesting assistance replacing her "oxygen machine." she states she was evicted from her apartment and was unable to take her CPAP with her.   Past Medical History  Diagnosis Date  . Asthma   . Fibromyalgia   . Bipolar disorder (HCC)   . HTN (hypertension)     ACEI cough   . HLD (hyperlipidemia)   . Osteoporosis   . Low back pain     2 ruptured discs in lower back   . Active smoker   . LVH (left ventricular hypertrophy)     echo (7/11) showed EF 70%, mild LVH, no sig valvular disease, moderate pericardial effussion. echo (9/11) showed no pericardial effusion, RF 65%, mild MR, normal RV size and systolic function.   . Sleep apnea   . DJD (degenerative joint disease)   . COPD (chronic obstructive pulmonary disease) (HCC)   . Anxiety   . Hypertension   . Bipolar affective (HCC)   . Chronic back pain    Past Surgical History  Procedure Laterality Date  . Cesarean section      x3   . Bilateral hand surgery    . Tubligation    . Tubal ligation    .  Cesarean section    . Nose surgery    . Carpal tunnel release     Family History  Problem Relation Age of Onset  . Coronary artery disease      family hx   Social History  Substance Use Topics  . Smoking status: Current Every Day Smoker  . Smokeless tobacco: Never Used  . Alcohol Use: Yes     Comment: occ   OB History    Gravida Para Term Preterm AB TAB SAB Ectopic Multiple Living   0 0 0 0 0 0 0 0       Review of Systems  All other systems reviewed and are negative.     Allergies  Review of patient's allergies indicates no known allergies.  Home Medications   Prior to Admission medications   Medication Sig Start Date End Date Taking? Authorizing Provider  albuterol (PROVENTIL) (2.5 MG/3ML) 0.083% nebulizer solution Take 3 mLs (2.5 mg total) by nebulization every 4 (four) hours as needed for wheezing or shortness of breath. 06/30/14   Sanjuana Kava, NP  ARIPiprazole (ABILIFY) 5 MG tablet Take 1 tablet (5 mg total) by mouth daily. For mood control 06/30/14  Sanjuana KavaAgnes I Nwoko, NP  cephALEXin (KEFLEX) 500 MG capsule Take 1 capsule (500 mg total) by mouth 4 (four) times daily. 11/01/15   Benjiman CoreNathan Pickering, MD  Cholecalciferol (VITAMIN D-3) 5000 UNITS TABS Take 1 tablet by mouth every morning. For bone health 06/30/14   Sanjuana KavaAgnes I Nwoko, NP  clonazePAM (KLONOPIN) 0.5 MG tablet Take 1 tablet (0.5 mg total) by mouth 3 (three) times daily as needed (anxiety). Patient not taking: Reported on 02/16/2015 06/30/14   Sanjuana KavaAgnes I Nwoko, NP  clonazePAM (KLONOPIN) 0.5 MG tablet Take 1 tablet (0.5 mg total) by mouth 2 (two) times daily. 06/30/14   Sanjuana KavaAgnes I Nwoko, NP  diazepam (VALIUM) 5 MG tablet Take 1 tablet (5 mg total) by mouth 2 (two) times daily. 07/08/15   Everlene FarrierWilliam Dansie, PA-C  esomeprazole (NEXIUM) 40 MG capsule Take 1 capsule (40 mg total) by mouth every morning. For acid reflux 06/30/14   Sanjuana KavaAgnes I Nwoko, NP  Fluticasone-Salmeterol (ADVAIR DISKUS) 250-50 MCG/DOSE AEPB Inhale 2 puffs into the lungs 2  (two) times daily as needed (wheezing, SOB). 06/30/14   Sanjuana KavaAgnes I Nwoko, NP  gabapentin (NEURONTIN) 300 MG capsule Take 2 capsules (600 mg total) by mouth 3 (three) times daily. For agitation/pain 06/30/14   Sanjuana KavaAgnes I Nwoko, NP  lamoTRIgine (LAMICTAL) 25 MG tablet Take 1 tablet (25 mg total) by mouth daily. For mood stabilization 06/30/14   Sanjuana KavaAgnes I Nwoko, NP  lisinopril (PRINIVIL,ZESTRIL) 20 MG tablet Take 20 mg by mouth daily.    Historical Provider, MD  lithium carbonate 300 MG capsule Take 1 capsule (300 mg total) by mouth 3 (three) times daily with meals. Mood stabilization 06/30/14   Sanjuana KavaAgnes I Nwoko, NP  naproxen (NAPROSYN) 250 MG tablet Take 1 tablet (250 mg total) by mouth 2 (two) times daily with a meal. 07/08/15   Everlene FarrierWilliam Dansie, PA-C  potassium chloride (K-DUR) 10 MEQ tablet Take 1 tablet (10 mEq total) by mouth every morning. For low potassium 06/30/14   Sanjuana KavaAgnes I Nwoko, NP  sertraline (ZOLOFT) 100 MG tablet Take 1 tablet (100 mg total) by mouth daily. For depression 06/30/14   Sanjuana KavaAgnes I Nwoko, NP   BP 165/98 mmHg  Pulse 103  Temp(Src) 98.2 F (36.8 C) (Oral)  Resp 20  Ht 5\' 6"  (1.676 m)  Wt 140.615 kg  BMI 50.06 kg/m2  SpO2 96%  LMP 03/07/2016 (Approximate) Physical Exam  Constitutional: She is oriented to person, place, and time.  Obese, NAD  HENT:  Right Ear: External ear normal.  Left Ear: External ear normal.  Nose: Nose normal.  Mouth/Throat: Oropharynx is clear and moist. No oropharyngeal exudate.  Eyes: Conjunctivae and EOM are normal. Pupils are equal, round, and reactive to light.  Neck: Normal range of motion. Neck supple.  Cardiovascular: Normal rate, regular rhythm, normal heart sounds and intact distal pulses.   Pulmonary/Chest: Effort normal and breath sounds normal. No respiratory distress. She exhibits no tenderness.  Normal effort. No tachypnea. Bilateral diffuse expiratory wheezing and rhonchi  Abdominal: Soft. Bowel sounds are normal. She exhibits no distension.   Musculoskeletal: She exhibits no edema.  No LE edema  Neurological: She is alert and oriented to person, place, and time. No cranial nerve deficit.  Skin: Skin is warm and dry.  Psychiatric: She has a normal mood and affect.  Nursing note and vitals reviewed.   ED Course  Procedures (including critical care time) Labs Review Labs Reviewed  BASIC METABOLIC PANEL - Abnormal; Notable for the following:    Potassium  3.4 (*)    Glucose, Bld 110 (*)    All other components within normal limits  CBC - Abnormal; Notable for the following:    Hemoglobin 11.5 (*)    MCV 77.1 (*)    MCH 23.1 (*)    RDW 17.0 (*)    All other components within normal limits    Imaging Review Dg Chest 2 View  04/01/2016  CLINICAL DATA:  Cough, shortness of breath for 4 days. EXAM: CHEST  2 VIEW COMPARISON:  02/01/2015 FINDINGS: The cardiomediastinal contours are normal. Bibasilar subsegmental atelectasis. Pulmonary vasculature is normal. No consolidation, pleural effusion, or pneumothorax. No acute osseous abnormalities are seen. IMPRESSION: Subsegmental bibasilar atelectasis. Electronically Signed   By: Rubye Oaks M.D.   On: 04/01/2016 20:44   I have personally reviewed and evaluated these images and lab results as part of my medical decision-making.   EKG Interpretation None      MDM   Final diagnoses:  COPD exacerbation (HCC)  Shortness of breath    Suspect COPD exacerbation. Lung sounds much improved after duoneb and then albuterol continuous neb. Pt has not been hypoxic at all. She is ambulatory with steady gait and remains asymptomatic with no de-sat. She states she wants to go home. CM working with pt to help get CPAP, nebulizer, etc for home use. Pt states she has COPD inhalers/meds at home and will call PCP for f/u next week. I will also give her a few days of prednisone. Strict ER return precautions given.     Carlene Coria, PA-C 04/01/16 2327  Lorre Nick, MD 04/04/16 1332

## 2016-04-01 NOTE — ED Notes (Signed)
Pt c/o Shob since Monday.  Today pt c/o shob, cough, weakness, HA and dizziness.  Pt took 3 neb tx PTA, as well as using her rescue and maintenance inhaler w/o relief.  Pt has expiratory wheezing in all lung fields.  Pt can speak in full sentences w/o difficult.

## 2016-04-01 NOTE — Discharge Instructions (Signed)
Your labs today were normal. Your chest x-ray showed no acute abnormalities. Please take all of your COPD medications as prescribed. I will also give you a few days of steroids. Follow up with your primary care provider early next week. Return to the ER for new or worsening symptoms.

## 2016-05-23 ENCOUNTER — Emergency Department (HOSPITAL_COMMUNITY)
Admission: EM | Admit: 2016-05-23 | Discharge: 2016-05-23 | Disposition: A | Discharge status: Home / Self Care | Payer: Self-pay | Attending: Emergency Medicine | Admitting: Emergency Medicine

## 2016-05-23 ENCOUNTER — Encounter (HOSPITAL_COMMUNITY): Payer: Self-pay | Admitting: Emergency Medicine

## 2016-05-23 ENCOUNTER — Emergency Department (HOSPITAL_COMMUNITY): Payer: Self-pay

## 2016-05-23 DIAGNOSIS — E785 Hyperlipidemia, unspecified: Secondary | ICD-10-CM | POA: Insufficient documentation

## 2016-05-23 DIAGNOSIS — I517 Cardiomegaly: Secondary | ICD-10-CM | POA: Insufficient documentation

## 2016-05-23 DIAGNOSIS — F17200 Nicotine dependence, unspecified, uncomplicated: Secondary | ICD-10-CM | POA: Insufficient documentation

## 2016-05-23 DIAGNOSIS — J449 Chronic obstructive pulmonary disease, unspecified: Secondary | ICD-10-CM | POA: Insufficient documentation

## 2016-05-23 DIAGNOSIS — I10 Essential (primary) hypertension: Secondary | ICD-10-CM | POA: Insufficient documentation

## 2016-05-23 DIAGNOSIS — Z7951 Long term (current) use of inhaled steroids: Secondary | ICD-10-CM | POA: Insufficient documentation

## 2016-05-23 DIAGNOSIS — N390 Urinary tract infection, site not specified: Secondary | ICD-10-CM | POA: Insufficient documentation

## 2016-05-23 DIAGNOSIS — R109 Unspecified abdominal pain: Secondary | ICD-10-CM

## 2016-05-23 DIAGNOSIS — J45909 Unspecified asthma, uncomplicated: Secondary | ICD-10-CM | POA: Insufficient documentation

## 2016-05-23 LAB — CBC WITH DIFFERENTIAL/PLATELET
Basophils Absolute: 0 10*3/uL (ref 0.0–0.1)
Basophils Relative: 0 %
Eosinophils Absolute: 0.1 10*3/uL (ref 0.0–0.7)
Eosinophils Relative: 1 %
HCT: 39.2 % (ref 36.0–46.0)
Hemoglobin: 11.8 g/dL — ABNORMAL LOW (ref 12.0–15.0)
Lymphocytes Relative: 19 %
Lymphs Abs: 2.8 10*3/uL (ref 0.7–4.0)
MCH: 23.2 pg — ABNORMAL LOW (ref 26.0–34.0)
MCHC: 30.1 g/dL (ref 30.0–36.0)
MCV: 77 fL — ABNORMAL LOW (ref 78.0–100.0)
Monocytes Absolute: 0.6 10*3/uL (ref 0.1–1.0)
Monocytes Relative: 4 %
Neutro Abs: 11.4 10*3/uL — ABNORMAL HIGH (ref 1.7–7.7)
Neutrophils Relative %: 76 %
Platelets: 319 10*3/uL (ref 150–400)
RBC: 5.09 MIL/uL (ref 3.87–5.11)
RDW: 17.6 % — ABNORMAL HIGH (ref 11.5–15.5)
WBC: 14.9 10*3/uL — ABNORMAL HIGH (ref 4.0–10.5)

## 2016-05-23 LAB — BASIC METABOLIC PANEL
Anion gap: 7 (ref 5–15)
BUN: 7 mg/dL (ref 6–20)
CO2: 29 mmol/L (ref 22–32)
Calcium: 8.7 mg/dL — ABNORMAL LOW (ref 8.9–10.3)
Chloride: 102 mmol/L (ref 101–111)
Creatinine, Ser: 0.69 mg/dL (ref 0.44–1.00)
GFR calc Af Amer: >60 mL/min (ref >60)
GFR calc non Af Amer: >60 mL/min (ref >60)
Glucose, Bld: 104 mg/dL — ABNORMAL HIGH (ref 65–99)
Potassium: 3.9 mmol/L (ref 3.5–5.1)
Sodium: 138 mmol/L (ref 135–145)

## 2016-05-23 LAB — URINALYSIS, ROUTINE W REFLEX MICROSCOPIC
Bilirubin Urine: NEGATIVE
Glucose, UA: NEGATIVE mg/dL
Hgb urine dipstick: NEGATIVE
Ketones, ur: NEGATIVE mg/dL
Nitrite: NEGATIVE
Protein, ur: NEGATIVE mg/dL
Specific Gravity, Urine: 1.012 (ref 1.005–1.030)
pH: 7.5 (ref 5.0–8.0)

## 2016-05-23 LAB — URINE MICROSCOPIC-ADD ON

## 2016-05-23 MED ORDER — HYDROMORPHONE HCL 1 MG/ML IJ SOLN
1.0000 mg | Freq: Once | INTRAMUSCULAR | Status: AC
  Administered 2016-05-23: 1 mg via INTRAVENOUS
  Filled 2016-05-23: qty 1

## 2016-05-23 MED ORDER — KETOROLAC TROMETHAMINE 15 MG/ML IJ SOLN
15.0000 mg | Freq: Once | INTRAMUSCULAR | Status: AC
  Administered 2016-05-23: 15 mg via INTRAVENOUS
  Filled 2016-05-23: qty 1

## 2016-05-23 MED ORDER — CEPHALEXIN 500 MG PO CAPS: 500.0000 mg | ORAL_CAPSULE | Freq: Three times a day (TID) | ORAL | Status: DC

## 2016-05-23 MED ORDER — SODIUM CHLORIDE 0.9 % IV BOLUS (SEPSIS)
1000.0000 mL | Freq: Once | INTRAVENOUS | Status: AC
  Administered 2016-05-23: 1000 mL via INTRAVENOUS

## 2016-05-23 MED ORDER — LORAZEPAM 2 MG/ML IJ SOLN
0.5000 mg | Freq: Once | INTRAMUSCULAR | Status: AC
  Administered 2016-05-23: 0.5 mg via INTRAVENOUS
  Filled 2016-05-23: qty 1

## 2016-05-23 MED ORDER — DEXTROSE 5 % IV SOLN
1.0000 g | Freq: Once | INTRAVENOUS | Status: AC
  Administered 2016-05-23: 1 g via INTRAVENOUS
  Filled 2016-05-23: qty 10

## 2016-05-23 MED ORDER — CEPHALEXIN 500 MG PO CAPS: 500.0000 mg | ORAL_CAPSULE | Freq: Four times a day (QID) | ORAL | Status: DC

## 2016-05-23 MED ORDER — ONDANSETRON HCL 4 MG/2ML IJ SOLN
4.0000 mg | Freq: Once | INTRAMUSCULAR | Status: AC
  Administered 2016-05-23: 4 mg via INTRAVENOUS
  Filled 2016-05-23: qty 2

## 2016-05-23 NOTE — ED Notes (Signed)
Per patient, she is having sharp abdominal pain that radiates to her right and left lower back.  Patient states her pain started 6-10 and has increased overnight.  Family history of kidney stones.  Denies any trauma or injury.  Some nausea but not vomiting.

## 2016-05-23 NOTE — ED Notes (Signed)
Pt asked for DC pain med Rx.  Rn spoke to Dr. Juleen ChinaKohut who will not give Rx due to Pt already having Rx for Oxy

## 2016-05-23 NOTE — Discharge Instructions (Signed)

## 2016-05-23 NOTE — ED Provider Notes (Signed)
CSN: [id number]     Arrival date & time 05/23/16  0909 History   First MD Initiated Contact with Patient 05/23/16 [id number]     Chief Complaint  Patient presents with  . Flank Pain  . Abdominal Pain     (Consider location/radiation/quality/duration/timing/severity/associated sxs/prior Treatment) HPI   40 year old female with lower back pain. Gradual onset yesterday. Persistent since then. Pain is constant but has waxing and waning sharper pains. She has some pain into bilateral flanks and also has a pressure sensation in her groin. She has not noticed anything that seems to make her symptoms better or worse. No change with movement. Nauseated. No vomiting. No unusual vaginal bleeding or discharge. No specific urinary complaints. No fevers or chills. No change in her bowel movements. She reports chronic back pain but that current symptoms are different. No radiation to her lower extremities. No numbness or tingling. No incontinence. She is not on any blood thinners. She has never had back surgery. Is tried taking Tylenol and oxycodone which have not significantly helped.  Past Medical History  Diagnosis Date  . Asthma   . Fibromyalgia   . Bipolar disorder (HCC)   . HTN (hypertension)     ACEI cough   . HLD (hyperlipidemia)   . Osteoporosis   . Low back pain     2 ruptured discs in lower back   . Active smoker   . LVH (left ventricular hypertrophy)     echo (7/11) showed EF 70%, mild LVH, no sig valvular disease, moderate pericardial effussion. echo (9/11) showed no pericardial effusion, RF 65%, mild MR, normal RV size and systolic function.   . Sleep apnea   . DJD (degenerative joint disease)   . COPD (chronic obstructive pulmonary disease) (HCC)   . Anxiety   . Hypertension   . Bipolar affective (HCC)   . Chronic back pain    Past Surgical History  Procedure Laterality Date  . Cesarean section      x3   . Bilateral hand surgery    . Tubligation    . Tubal ligation    . Cesarean  section    . Nose surgery    . Carpal tunnel release     Family History  Problem Relation Age of Onset  . Coronary artery disease      family hx   Social History  Substance Use Topics  . Smoking status: Current Every Day Smoker  . Smokeless tobacco: Never Used  . Alcohol Use: Yes     Comment: occ   OB History    Gravida Para Term Preterm AB TAB SAB Ectopic Multiple Living   0 0 0 0 0 0 0 0       Review of Systems  All systems reviewed and negative, other than as noted in HPI.   Allergies  Review of patient's allergies indicates no known allergies.  Home Medications   Prior to Admission medications   Medication Sig Start Date End Date Taking? Authorizing Provider  albuterol (PROVENTIL HFA;VENTOLIN HFA) 108 (90 Base) MCG/ACT inhaler Inhale 2 puffs into the lungs every 6 (six) hours as needed for wheezing or shortness of breath.    Historical Provider, MD  albuterol (PROVENTIL) (2.5 MG/3ML) 0.083% nebulizer solution Take 3 mLs (2.5 mg total) by nebulization every 4 (four) hours as needed for wheezing or shortness of breath. 06/30/14   Sanjuana KavaAgnes I Nwoko, NP  ARIPiprazole (ABILIFY) 5 MG tablet Take 1 tablet (5 mg total) by mouth daily.  For mood control Patient not taking: Reported on 04/01/2016 06/30/14   Sanjuana Kava, NP  clonazePAM (KLONOPIN) 0.5 MG tablet Take 1 tablet (0.5 mg total) by mouth 3 (three) times daily as needed (anxiety). Patient not taking: Reported on 02/16/2015 06/30/14   Sanjuana Kava, NP  clonazePAM (KLONOPIN) 0.5 MG tablet Take 1 tablet (0.5 mg total) by mouth 2 (two) times daily. Patient not taking: Reported on 04/01/2016 06/30/14   Sanjuana Kava, NP  diazepam (VALIUM) 5 MG tablet Take 1 tablet (5 mg total) by mouth 2 (two) times daily. Patient not taking: Reported on 04/01/2016 07/08/15   Everlene Farrier, PA-C  esomeprazole (NEXIUM) 40 MG capsule Take 1 capsule (40 mg total) by mouth every morning. For acid reflux 06/30/14   Sanjuana Kava, NP  Fluticasone-Salmeterol  (ADVAIR DISKUS) 250-50 MCG/DOSE AEPB Inhale 2 puffs into the lungs 2 (two) times daily as needed (wheezing, SOB). Patient taking differently: Inhale 2 puffs into the lungs 2 (two) times daily.  06/30/14   Sanjuana Kava, NP  gabapentin (NEURONTIN) 300 MG capsule Take 2 capsules (600 mg total) by mouth 3 (three) times daily. For agitation/pain 06/30/14   Sanjuana Kava, NP  lamoTRIgine (LAMICTAL) 25 MG tablet Take 1 tablet (25 mg total) by mouth daily. For mood stabilization 06/30/14   Sanjuana Kava, NP  lisinopril (PRINIVIL,ZESTRIL) 30 MG tablet Take 30 mg by mouth at bedtime.    Historical Provider, MD  lithium carbonate 300 MG capsule Take 1 capsule (300 mg total) by mouth 3 (three) times daily with meals. Mood stabilization Patient not taking: Reported on 04/01/2016 06/30/14   Sanjuana Kava, NP  mometasone-formoterol (DULERA) 200-5 MCG/ACT AERO Inhale 2 puffs into the lungs 2 (two) times daily.    Historical Provider, MD  Oxycodone HCl 10 MG TABS Take 10 mg by mouth 3 (three) times daily.    Historical Provider, MD  potassium chloride (K-DUR) 10 MEQ tablet Take 1 tablet (10 mEq total) by mouth every morning. For low potassium Patient not taking: Reported on 04/01/2016 06/30/14   Sanjuana Kava, NP  predniSONE (DELTASONE) 20 MG tablet Take 2 tablets (40 mg total) by mouth daily. 04/01/16   Ace Gins Sam, PA-C  sertraline (ZOLOFT) 100 MG tablet Take 1 tablet (100 mg total) by mouth daily. For depression Patient not taking: Reported on 04/01/2016 06/30/14   Sanjuana Kava, NP  tiotropium (SPIRIVA) 18 MCG inhalation capsule Place 18 mcg into inhaler and inhale daily.    Historical Provider, MD   BP 192/108 mmHg  Pulse 86  Temp(Src) 98.9 F (37.2 C) (Oral)  Resp 22  SpO2 98%  LMP 05/01/2016 Physical Exam  Constitutional:  Laying in bed. Appears uncomfortable. Crying at times. Obese.  HENT:  Head: Normocephalic and atraumatic.  Eyes: Conjunctivae are normal. Right eye exhibits no discharge. Left eye  exhibits no discharge.  Neck: Neck supple.  Cardiovascular: Normal rate, regular rhythm and normal heart sounds.  Exam reveals no gallop and no friction rub.   No murmur heard. Pulmonary/Chest: Effort normal and breath sounds normal. No respiratory distress.  Abdominal: Soft. She exhibits no distension. There is tenderness.  Mild lower abdominal tenderness. Doesn't particularly lateralize. No rebound or guarding. No CVA tenderness.  Musculoskeletal: She exhibits no edema or tenderness.  Neurological: She is alert.  Skin: Skin is warm and dry.  Psychiatric: She has a normal mood and affect. Her behavior is normal. Thought content normal.  Nursing note and vitals  reviewed.   ED Course  Procedures (including critical care time) Labs Review Labs Reviewed  URINE CULTURE - Abnormal; Notable for the following:    Culture >=100,000 COLONIES/mL ESCHERICHIA COLI (*)    Organism ID, Bacteria ESCHERICHIA COLI (*)    All other components within normal limits  URINALYSIS, ROUTINE W REFLEX MICROSCOPIC (NOT AT [facility]) - Abnormal; Notable for the following:    Leukocytes, UA SMALL (*)    All other components within normal limits  CBC WITH DIFFERENTIAL/PLATELET - Abnormal; Notable for the following:    WBC 14.9 (*)    Hemoglobin 11.8 (*)    MCV 77.0 (*)    MCH 23.2 (*)    RDW 17.6 (*)    Neutro Abs 11.4 (*)    All other components within normal limits  BASIC METABOLIC PANEL - Abnormal; Notable for the following:    Glucose, Bld 104 (*)    Calcium 8.7 (*)    All other components within normal limits  URINE MICROSCOPIC-ADD ON - Abnormal; Notable for the following:    Squamous Epithelial / LPF 0-5 (*)    Bacteria, UA MANY (*)    All other components within normal limits    Imaging Review No results found.   Ct Abdomen Pelvis Wo Contrast  05/23/2016  CLINICAL DATA:  Abdominal pain radiating into the low back which began last night. Nausea. EXAM: CT ABDOMEN AND PELVIS WITHOUT CONTRAST  TECHNIQUE: Multidetector CT imaging of the abdomen and pelvis was performed following the standard protocol without IV contrast. COMPARISON:  CT abdomen and pelvis 08/30/2007. FINDINGS: Small focus of airspace opacity is seen in the left lower lobe. Lung bases are otherwise clear. No pleural or pericardial effusion. The liver is diffusely low attenuating consistent with fatty infiltration. No focal liver lesion. The gallbladder, adrenal glands, spleen and pancreas appear normal. Punctate nonobstructing stone mid to upper pole right kidney is noted. The left kidney is unremarkable. Ureters, urinary bladder, uterus and adnexa all appear normal. A few sigmoid diverticula are identified. The colon is otherwise unremarkable. The stomach, small bowel and appendix appear normal. Tiny fat containing umbilical hernia is noted. Degenerative disc disease is seen at L4-5 and L5-S1. No worrisome bony lesion is identified. IMPRESSION: Patchy airspace opacity in the left lung base is likely due to atelectasis rather than pneumonia. Punctate nonobstructing stone right kidney. The urinary tracts are otherwise unremarkable. No hydronephrosis. Fatty infiltration of the liver. Small fat containing umbilical hernia. Mild sigmoid diverticulosis without diverticulitis. Electronically Signed   By: Drusilla Kanner M.D.   On: 05/23/2016 11:28   I have personally reviewed and evaluated these images and lab results as part of my medical decision-making.   EKG Interpretation None      MDM   Final diagnoses:  UTI (lower urinary tract infection)  Abdominal pain, unspecified abdominal location    40 year old female with lower back pain. Suspect she may have kidney stones. The pain radiates into her flank and she has a pressure sensation in her groin. She has chronic back pain, but states the current symptoms feel different. She has no acute neurological complaints. She has a nonfocal neurological examination. No suicidal back pain  is a consideration, but I feel this is less likely. No symptoms or other signs particular concerning for cord compression. Doubt AAA or dissection. Will treat her symptoms. Will check basic labs and urinalysis. CT abdomen and pelvis.    Raeford Razor, MD 06/03/16 2129

## 2016-05-24 ENCOUNTER — Emergency Department (HOSPITAL_COMMUNITY)
Admission: EM | Admit: 2016-05-24 | Discharge: 2016-05-25 | Disposition: A | Discharge status: Home / Self Care | Payer: No Typology Code available for payment source | Attending: Emergency Medicine | Admitting: Emergency Medicine

## 2016-05-24 ENCOUNTER — Encounter (HOSPITAL_COMMUNITY): Payer: Self-pay | Admitting: Family Medicine

## 2016-05-24 DIAGNOSIS — Z8679 Personal history of other diseases of the circulatory system: Secondary | ICD-10-CM | POA: Insufficient documentation

## 2016-05-24 DIAGNOSIS — I119 Hypertensive heart disease without heart failure: Secondary | ICD-10-CM | POA: Insufficient documentation

## 2016-05-24 DIAGNOSIS — R1084 Generalized abdominal pain: Secondary | ICD-10-CM

## 2016-05-24 DIAGNOSIS — E785 Hyperlipidemia, unspecified: Secondary | ICD-10-CM | POA: Insufficient documentation

## 2016-05-24 DIAGNOSIS — J449 Chronic obstructive pulmonary disease, unspecified: Secondary | ICD-10-CM | POA: Insufficient documentation

## 2016-05-24 DIAGNOSIS — Z79899 Other long term (current) drug therapy: Secondary | ICD-10-CM | POA: Insufficient documentation

## 2016-05-24 DIAGNOSIS — J45909 Unspecified asthma, uncomplicated: Secondary | ICD-10-CM | POA: Insufficient documentation

## 2016-05-24 DIAGNOSIS — Z791 Long term (current) use of non-steroidal anti-inflammatories (NSAID): Secondary | ICD-10-CM | POA: Insufficient documentation

## 2016-05-24 DIAGNOSIS — F17210 Nicotine dependence, cigarettes, uncomplicated: Secondary | ICD-10-CM | POA: Insufficient documentation

## 2016-05-24 DIAGNOSIS — F319 Bipolar disorder, unspecified: Secondary | ICD-10-CM | POA: Insufficient documentation

## 2016-05-24 DIAGNOSIS — M1990 Unspecified osteoarthritis, unspecified site: Secondary | ICD-10-CM | POA: Insufficient documentation

## 2016-05-24 LAB — URINALYSIS, ROUTINE W REFLEX MICROSCOPIC
BILIRUBIN URINE: NEGATIVE
GLUCOSE, UA: NEGATIVE mg/dL
HGB URINE DIPSTICK: NEGATIVE
KETONES UR: NEGATIVE mg/dL
Nitrite: NEGATIVE
PROTEIN: NEGATIVE mg/dL
Specific Gravity, Urine: 1.018 (ref 1.005–1.030)
pH: 6.5 (ref 5.0–8.0)

## 2016-05-24 LAB — COMPREHENSIVE METABOLIC PANEL
ALK PHOS: 73 U/L (ref 38–126)
ALT: 16 U/L (ref 14–54)
AST: 19 U/L (ref 15–41)
Albumin: 3.5 g/dL (ref 3.5–5.0)
Anion gap: 6 (ref 5–15)
BUN: 6 mg/dL (ref 6–20)
CALCIUM: 8.5 mg/dL — AB (ref 8.9–10.3)
CHLORIDE: 106 mmol/L (ref 101–111)
CO2: 26 mmol/L (ref 22–32)
CREATININE: 0.91 mg/dL (ref 0.44–1.00)
GFR calc non Af Amer: >60 mL/min (ref >60)
GLUCOSE: 105 mg/dL — AB (ref 65–99)
Potassium: 3.6 mmol/L (ref 3.5–5.1)
SODIUM: 138 mmol/L (ref 135–145)
Total Bilirubin: 0.3 mg/dL (ref 0.3–1.2)
Total Protein: 6.9 g/dL (ref 6.5–8.1)

## 2016-05-24 LAB — CBC
HCT: 36 % (ref 36.0–46.0)
Hemoglobin: 10.8 g/dL — ABNORMAL LOW (ref 12.0–15.0)
MCH: 22.8 pg — AB (ref 26.0–34.0)
MCHC: 30 g/dL (ref 30.0–36.0)
MCV: 76.1 fL — AB (ref 78.0–100.0)
PLATELETS: 278 10*3/uL (ref 150–400)
RBC: 4.73 MIL/uL (ref 3.87–5.11)
RDW: 17.6 % — AB (ref 11.5–15.5)
WBC: 15.3 10*3/uL — ABNORMAL HIGH (ref 4.0–10.5)

## 2016-05-24 LAB — LIPASE, BLOOD: Lipase: 27 U/L (ref 11–51)

## 2016-05-24 LAB — URINE MICROSCOPIC-ADD ON: RBC / HPF: NONE SEEN RBC/hpf (ref 0–5)

## 2016-05-24 LAB — POC URINE PREG, ED: PREG TEST UR: NEGATIVE

## 2016-05-24 NOTE — ED Notes (Signed)
Patient is complaining generalized abd pain with lower back pain. Denies any nausea, vomiting, or diarrhea. [facility] and dx with kidney infection.

## 2016-05-25 LAB — URINE CULTURE: Culture: >=100000 — AB

## 2016-05-25 MED ORDER — OXYCODONE-ACETAMINOPHEN 5-325 MG PO TABS: 1.0000 | ORAL_TABLET | ORAL | Status: DC | PRN

## 2016-05-25 MED ORDER — OXYCODONE-ACETAMINOPHEN 5-325 MG PO TABS
2.0000 | ORAL_TABLET | Freq: Once | ORAL | Status: AC
  Administered 2016-05-25: 2 via ORAL
  Filled 2016-05-25: qty 2

## 2016-05-25 NOTE — ED Provider Notes (Signed)
CSN: [id number]     Arrival date & time 05/24/16  2129 History   First MD Initiated Contact with Patient 05/25/16 0052     Chief Complaint  Patient presents with  . Abdominal Pain     (Consider location/radiation/quality/duration/timing/severity/associated sxs/prior Treatment) HPI Comments: Patient presents with complaint of generalized progressively worsening abdominal pain. She was seen yesterday for abdominal pain and diagnosed with urinary tract infection. She has been taking the antibiotic as prescribed but abdominal pain worsened throughout today. No fever, nausea, vomiting, diarrhea or constipation. She denies melena or blood in her stool. She states her urinary urgency is improved.   Patient is a 40 y.o. female presenting with abdominal pain. The history is provided by the patient. No language interpreter was used.  Abdominal Pain Pain location:  Generalized Pain severity:  Moderate Onset quality:  Gradual Duration:  2 days Associated symptoms: no chest pain, no chills, no constipation, no cough, no diarrhea, no dysuria, no fever, no nausea and no vomiting     Past Medical History  Diagnosis Date  . Asthma   . Fibromyalgia   . Bipolar disorder (HCC)   . HTN (hypertension)     ACEI cough   . HLD (hyperlipidemia)   . Osteoporosis   . Low back pain     2 ruptured discs in lower back   . Active smoker   . LVH (left ventricular hypertrophy)     echo (7/11) showed EF 70%, mild LVH, no sig valvular disease, moderate pericardial effussion. echo (9/11) showed no pericardial effusion, RF 65%, mild MR, normal RV size and systolic function.   . Sleep apnea   . DJD (degenerative joint disease)   . COPD (chronic obstructive pulmonary disease) (HCC)   . Anxiety   . Hypertension   . Bipolar affective (HCC)   . Chronic back pain    Past Surgical History  Procedure Laterality Date  . Cesarean section      x3   . Bilateral hand surgery    . Tubligation    . Tubal ligation    .  Cesarean section    . Nose surgery    . Carpal tunnel release     Family History  Problem Relation Age of Onset  . Coronary artery disease      family hx   Social History  Substance Use Topics  . Smoking status: Current Every Day Smoker -- 1.00 packs/day    Types: Cigarettes  . Smokeless tobacco: Never Used  . Alcohol Use: Yes     Comment: Twice a month.    OB History    Gravida Para Term Preterm AB TAB SAB Ectopic Multiple Living   0 0 0 0 0 0 0 0       Review of Systems  Constitutional: Negative for fever and chills.  Respiratory: Negative.  Negative for cough.   Cardiovascular: Negative for chest pain.  Gastrointestinal: Positive for abdominal pain. Negative for nausea, vomiting, diarrhea and constipation.  Genitourinary: Negative for dysuria and flank pain.  Musculoskeletal: Negative.  Negative for back pain.  Skin: Negative.   Neurological: Negative.       Allergies  Review of patient's allergies indicates no known allergies.  Home Medications   Prior to Admission medications   Medication Sig Start Date End Date Taking? Authorizing Provider  acetaminophen (TYLENOL) 500 MG tablet Take 1,500 mg by mouth every 6 (six) hours as needed for mild pain, moderate pain, fever or headache.   Yes  Historical Provider, MD  albuterol (PROVENTIL HFA;VENTOLIN HFA) 108 (90 Base) MCG/ACT inhaler Inhale 2 puffs into the lungs every 6 (six) hours as needed for wheezing or shortness of breath.   Yes Historical Provider, MD  albuterol (PROVENTIL) (2.5 MG/3ML) 0.083% nebulizer solution Take 3 mLs (2.5 mg total) by nebulization every 4 (four) hours as needed for wheezing or shortness of breath. 06/30/14  Yes Sanjuana Kava, NP  cephALEXin (KEFLEX) 500 MG capsule Take 1 capsule (500 mg total) by mouth 4 (four) times daily. 05/23/16  Yes Raeford Razor, MD  Fluticasone-Salmeterol (ADVAIR DISKUS) 250-50 MCG/DOSE AEPB Inhale 2 puffs into the lungs 2 (two) times daily as needed (wheezing,  SOB). Patient taking differently: Inhale 2 puffs into the lungs 2 (two) times daily.  06/30/14  Yes Sanjuana Kava, NP  gabapentin (NEURONTIN) 300 MG capsule Take 2 capsules (600 mg total) by mouth 3 (three) times daily. For agitation/pain 06/30/14  Yes Sanjuana Kava, NP  lamoTRIgine (LAMICTAL) 25 MG tablet Take 1 tablet (25 mg total) by mouth daily. For mood stabilization 06/30/14  Yes Sanjuana Kava, NP  lisinopril (PRINIVIL,ZESTRIL) 30 MG tablet Take 30 mg by mouth at bedtime.   Yes Historical Provider, MD  mometasone-formoterol (DULERA) 200-5 MCG/ACT AERO Inhale 2 puffs into the lungs 2 (two) times daily.   Yes Historical Provider, MD  tiotropium (SPIRIVA) 18 MCG inhalation capsule Place 18 mcg into inhaler and inhale daily.   Yes Historical Provider, MD  ARIPiprazole (ABILIFY) 5 MG tablet Take 1 tablet (5 mg total) by mouth daily. For mood control Patient not taking: Reported on 04/01/2016 06/30/14   Sanjuana Kava, NP  clonazePAM (KLONOPIN) 0.5 MG tablet Take 1 tablet (0.5 mg total) by mouth 3 (three) times daily as needed (anxiety). Patient not taking: Reported on 02/16/2015 06/30/14   Sanjuana Kava, NP  clonazePAM (KLONOPIN) 0.5 MG tablet Take 1 tablet (0.5 mg total) by mouth 2 (two) times daily. Patient not taking: Reported on 04/01/2016 06/30/14   Sanjuana Kava, NP  diazepam (VALIUM) 5 MG tablet Take 1 tablet (5 mg total) by mouth 2 (two) times daily. Patient not taking: Reported on 04/01/2016 07/08/15   Everlene Farrier, PA-C  esomeprazole (NEXIUM) 40 MG capsule Take 1 capsule (40 mg total) by mouth every morning. For acid reflux Patient not taking: Reported on 05/23/2016 06/30/14   Sanjuana Kava, NP  lithium carbonate 300 MG capsule Take 1 capsule (300 mg total) by mouth 3 (three) times daily with meals. Mood stabilization Patient not taking: Reported on 04/01/2016 06/30/14   Sanjuana Kava, NP  potassium chloride (K-DUR) 10 MEQ tablet Take 1 tablet (10 mEq total) by mouth every morning. For low  potassium Patient not taking: Reported on 04/01/2016 06/30/14   Sanjuana Kava, NP  predniSONE (DELTASONE) 20 MG tablet Take 2 tablets (40 mg total) by mouth daily. Patient not taking: Reported on 05/23/2016 04/01/16   Ace Gins Sam, PA-C  sertraline (ZOLOFT) 100 MG tablet Take 1 tablet (100 mg total) by mouth daily. For depression Patient not taking: Reported on 04/01/2016 06/30/14   Sanjuana Kava, NP   BP 161/94 mmHg  Pulse 79  Temp(Src) 99.3 F (37.4 C) (Oral)  Resp 16  Ht 5\' 6"  (1.676 m)  Wt 140.615 kg  BMI 50.06 kg/m2  SpO2 98%  LMP 05/01/2016 Physical Exam  Constitutional: She is oriented to person, place, and time. She appears well-developed and well-nourished.  HENT:  Head: Normocephalic.  Neck:  Normal range of motion. Neck supple.  Cardiovascular: Normal rate and regular rhythm.   Pulmonary/Chest: Effort normal and breath sounds normal. She has no wheezes. She has no rales.  Abdominal: Soft. Bowel sounds are normal. There is tenderness (Diffuse abdominal tenderness. ). There is no rebound and no guarding.  Musculoskeletal: Normal range of motion.  Neurological: She is alert and oriented to person, place, and time.  Skin: Skin is warm and dry. No rash noted.  Psychiatric: She has a normal mood and affect.    ED Course  Procedures (including critical care time) Labs Review Labs Reviewed  COMPREHENSIVE METABOLIC PANEL - Abnormal; Notable for the following:    Glucose, Bld 105 (*)    Calcium 8.5 (*)    All other components within normal limits  CBC - Abnormal; Notable for the following:    WBC 15.3 (*)    Hemoglobin 10.8 (*)    MCV 76.1 (*)    MCH 22.8 (*)    RDW 17.6 (*)    All other components within normal limits  URINALYSIS, ROUTINE W REFLEX MICROSCOPIC (NOT AT [facility]) - Abnormal; Notable for the following:    APPearance CLOUDY (*)    Leukocytes, UA MODERATE (*)    All other components within normal limits  URINE MICROSCOPIC-ADD ON - Abnormal; Notable for the  following:    Squamous Epithelial / LPF 0-5 (*)    Bacteria, UA RARE (*)    All other components within normal limits  LIPASE, BLOOD  POC URINE PREG, ED    Imaging Review Ct Abdomen Pelvis Wo Contrast  05/23/2016  CLINICAL DATA:  Abdominal pain radiating into the low back which began last night. Nausea. EXAM: CT ABDOMEN AND PELVIS WITHOUT CONTRAST TECHNIQUE: Multidetector CT imaging of the abdomen and pelvis was performed following the standard protocol without IV contrast. COMPARISON:  CT abdomen and pelvis 08/30/2007. FINDINGS: Small focus of airspace opacity is seen in the left lower lobe. Lung bases are otherwise clear. No pleural or pericardial effusion. The liver is diffusely low attenuating consistent with fatty infiltration. No focal liver lesion. The gallbladder, adrenal glands, spleen and pancreas appear normal. Punctate nonobstructing stone mid to upper pole right kidney is noted. The left kidney is unremarkable. Ureters, urinary bladder, uterus and adnexa all appear normal. A few sigmoid diverticula are identified. The colon is otherwise unremarkable. The stomach, small bowel and appendix appear normal. Tiny fat containing umbilical hernia is noted. Degenerative disc disease is seen at L4-5 and L5-S1. No worrisome bony lesion is identified. IMPRESSION: Patchy airspace opacity in the left lung base is likely due to atelectasis rather than pneumonia. Punctate nonobstructing stone right kidney. The urinary tracts are otherwise unremarkable. No hydronephrosis. Fatty infiltration of the liver. Small fat containing umbilical hernia. Mild sigmoid diverticulosis without diverticulitis. Electronically Signed   By: Drusilla Kanner M.D.   On: 05/23/2016 11:28   I have personally reviewed and evaluated these images and lab results as part of my medical decision-making.   EKG Interpretation None      MDM   Final diagnoses:  None    1. Abdominal pain  Patient presents with generalized  progressively worsening abdominal pain after being seen yesterday and diagnosed with UTI. No new symptoms, urinary symptoms improving. No bowel abnormalities. Review of work up yesterday includes CT scan with no abnormalities.   Discussed discharge home with pain medications with caution against constipation. Discussed specific return precautions. Encouraged PCP follow up for persistent symptoms.   Melvenia Beam  Junius Finner 05/25/16 0251  April Palumbo, MD 05/25/16 [phone]

## 2016-05-25 NOTE — Discharge Instructions (Signed)

## 2016-05-26 ENCOUNTER — Telehealth (HOSPITAL_BASED_OUTPATIENT_CLINIC_OR_DEPARTMENT_OTHER): Payer: Self-pay | Admitting: Emergency Medicine

## 2016-05-26 NOTE — Telephone Encounter (Signed)
Post ED Visit - Positive Culture Follow-up  Culture report reviewed by antimicrobial stewardship pharmacist:  []  Enzo BiNathan Batchelder, Pharm.D. []  Celedonio MiyamotoJeremy Frens, Pharm.D., BCPS []  Garvin FilaMike Maccia, Pharm.D. []  Georgina PillionElizabeth Martin, Pharm.D., BCPS []  SmithwickMinh Pham, [place]arm[place]D., BCPS, AAHIVP []  Estella HuskMichelle Turner, Pharm.D., BCPS, AAHIVP [x]  Tennis Mustassie Stewart, Pharm.D. []  Sherle Poeob Vincent, [address]Pharm[address]  Positive urine culture Treated with cephalexin, organism sensitive to the same and no further patient follow-up is required at this time.  Berle MullMiller, Kylyn Sookram 05/26/2016, 10:48 AM

## 2017-02-09 ENCOUNTER — Emergency Department: Payer: Self-pay

## 2017-02-09 ENCOUNTER — Encounter: Payer: Self-pay | Admitting: *Deleted

## 2017-02-09 ENCOUNTER — Emergency Department
Admission: EM | Admit: 2017-02-09 | Discharge: 2017-02-09 | Disposition: A | Discharge status: Home / Self Care | Payer: Self-pay | Attending: Emergency Medicine | Admitting: Emergency Medicine

## 2017-02-09 DIAGNOSIS — J45909 Unspecified asthma, uncomplicated: Secondary | ICD-10-CM | POA: Insufficient documentation

## 2017-02-09 DIAGNOSIS — J449 Chronic obstructive pulmonary disease, unspecified: Secondary | ICD-10-CM | POA: Insufficient documentation

## 2017-02-09 DIAGNOSIS — I10 Essential (primary) hypertension: Secondary | ICD-10-CM | POA: Insufficient documentation

## 2017-02-09 DIAGNOSIS — J40 Bronchitis, not specified as acute or chronic: Secondary | ICD-10-CM

## 2017-02-09 DIAGNOSIS — F17210 Nicotine dependence, cigarettes, uncomplicated: Secondary | ICD-10-CM | POA: Insufficient documentation

## 2017-02-09 DIAGNOSIS — Z79899 Other long term (current) drug therapy: Secondary | ICD-10-CM | POA: Insufficient documentation

## 2017-02-09 DIAGNOSIS — J209 Acute bronchitis, unspecified: Secondary | ICD-10-CM | POA: Insufficient documentation

## 2017-02-09 DIAGNOSIS — R319 Hematuria, unspecified: Secondary | ICD-10-CM | POA: Insufficient documentation

## 2017-02-09 LAB — BASIC METABOLIC PANEL
ANION GAP: 5 (ref 5–15)
BUN: 9 mg/dL (ref 6–20)
CO2: 29 mmol/L (ref 22–32)
Calcium: 8.9 mg/dL (ref 8.9–10.3)
Chloride: 105 mmol/L (ref 101–111)
Creatinine, Ser: 0.62 mg/dL (ref 0.44–1.00)
GFR calc Af Amer: >60 mL/min (ref >60)
GLUCOSE: 112 mg/dL — AB (ref 65–99)
POTASSIUM: 3.7 mmol/L (ref 3.5–5.1)
SODIUM: 139 mmol/L (ref 135–145)

## 2017-02-09 LAB — URINALYSIS, COMPLETE (UACMP) WITH MICROSCOPIC
Bacteria, UA: NONE SEEN
Bilirubin Urine: NEGATIVE
GLUCOSE, UA: NEGATIVE mg/dL
Ketones, ur: NEGATIVE mg/dL
Leukocytes, UA: NEGATIVE
Nitrite: NEGATIVE
PROTEIN: NEGATIVE mg/dL
SPECIFIC GRAVITY, URINE: 1.024 (ref 1.005–1.030)
pH: 6 (ref 5.0–8.0)

## 2017-02-09 LAB — POCT RAPID STREP A: Streptococcus, Group A Screen (Direct): NEGATIVE

## 2017-02-09 LAB — CBC
HCT: 33.9 % — ABNORMAL LOW (ref 35.0–47.0)
Hemoglobin: 11.1 g/dL — ABNORMAL LOW (ref 12.0–16.0)
MCH: 23.7 pg — ABNORMAL LOW (ref 26.0–34.0)
MCHC: 32.7 g/dL (ref 32.0–36.0)
MCV: 72.5 fL — AB (ref 80.0–100.0)
PLATELETS: 303 10*3/uL (ref 150–440)
RBC: 4.68 MIL/uL (ref 3.80–5.20)
RDW: 18.5 % — ABNORMAL HIGH (ref 11.5–14.5)
WBC: 8.1 10*3/uL (ref 3.6–11.0)

## 2017-02-09 LAB — POCT PREGNANCY, URINE: Preg Test, Ur: NEGATIVE

## 2017-02-09 LAB — TROPONIN I

## 2017-02-09 MED ORDER — ALBUTEROL SULFATE HFA 108 (90 BASE) MCG/ACT IN AERS: 2.0000 | INHALATION_SPRAY | Freq: Four times a day (QID) | RESPIRATORY_TRACT | 0 refills | Status: DC | PRN

## 2017-02-09 MED ORDER — PREDNISONE 20 MG PO TABS: 60.0000 mg | ORAL_TABLET | Freq: Every day | ORAL | 0 refills | Status: DC

## 2017-02-09 MED ORDER — ALBUTEROL SULFATE (2.5 MG/3ML) 0.083% IN NEBU
2.5000 mg | INHALATION_SOLUTION | Freq: Once | RESPIRATORY_TRACT | Status: AC
  Administered 2017-02-09: 2.5 mg via RESPIRATORY_TRACT
  Filled 2017-02-09: qty 3

## 2017-02-09 MED ORDER — IPRATROPIUM-ALBUTEROL 0.5-2.5 (3) MG/3ML IN SOLN
3.0000 mL | Freq: Once | RESPIRATORY_TRACT | Status: AC
  Administered 2017-02-09: 3 mL via RESPIRATORY_TRACT
  Filled 2017-02-09: qty 3

## 2017-02-09 MED ORDER — AZITHROMYCIN 250 MG PO TABS: ORAL_TABLET | ORAL | 0 refills | Status: AC

## 2017-02-09 MED ORDER — AZITHROMYCIN 500 MG PO TABS
500.0000 mg | ORAL_TABLET | Freq: Once | ORAL | Status: AC
  Administered 2017-02-09: 500 mg via ORAL
  Filled 2017-02-09: qty 1

## 2017-02-09 MED ORDER — MAGNESIUM SULFATE 2 GM/50ML IV SOLN
2.0000 g | Freq: Once | INTRAVENOUS | Status: AC
  Administered 2017-02-09: 2 g via INTRAVENOUS
  Filled 2017-02-09: qty 50

## 2017-02-09 MED ORDER — METHYLPREDNISOLONE SODIUM SUCC 125 MG IJ SOLR
125.0000 mg | Freq: Once | INTRAMUSCULAR | Status: AC
  Administered 2017-02-09: 125 mg via INTRAVENOUS
  Filled 2017-02-09: qty 2

## 2017-02-09 NOTE — Discharge Instructions (Addendum)
Please take your medicine and follow up with her primary care physician.  We believe that your symptoms are caused today by an exacerbation of your COPD, and possibly bronchitis.  Please take the prescribed medications and any medications that you have at home for your COPD.  Follow up with your doctor as recommended.  If you develop any new or worsening symptoms, including but not limited to fever, persistent vomiting, worsening shortness of breath, or other symptoms that concern you, please return to the Emergency Department immediately.

## 2017-02-09 NOTE — ED Provider Notes (Signed)
Patient reported improvement.  She is awake alert, normal saturations on room air. Lungs with minimal end expiratory wheezing, taking good volumes, speaking in full and clear sentences.   She has nebulizer at home, will start on prednisone, azithromycin.  The patient ambulated around the emergency department, saturation 93-94% on room air and she reports asymptomatic.  Vitals:   02/09/17 0556 02/09/17 0700  BP: (!) 142/80 (!) 147/93  Pulse: 75 78  Resp: 20   Temp:       Return precautions and treatment recommendations and follow-up discussed with the patient who is agreeable with the plan.    Sharyn CreamerMark Quale, MD 02/09/17 [id number]

## 2017-02-09 NOTE — ED Notes (Signed)
ambulated patient with pulse ox. Ox sats 92-93 % with ambulation.  No SOB/ DOE.  Patient states she feels fine when walking.

## 2017-02-09 NOTE — ED Triage Notes (Addendum)
Pt ambulatory to triage. Pt has laryngitis and states I feel like I have bronchitis.  cig smoker.  Dry cough.  Pt also reports pressure after urinating.  Pt alert.

## 2017-02-09 NOTE — ED Notes (Signed)
Patient transported to X-ray 

## 2017-02-09 NOTE — ED Notes (Signed)
Pt states "when I sleep I have to wear oxygen" and "my oxygen goes down into the 80's without it." Pt states she wears 2L of oxygen. Pt requested to be put on oxygen due to wanting to try and rest. Pt placed on 2L of O2, saturation is at 97% at this time.

## 2017-02-09 NOTE — ED Notes (Signed)
Pt notified again to keep left arm straight to get IV medication. Pt verbalized understanding of this.

## 2017-02-09 NOTE — ED Notes (Signed)
AAOx3.  Skin warm and dry.  NAD 

## 2017-02-09 NOTE — ED Provider Notes (Signed)
[facility] Emergency Department Provider Note   ____________________________________________   First MD Initiated Contact with Patient 02/09/17 [phone]     (approximate)  I have reviewed the triage vital signs and the nursing notes.   HISTORY  Chief Complaint Sore Throat and Hematuria    HPI Alyssa [name] is a 41 y.o. female who comes into the hospital today with some congestion, throat pain and cough. She reports that she's been having symptoms for the past 3 days. She reports that she also has some bumps on her tongue. She has not been using any medicine because she ran out of her Medicaid. She reports that ever when she's been around has been sick. The patient is dizzy and lightheaded with some shortness of breath. She's been nauseous with no vomiting. The patient has a wet sounding cough that is not productive. She decided to come into the hospital today for evaluation.   Past Medical History:  Diagnosis Date  . Active smoker   . Anxiety   . Asthma   . Bipolar affective (HCC)   . Bipolar disorder (HCC)   . Chronic back pain   . COPD (chronic obstructive pulmonary disease) (HCC)   . DJD (degenerative joint disease)   . Fibromyalgia   . HLD (hyperlipidemia)   . HTN (hypertension)    ACEI cough   . Hypertension   . Low back pain    2 ruptured discs in lower back   . LVH (left ventricular hypertrophy)    echo (7/11) showed EF 70%, mild LVH, no sig valvular disease, moderate pericardial effussion. echo (9/11) showed no pericardial effusion, RF 65%, mild MR, normal RV size and systolic function.   . Osteoporosis   . Sleep apnea     Patient Active Problem List   Diagnosis Date Noted  . Bipolar affect, depressed (HCC) 06/20/2014  . Drug overdose 06/17/2014  . Overdose 06/17/2014  . TOBACCO ABUSE 09/07/2010  . SLEEP APNEA 09/07/2010  . COUGH 09/07/2010  . HYPERLIPIDEMIA 08/09/2010  . HYPERTENSION 08/09/2010  . ASTHMA UNSPECIFIED WITH  EXACERBATION 08/09/2010  . Shortness of breath 08/09/2010    Past Surgical History:  Procedure Laterality Date  . bilateral hand surgery    . CARPAL TUNNEL RELEASE    . CESAREAN SECTION     x3   . CESAREAN SECTION    . NOSE SURGERY    . TUBAL LIGATION    . tubligation      Prior to Admission medications   Medication Sig Start Date End Date Taking? Authorizing Provider  albuterol (PROVENTIL HFA;VENTOLIN HFA) 108 (90 Base) MCG/ACT inhaler Inhale 2 puffs into the lungs every 6 (six) hours as needed for wheezing or shortness of breath. 02/09/17   Rebecka Apley, MD  albuterol (PROVENTIL) (2.5 MG/3ML) 0.083% nebulizer solution Take 3 mLs (2.5 mg total) by nebulization every 4 (four) hours as needed for wheezing or shortness of breath. Patient not taking: Reported on 02/09/2017 06/30/14   Sanjuana Kava, NP  ARIPiprazole (ABILIFY) 5 MG tablet Take 1 tablet (5 mg total) by mouth daily. For mood control Patient not taking: Reported on 04/01/2016 06/30/14   Sanjuana Kava, NP  azithromycin (ZITHROMAX Z-PAK) 250 MG tablet Take 2 tablets (500 mg) on  Day 1,  followed by 1 tablet (250 mg) once daily on Days 2 through 5. 02/09/17 02/14/17  Rebecka Apley, MD  clonazePAM (KLONOPIN) 0.5 MG tablet Take 1 tablet (0.5 mg total) by mouth 3 (  three) times daily as needed (anxiety). Patient not taking: Reported on 02/16/2015 06/30/14   Sanjuana Kava, NP  clonazePAM (KLONOPIN) 0.5 MG tablet Take 1 tablet (0.5 mg total) by mouth 2 (two) times daily. Patient not taking: Reported on 04/01/2016 06/30/14   Sanjuana Kava, NP  diazepam (VALIUM) 5 MG tablet Take 1 tablet (5 mg total) by mouth 2 (two) times daily. Patient not taking: Reported on 04/01/2016 07/08/15   Everlene Farrier, PA-C  esomeprazole (NEXIUM) 40 MG capsule Take 1 capsule (40 mg total) by mouth every morning. For acid reflux Patient not taking: Reported on 05/23/2016 06/30/14   Sanjuana Kava, NP  Fluticasone-Salmeterol (ADVAIR DISKUS) 250-50 MCG/DOSE AEPB Inhale 2  puffs into the lungs 2 (two) times daily as needed (wheezing, SOB). Patient not taking: Reported on 02/09/2017 06/30/14   Sanjuana Kava, NP  gabapentin (NEURONTIN) 300 MG capsule Take 2 capsules (600 mg total) by mouth 3 (three) times daily. For agitation/pain Patient not taking: Reported on 02/09/2017 06/30/14   Sanjuana Kava, NP  lamoTRIgine (LAMICTAL) 25 MG tablet Take 1 tablet (25 mg total) by mouth daily. For mood stabilization Patient not taking: Reported on 02/09/2017 06/30/14   Sanjuana Kava, NP  lithium carbonate 300 MG capsule Take 1 capsule (300 mg total) by mouth 3 (three) times daily with meals. Mood stabilization Patient not taking: Reported on 04/01/2016 06/30/14   Sanjuana Kava, NP  oxyCODONE-acetaminophen (PERCOCET/ROXICET) 5-325 MG tablet Take 1-2 tablets by mouth every 4 (four) hours as needed for severe pain. Patient not taking: Reported on 02/09/2017 05/25/16   Elpidio Anis, PA-C  potassium chloride (K-DUR) 10 MEQ tablet Take 1 tablet (10 mEq total) by mouth every morning. For low potassium Patient not taking: Reported on 04/01/2016 06/30/14   Sanjuana Kava, NP  predniSONE (DELTASONE) 20 MG tablet Take 2 tablets (40 mg total) by mouth daily. Patient not taking: Reported on 05/23/2016 04/01/16   Ace Gins Sam, PA-C  predniSONE (DELTASONE) 20 MG tablet Take 3 tablets (60 mg total) by mouth daily. 02/09/17   Rebecka Apley, MD  sertraline (ZOLOFT) 100 MG tablet Take 1 tablet (100 mg total) by mouth daily. For depression Patient not taking: Reported on 04/01/2016 06/30/14   Sanjuana Kava, NP    Allergies Patient has no known allergies.  Family History  Problem Relation Age of Onset  . Coronary artery disease      family hx    Social History Social History  Substance Use Topics  . Smoking status: Current Every Day Smoker    Packs/day: 1.00    Types: Cigarettes  . Smokeless tobacco: Never Used  . Alcohol use Yes     Comment: Twice a month.     Review of Systems Constitutional: No  fever/chills Eyes: No visual changes. ENT: No sore throat. Cardiovascular:  chest pain. Respiratory:  Cough and shortness of breath. Gastrointestinal: No abdominal pain.  No nausea, no vomiting.  No diarrhea.  No constipation. Genitourinary: Negative for dysuria. Musculoskeletal: Negative for back pain. Skin: Negative for rash. Neurological: Negative for headaches, focal weakness or numbness.  10-point ROS otherwise negative.  ____________________________________________   PHYSICAL EXAM:  VITAL SIGNS: ED Triage Vitals  Enc Vitals Group     BP 02/09/17 0225 (!) 167/97     Pulse Rate 02/09/17 0225 91     Resp 02/09/17 0225 20     Temp 02/09/17 0225 98.1 F (36.7 C)     Temp Source 02/09/17  0225 Oral     SpO2 02/09/17 0225 98 %     Weight 02/09/17 0225 295 lb (133.8 kg)     Height 02/09/17 0225 5\' 6"  (1.676 m)     Head Circumference --      Peak Flow --      Pain Score 02/09/17 0226 8     Pain Loc --      Pain Edu? --      Excl. in GC? --     Constitutional: Alert and oriented. Well appearing and in moderate distress. Eyes: Conjunctivae are normal. PERRL. EOMI. Head: Atraumatic. Nose: No congestion/rhinnorhea. Mouth/Throat: Mucous membranes are moist.  Oropharynx non-erythematous. Cardiovascular: Normal rate, regular rhythm. Grossly normal heart sounds.  Good peripheral circulation. Respiratory: Normal respiratory effort.  No retractions.wheezes in all lung fields Gastrointestinal: Soft and nontender. No distention. Positive bowel sounds Musculoskeletal: No lower extremity tenderness nor edema.   Neurologic:  Normal speech and language.  Skin:  Skin is warm, dry and intact.  Psychiatric: Mood and affect are normal.   ____________________________________________   LABS (all labs ordered are listed, but only abnormal results are displayed)  Labs Reviewed  URINALYSIS, COMPLETE (UACMP) WITH MICROSCOPIC - Abnormal; Notable for the following:       Result Value    Color, Urine YELLOW (*)    APPearance CLEAR (*)    Hgb urine dipstick SMALL (*)    Squamous Epithelial / LPF 0-5 (*)    All other components within normal limits  CBC - Abnormal; Notable for the following:    Hemoglobin 11.1 (*)    HCT 33.9 (*)    MCV 72.5 (*)    MCH 23.7 (*)    RDW 18.5 (*)    All other components within normal limits  BASIC METABOLIC PANEL - Abnormal; Notable for the following:    Glucose, Bld 112 (*)    All other components within normal limits  TROPONIN I  POCT RAPID STREP A  POCT PREGNANCY, URINE   ____________________________________________  EKG  none ____________________________________________  RADIOLOGY  CXR ____________________________________________   PROCEDURES  Procedure(s) performed: None  Procedures  Critical Care performed: No  ____________________________________________   INITIAL IMPRESSION / ASSESSMENT AND PLAN / ED COURSE  Pertinent labs & imaging results that were available during my care of the patient were reviewed by me and considered in my medical decision making (see chart for details).  This is a 41 year old female with a history of COPD who comes into the hospital today with cough, shortness of breath and chest tightness. I will give the patient 2 nebs as well as Solu-Medrol and magnesium sulfate. I will check some blood work and a chest x-ray and I will reassess the patient when she's receive her medications.  Clinical Course as of Feb 09 746  Thu Feb 09, 2017  0708 No acute cardiopulmonary process seen. DG Chest 2 View [AW]    Clinical Course User Index [AW] Rebecka Apley, MD    Patient still has some mild wheezing and some hypoxia while sleeping. We will have her finish her magnesium sulfate and give her azithromycin as well as 2 more nebs. If she still wheezing was still hypoxic she will be admitted. The patient's care was signed out to [facility] who will reassess the  patient. ____________________________________________   FINAL CLINICAL IMPRESSION(S) / ED DIAGNOSES  Final diagnoses:  Bronchitis      NEW MEDICATIONS STARTED DURING THIS VISIT:  New Prescriptions   ALBUTEROL (PROVENTIL HFA;VENTOLIN  HFA) 108 (90 BASE) MCG/ACT INHALER    Inhale 2 puffs into the lungs every 6 (six) hours as needed for wheezing or shortness of breath.   AZITHROMYCIN (ZITHROMAX Z-PAK) 250 MG TABLET    Take 2 tablets (500 mg) on  Day 1,  followed by 1 tablet (250 mg) once daily on Days 2 through 5.   PREDNISONE (DELTASONE) 20 MG TABLET    Take 3 tablets (60 mg total) by mouth daily.     Note:  This document was prepared using Dragon voice recognition software and may include unintentional dictation errors.    Rebecka ApleyAllison P Webster, MD 02/09/17 [phone]

## 2017-02-15 ENCOUNTER — Emergency Department: Payer: Self-pay

## 2017-02-15 ENCOUNTER — Emergency Department
Admission: EM | Admit: 2017-02-15 | Discharge: 2017-02-15 | Disposition: A | Discharge status: Home / Self Care | Payer: Self-pay | Attending: Emergency Medicine | Admitting: Emergency Medicine

## 2017-02-15 DIAGNOSIS — F17210 Nicotine dependence, cigarettes, uncomplicated: Secondary | ICD-10-CM | POA: Insufficient documentation

## 2017-02-15 DIAGNOSIS — I10 Essential (primary) hypertension: Secondary | ICD-10-CM | POA: Insufficient documentation

## 2017-02-15 DIAGNOSIS — R1013 Epigastric pain: Secondary | ICD-10-CM

## 2017-02-15 DIAGNOSIS — J45909 Unspecified asthma, uncomplicated: Secondary | ICD-10-CM | POA: Insufficient documentation

## 2017-02-15 DIAGNOSIS — R109 Unspecified abdominal pain: Secondary | ICD-10-CM

## 2017-02-15 DIAGNOSIS — K2900 Acute gastritis without bleeding: Secondary | ICD-10-CM | POA: Insufficient documentation

## 2017-02-15 DIAGNOSIS — J449 Chronic obstructive pulmonary disease, unspecified: Secondary | ICD-10-CM | POA: Insufficient documentation

## 2017-02-15 DIAGNOSIS — Z79899 Other long term (current) drug therapy: Secondary | ICD-10-CM | POA: Insufficient documentation

## 2017-02-15 LAB — COMPREHENSIVE METABOLIC PANEL
ALT: 20 U/L (ref 14–54)
ANION GAP: 7 (ref 5–15)
AST: 20 U/L (ref 15–41)
Albumin: 4 g/dL (ref 3.5–5.0)
Alkaline Phosphatase: 72 U/L (ref 38–126)
BILIRUBIN TOTAL: 0.5 mg/dL (ref 0.3–1.2)
BUN: 11 mg/dL (ref 6–20)
CO2: 30 mmol/L (ref 22–32)
Calcium: 8.9 mg/dL (ref 8.9–10.3)
Chloride: 102 mmol/L (ref 101–111)
Creatinine, Ser: 0.7 mg/dL (ref 0.44–1.00)
Glucose, Bld: 115 mg/dL — ABNORMAL HIGH (ref 65–99)
POTASSIUM: 3.6 mmol/L (ref 3.5–5.1)
Sodium: 139 mmol/L (ref 135–145)
TOTAL PROTEIN: 7.4 g/dL (ref 6.5–8.1)

## 2017-02-15 LAB — URINALYSIS, COMPLETE (UACMP) WITH MICROSCOPIC
BACTERIA UA: NONE SEEN
BILIRUBIN URINE: NEGATIVE
Glucose, UA: NEGATIVE mg/dL
HGB URINE DIPSTICK: NEGATIVE
KETONES UR: NEGATIVE mg/dL
LEUKOCYTES UA: NEGATIVE
NITRITE: NEGATIVE
Protein, ur: NEGATIVE mg/dL
RBC / HPF: NONE SEEN RBC/hpf (ref 0–5)
SPECIFIC GRAVITY, URINE: 1.018 (ref 1.005–1.030)
pH: 6 (ref 5.0–8.0)

## 2017-02-15 LAB — BLOOD GAS, VENOUS
ACID-BASE EXCESS: 6.6 mmol/L — AB (ref 0.0–2.0)
BICARBONATE: 34.3 mmol/L — AB (ref 20.0–28.0)
O2 Saturation: 64.1 %
PCO2 VEN: 65 mmHg — AB (ref 44.0–60.0)
PO2 VEN: 36 mmHg (ref 32.0–45.0)
Patient temperature: 37
pH, Ven: 7.33 (ref 7.250–7.430)

## 2017-02-15 LAB — CBC
HEMATOCRIT: 37.7 % (ref 35.0–47.0)
HEMOGLOBIN: 11.9 g/dL — AB (ref 12.0–16.0)
MCH: 23.3 pg — ABNORMAL LOW (ref 26.0–34.0)
MCHC: 31.6 g/dL — ABNORMAL LOW (ref 32.0–36.0)
MCV: 73.9 fL — ABNORMAL LOW (ref 80.0–100.0)
Platelets: 342 10*3/uL (ref 150–440)
RBC: 5.11 MIL/uL (ref 3.80–5.20)
RDW: 17.9 % — ABNORMAL HIGH (ref 11.5–14.5)
WBC: 10.9 10*3/uL (ref 3.6–11.0)

## 2017-02-15 LAB — LIPASE, BLOOD: LIPASE: 23 U/L (ref 11–51)

## 2017-02-15 LAB — TROPONIN I
Troponin I: <0.03 ng/mL (ref <0.03)
Troponin I: <0.03 ng/mL (ref <0.03)

## 2017-02-15 MED ORDER — MORPHINE SULFATE (PF) 4 MG/ML IV SOLN
4.0000 mg | Freq: Once | INTRAVENOUS | Status: DC
  Filled 2017-02-15: qty 1

## 2017-02-15 MED ORDER — IPRATROPIUM-ALBUTEROL 0.5-2.5 (3) MG/3ML IN SOLN
3.0000 mL | Freq: Once | RESPIRATORY_TRACT | Status: AC
  Administered 2017-02-15: 3 mL via RESPIRATORY_TRACT
  Filled 2017-02-15: qty 3

## 2017-02-15 MED ORDER — SODIUM CHLORIDE 0.9 % IV BOLUS (SEPSIS)
1000.0000 mL | Freq: Once | INTRAVENOUS | Status: AC
  Administered 2017-02-15: 1000 mL via INTRAVENOUS

## 2017-02-15 MED ORDER — SUCRALFATE 1 G PO TABS: 1.0000 g | ORAL_TABLET | Freq: Two times a day (BID) | ORAL | 0 refills | Status: DC

## 2017-02-15 MED ORDER — METHYLPREDNISOLONE SODIUM SUCC 125 MG IJ SOLR
125.0000 mg | Freq: Once | INTRAMUSCULAR | Status: AC
  Administered 2017-02-15: 125 mg via INTRAVENOUS
  Filled 2017-02-15: qty 2

## 2017-02-15 MED ORDER — FAMOTIDINE 40 MG PO TABS: 40.0000 mg | ORAL_TABLET | Freq: Every evening | ORAL | 0 refills | Status: DC

## 2017-02-15 MED ORDER — ONDANSETRON HCL 4 MG/2ML IJ SOLN
4.0000 mg | Freq: Once | INTRAMUSCULAR | Status: AC
  Administered 2017-02-15: 4 mg via INTRAVENOUS
  Filled 2017-02-15: qty 2

## 2017-02-15 MED ORDER — GI COCKTAIL ~~LOC~~: 30.0000 mL | Freq: Once | ORAL | Status: DC

## 2017-02-15 NOTE — ED Provider Notes (Signed)
[facility] Emergency Department Provider Note   ____________________________________________   First MD Initiated Contact with Patient 02/15/17 [phone]     (approximate)  I have reviewed the triage vital signs and the nursing notes.   HISTORY  Chief Complaint Abdominal Pain    HPI Alyssa [name] is a 41 y.o. female whocomes into the hospital today with some abdominal pain. The patient reports that the pain goes from her abdomen through into her back. She has had some pains in her abdomen for the past week. She also feels that when she sits up she has a knot in her upper abdomen. She reports that this evening at work she is Sam weight the pain has been bothering her ever since. She states it is a stabbing pain that she rates an 8 out of 10 in intensity. The patient does not have any nausea, vomiting, fevers. She reports the pain is even causing her to not be able to take a deep breath. The patient reports that she has a history of constipation so there is nothing new going on. The patient reports that she's had some chest pain as well as shortness of breath. She is recently getting over bronchitis. The patient is here today for evaluation.   Past Medical History:  Diagnosis Date  . Active smoker   . Anxiety   . Asthma   . Bipolar affective (HCC)   . Bipolar disorder (HCC)   . Chronic back pain   . COPD (chronic obstructive pulmonary disease) (HCC)   . DJD (degenerative joint disease)   . Fibromyalgia   . HLD (hyperlipidemia)   . HTN (hypertension)    ACEI cough   . Hypertension   . Low back pain    2 ruptured discs in lower back   . LVH (left ventricular hypertrophy)    echo (7/11) showed EF 70%, mild LVH, no sig valvular disease, moderate pericardial effussion. echo (9/11) showed no pericardial effusion, RF 65%, mild MR, normal RV size and systolic function.   . Osteoporosis   . Sleep apnea     Patient Active Problem List   Diagnosis Date Noted  .  Bipolar affect, depressed (HCC) 06/20/2014  . Drug overdose 06/17/2014  . Overdose 06/17/2014  . TOBACCO ABUSE 09/07/2010  . SLEEP APNEA 09/07/2010  . COUGH 09/07/2010  . HYPERLIPIDEMIA 08/09/2010  . HYPERTENSION 08/09/2010  . ASTHMA UNSPECIFIED WITH EXACERBATION 08/09/2010  . Shortness of breath 08/09/2010    Past Surgical History:  Procedure Laterality Date  . bilateral hand surgery    . CARPAL TUNNEL RELEASE    . CESAREAN SECTION     x3   . CESAREAN SECTION    . NOSE SURGERY    . TUBAL LIGATION    . tubligation      Prior to Admission medications   Medication Sig Start Date End Date Taking? Authorizing Provider  albuterol (PROVENTIL HFA;VENTOLIN HFA) 108 (90 Base) MCG/ACT inhaler Inhale 2 puffs into the lungs every 6 (six) hours as needed for wheezing or shortness of breath. 02/09/17   Rebecka Apley, MD  albuterol (PROVENTIL) (2.5 MG/3ML) 0.083% nebulizer solution Take 3 mLs (2.5 mg total) by nebulization every 4 (four) hours as needed for wheezing or shortness of breath. Patient not taking: Reported on 02/09/2017 06/30/14   Sanjuana Kava, NP  ARIPiprazole (ABILIFY) 5 MG tablet Take 1 tablet (5 mg total) by mouth daily. For mood control Patient not taking: Reported on 04/01/2016 06/30/14  Sanjuana Kava, NP  clonazePAM (KLONOPIN) 0.5 MG tablet Take 1 tablet (0.5 mg total) by mouth 3 (three) times daily as needed (anxiety). Patient not taking: Reported on 02/16/2015 06/30/14   Sanjuana Kava, NP  clonazePAM (KLONOPIN) 0.5 MG tablet Take 1 tablet (0.5 mg total) by mouth 2 (two) times daily. Patient not taking: Reported on 04/01/2016 06/30/14   Sanjuana Kava, NP  diazepam (VALIUM) 5 MG tablet Take 1 tablet (5 mg total) by mouth 2 (two) times daily. Patient not taking: Reported on 04/01/2016 07/08/15   Everlene Farrier, PA-C  esomeprazole (NEXIUM) 40 MG capsule Take 1 capsule (40 mg total) by mouth every morning. For acid reflux Patient not taking: Reported on 05/23/2016 06/30/14   Sanjuana Kava, NP  famotidine (PEPCID) 40 MG tablet Take 1 tablet (40 mg total) by mouth every evening. 02/15/17 02/15/18  Rebecka Apley, MD  Fluticasone-Salmeterol (ADVAIR DISKUS) 250-50 MCG/DOSE AEPB Inhale 2 puffs into the lungs 2 (two) times daily as needed (wheezing, SOB). Patient not taking: Reported on 02/09/2017 06/30/14   Sanjuana Kava, NP  gabapentin (NEURONTIN) 300 MG capsule Take 2 capsules (600 mg total) by mouth 3 (three) times daily. For agitation/pain Patient not taking: Reported on 02/09/2017 06/30/14   Sanjuana Kava, NP  lamoTRIgine (LAMICTAL) 25 MG tablet Take 1 tablet (25 mg total) by mouth daily. For mood stabilization Patient not taking: Reported on 02/09/2017 06/30/14   Sanjuana Kava, NP  lithium carbonate 300 MG capsule Take 1 capsule (300 mg total) by mouth 3 (three) times daily with meals. Mood stabilization Patient not taking: Reported on 04/01/2016 06/30/14   Sanjuana Kava, NP  oxyCODONE-acetaminophen (PERCOCET/ROXICET) 5-325 MG tablet Take 1-2 tablets by mouth every 4 (four) hours as needed for severe pain. Patient not taking: Reported on 02/09/2017 05/25/16   Elpidio Anis, PA-C  potassium chloride (K-DUR) 10 MEQ tablet Take 1 tablet (10 mEq total) by mouth every morning. For low potassium Patient not taking: Reported on 04/01/2016 06/30/14   Sanjuana Kava, NP  predniSONE (DELTASONE) 20 MG tablet Take 2 tablets (40 mg total) by mouth daily. Patient not taking: Reported on 05/23/2016 04/01/16   Ace Gins Sam, PA-C  predniSONE (DELTASONE) 20 MG tablet Take 3 tablets (60 mg total) by mouth daily. 02/09/17   Rebecka Apley, MD  sertraline (ZOLOFT) 100 MG tablet Take 1 tablet (100 mg total) by mouth daily. For depression Patient not taking: Reported on 04/01/2016 06/30/14   Sanjuana Kava, NP  sucralfate (CARAFATE) 1 g tablet Take 1 tablet (1 g total) by mouth 2 (two) times daily. 02/15/17   Rebecka Apley, MD    Allergies Patient has no known allergies.  Family History  Problem Relation Age  of Onset  . Coronary artery disease      family hx    Social History Social History  Substance Use Topics  . Smoking status: Current Every Day Smoker    Packs/day: 1.00    Types: Cigarettes  . Smokeless tobacco: Never Used  . Alcohol use Yes     Comment: Twice a month.     Review of Systems Constitutional: No fever/chills Eyes: No visual changes. ENT: No sore throat. Cardiovascular:  chest pain. Respiratory: shortness of breath. Gastrointestinal: abdominal pain.  No nausea, no vomiting.  No diarrhea.  No constipation. Genitourinary: Negative for dysuria. Musculoskeletal: Negative for back pain. Skin: Negative for rash. Neurological: Negative for headaches, focal weakness or numbness.  10-point  ROS otherwise negative.  ____________________________________________   PHYSICAL EXAM:  VITAL SIGNS: ED Triage Vitals [02/15/17 0422]  Enc Vitals Group     BP      Pulse      Resp      Temp      Temp src      SpO2      Weight 290 lb (131.5 kg)     Height 5\' 6"  (1.676 m)     Head Circumference      Peak Flow      Pain Score 8     Pain Loc      Pain Edu?      Excl. in GC?     Constitutional: Alert and oriented. Well appearing and in moderate distress. Eyes: Conjunctivae are normal. PERRL. EOMI. Head: Atraumatic. Nose: No congestion/rhinnorhea. Mouth/Throat: Mucous membranes are moist.  Oropharynx non-erythematous. Cardiovascular: Normal rate, regular rhythm. Grossly normal heart sounds.  Good peripheral circulation. Respiratory: Normal respiratory effort.  No retractions. Lungs CTAB. Gastrointestinal: Soft with epigastric tenderness to palpation. No distention. Positive bowel sounds Musculoskeletal: No lower extremity tenderness nor edema.   Neurologic:  Normal speech and language.  Skin:  Skin is warm, dry and intact.  Psychiatric: Mood and affect are normal.   ____________________________________________   LABS (all labs ordered are listed, but only abnormal  results are displayed)  Labs Reviewed  COMPREHENSIVE METABOLIC PANEL - Abnormal; Notable for the following:       Result Value   Glucose, Bld 115 (*)    All other components within normal limits  CBC - Abnormal; Notable for the following:    Hemoglobin 11.9 (*)    MCV 73.9 (*)    MCH 23.3 (*)    MCHC 31.6 (*)    RDW 17.9 (*)    All other components within normal limits  URINALYSIS, COMPLETE (UACMP) WITH MICROSCOPIC - Abnormal; Notable for the following:    Color, Urine YELLOW (*)    APPearance CLEAR (*)    Squamous Epithelial / LPF 0-5 (*)    All other components within normal limits  BLOOD GAS, VENOUS - Abnormal; Notable for the following:    pCO2, Ven 65 (*)    Bicarbonate 34.3 (*)    Acid-Base Excess 6.6 (*)    All other components within normal limits  LIPASE, BLOOD  TROPONIN I  TROPONIN I   ____________________________________________  EKG  ED ECG REPORT I, Rebecka ApleyWebster,  Allison P, the attending physician, personally viewed and interpreted this ECG.   Date: 02/15/2017  EKG Time: 551  Rate: 63  Rhythm: normal sinus rhythm  Axis: normal  Intervals:none  ST&T Change: none  ____________________________________________  RADIOLOGY  US abd RUQ ____________________________________________   PROCEDURES  Procedure(s) performed: None  Procedures  Critical Care performed: No  ____________________________________________   INITIAL IMPRESSION / ASSESSMENT AND PLAN / ED COURSE  Pertinent labs & imaging results that were available during my care of the patient were reviewed by me and considered in my medical decision making (see chart for details).  This is a 41 year old female who comes into the hospital today with some abdominal pain. The patient reports it is been bothering her on an off for the past week with the worsening today. I did check some blood work including a lipase which is unremarkable. The patient's CMP is also unremarkable. I did order some  morphine with the patient was sleeping without any difficulty. I will give the patient a GI cocktail and I will  repeat a troponin and perform a chest x-ray.  Clinical Course as of Feb 16 840  Wed Feb 15, 2017  0559 1. Contracted gallbladder. No sonographic evidence for cholelithiasis, acute cholecystitis, or biliary dilatation. 2. Mild hepatic steatosis.   US Abdomen Limited RUQ [AW]    Clinical Course User Index [AW] Rebecka Apley, MD   The patient is sleeping currently without any difficulty. We will repeat the patient's troponin to ensure that she does not have any cardiac causes of this pain. The patient did receive a DuoNeb as she did have some hypoxia while sleeping. She will be discharged to home once her troponin has returned.  ____________________________________________   FINAL CLINICAL IMPRESSION(S) / ED DIAGNOSES  Final diagnoses:  Abdominal pain  Epigastric pain  Acute gastritis without hemorrhage, unspecified gastritis type      NEW MEDICATIONS STARTED DURING THIS VISIT:  New Prescriptions   FAMOTIDINE (PEPCID) 40 MG TABLET    Take 1 tablet (40 mg total) by mouth every evening.   SUCRALFATE (CARAFATE) 1 G TABLET    Take 1 tablet (1 g total) by mouth 2 (two) times daily.     Note:  This document was prepared using Dragon voice recognition software and may include unintentional dictation errors.    Rebecka Apley, MD 02/15/17 [phone]

## 2017-02-15 NOTE — ED Triage Notes (Signed)
Pt presents to ED via POV with c/o RLQ abdominal pain with radiation into RIGHT flank. Pt also with c/o epigastric pain that feels "like I'm being stabbed with a rock". Pt denies having Gall Bladder or Appendix removed in past. Pt also denies any recent change in bladder or bowel habits; no c/o N/V.

## 2017-02-15 NOTE — ED Notes (Signed)
Patient's O2 was dropping to 83% on Ra. Checked on patient for Rn, patient was asleep, placed her on 2L .

## 2017-02-15 NOTE — ED Notes (Signed)
Pt remains too asleep to be able to arouse to give oral medication at this time.  Will continue to monitor.  Pt appears comfortable at this time.

## 2017-02-15 NOTE — ED Notes (Signed)
Attempted to give patient pain medication, upon entering room, pt asleep.  This RN made numerous attempts to wake patient, but patient not staying awake for longer than a few seconds.  Pt appears calm and relaxed at this time.  EDP notified and medication held at this time due to patient decreased alertness.  Will continue to monitor.

## 2017-02-15 NOTE — Discharge Instructions (Signed)
Please follow-up with her primary care physician. Please stay away from foods that are greasy, caffeinated, citric, spicy, tomato sauces. Please return to the emergency department with any worsening symptoms.

## 2017-08-13 ENCOUNTER — Emergency Department (HOSPITAL_COMMUNITY)
Admission: EM | Admit: 2017-08-13 | Discharge: 2017-08-14 | Disposition: A | Discharge status: Home / Self Care | Payer: Self-pay | Attending: Emergency Medicine | Admitting: Emergency Medicine

## 2017-08-13 ENCOUNTER — Encounter (HOSPITAL_COMMUNITY): Payer: Self-pay | Admitting: Emergency Medicine

## 2017-08-13 DIAGNOSIS — Z79899 Other long term (current) drug therapy: Secondary | ICD-10-CM | POA: Insufficient documentation

## 2017-08-13 DIAGNOSIS — R000 Tachycardia, unspecified: Secondary | ICD-10-CM | POA: Insufficient documentation

## 2017-08-13 DIAGNOSIS — Z8709 Personal history of other diseases of the respiratory system: Secondary | ICD-10-CM

## 2017-08-13 DIAGNOSIS — J449 Chronic obstructive pulmonary disease, unspecified: Secondary | ICD-10-CM | POA: Insufficient documentation

## 2017-08-13 DIAGNOSIS — J020 Streptococcal pharyngitis: Secondary | ICD-10-CM | POA: Insufficient documentation

## 2017-08-13 DIAGNOSIS — R05 Cough: Secondary | ICD-10-CM | POA: Insufficient documentation

## 2017-08-13 DIAGNOSIS — F17210 Nicotine dependence, cigarettes, uncomplicated: Secondary | ICD-10-CM | POA: Insufficient documentation

## 2017-08-13 DIAGNOSIS — I10 Essential (primary) hypertension: Secondary | ICD-10-CM | POA: Insufficient documentation

## 2017-08-13 LAB — RAPID STREP SCREEN (MED CTR MEBANE ONLY): STREPTOCOCCUS, GROUP A SCREEN (DIRECT): POSITIVE — AB

## 2017-08-13 MED ORDER — ACETAMINOPHEN 325 MG PO TABS
650.0000 mg | ORAL_TABLET | Freq: Once | ORAL | Status: AC | PRN
  Administered 2017-08-13: 650 mg via ORAL

## 2017-08-13 MED ORDER — ACETAMINOPHEN 325 MG PO TABS
ORAL_TABLET | ORAL | Status: AC
  Filled 2017-08-13: qty 2

## 2017-08-13 NOTE — ED Triage Notes (Signed)
Reports upper resp symptoms with sore throat for 3 days. Low grade temp noted in triage.  Also c/o generalized aches and pains all over.  Has not taken anything.

## 2017-08-14 MED ORDER — SODIUM CHLORIDE 0.9 % IV BOLUS (SEPSIS)
1000.0000 mL | Freq: Once | INTRAVENOUS | Status: AC
  Administered 2017-08-14: 1000 mL via INTRAVENOUS

## 2017-08-14 MED ORDER — ALBUTEROL SULFATE (2.5 MG/3ML) 0.083% IN NEBU
5.0000 mg | INHALATION_SOLUTION | Freq: Once | RESPIRATORY_TRACT | Status: AC
  Administered 2017-08-14: 5 mg via RESPIRATORY_TRACT
  Filled 2017-08-14: qty 6

## 2017-08-14 MED ORDER — PENICILLIN G BENZATHINE 1200000 UNIT/2ML IM SUSP
1.2000 10*6.[IU] | Freq: Once | INTRAMUSCULAR | Status: AC
  Administered 2017-08-14: 1.2 10*6.[IU] via INTRAMUSCULAR
  Filled 2017-08-14: qty 2

## 2017-08-14 MED ORDER — IBUPROFEN 400 MG PO TABS
600.0000 mg | ORAL_TABLET | Freq: Once | ORAL | Status: AC
  Administered 2017-08-14: 600 mg via ORAL
  Filled 2017-08-14: qty 1

## 2017-08-14 MED ORDER — DEXAMETHASONE 4 MG PO TABS
10.0000 mg | ORAL_TABLET | Freq: Once | ORAL | Status: AC
Start: 2017-08-14 — End: 2017-08-14
  Administered 2017-08-14: 10 mg via ORAL
  Filled 2017-08-14: qty 3

## 2017-08-14 MED ORDER — ALBUTEROL SULFATE HFA 108 (90 BASE) MCG/ACT IN AERS
1.0000 | INHALATION_SPRAY | Freq: Once | RESPIRATORY_TRACT | Status: AC
  Administered 2017-08-14: 1 via RESPIRATORY_TRACT
  Filled 2017-08-14: qty 6.7

## 2017-08-14 NOTE — Discharge Instructions (Addendum)
Please read and follow all provided instructions.  Your diagnoses today include: Strep Throat  Tests performed today include: Vital signs. See below for your results today.  Strep Test: This was positive  Medications prescribed:  You were given antibiotics and steroids in the department.   Home care instructions:  This is a bacterial infection. Continue to stay well-hydrated. Gargle warm salt water and spit it out. Continued to alternate between Tylenol and ibuprofen for pain. May consider over-the-counter Benadryl for additional relief (decrease secretions). Also discard your toothbrush and begin using a new one in 3 days. Follow-up with your primary care doctor in this week for recheck of ongoing symptoms.   Follow-up instructions: Please follow-up with your primary care provider in 2-3 days for follow up.    Return instructions:  Return to the ED sooner for worsening condition, inability to swallow, breathing difficulty, new concerns.  Additional Information:  Your vital signs today were: BP (!) 126/45 (BP Location: Right Wrist)    Pulse 83    Temp 99.8 F (37.7 C) (Oral)    Resp 16    Ht 5\' 6"  (1.676 m)    Wt 131.5 kg (290 lb)    SpO2 92%    BMI 46.81 kg/m  If your blood pressure (BP) was elevated above 135/85 this visit, please have this repeated by your doctor within one month. ---------------

## 2017-08-14 NOTE — ED Provider Notes (Signed)
[facility]-EMERGENCY DEPT Provider Note   CSN: [id number] Arrival date & time: 08/13/17  2218     History   Chief Complaint Chief Complaint  Patient presents with  . Sore Throat  . Cough    HPI Alyssa [name] is a 41 y.o. female with a history of asthma, COPD, tobacco abuse, hypertension who presents emergency department today for sore throat, cough, generalized body aches. The patient notes that over the last 3 days she has developed a sore throat that she describes as scratchy with associated dysphagia, congestion, rhinorrhea, generalized body aches, non-productive cough and low grade fever of 99.41F at home. She has taken ibuprofen for this with mild to no relief. Denies fever, chills, inability to control secretions, N/V, abdominal pain, chest pain, shortness of breath, hemoptysis, or voice change. She notes she has not been orally hydrating well over the last several days.   HPI  Past Medical History:  Diagnosis Date  . Active smoker   . Anxiety   . Asthma   . Bipolar affective (HCC)   . Bipolar disorder (HCC)   . Chronic back pain   . COPD (chronic obstructive pulmonary disease) (HCC)   . DJD (degenerative joint disease)   . Fibromyalgia   . HLD (hyperlipidemia)   . HTN (hypertension)    ACEI cough   . Hypertension   . Low back pain    2 ruptured discs in lower back   . LVH (left ventricular hypertrophy)    echo (7/11) showed EF 70%, mild LVH, no sig valvular disease, moderate pericardial effussion. echo (9/11) showed no pericardial effusion, RF 65%, mild MR, normal RV size and systolic function.   . Osteoporosis   . Sleep apnea     Patient Active Problem List   Diagnosis Date Noted  . Bipolar affect, depressed (HCC) 06/20/2014  . Drug overdose 06/17/2014  . Overdose 06/17/2014  . TOBACCO ABUSE 09/07/2010  . SLEEP APNEA 09/07/2010  . COUGH 09/07/2010  . HYPERLIPIDEMIA 08/09/2010  . HYPERTENSION 08/09/2010  . ASTHMA UNSPECIFIED WITH EXACERBATION 08/09/2010  .  Shortness of breath 08/09/2010    Past Surgical History:  Procedure Laterality Date  . bilateral hand surgery    . CARPAL TUNNEL RELEASE    . CESAREAN SECTION     x3   . CESAREAN SECTION    . NOSE SURGERY    . TUBAL LIGATION    . tubligation      OB History    Gravida Para Term Preterm AB Living   0 0 0 0 0     SAB TAB Ectopic Multiple Live Births   0 0 0           Home Medications    Prior to Admission medications   Medication Sig Start Date End Date Taking? Authorizing Provider  albuterol (PROVENTIL HFA;VENTOLIN HFA) 108 (90 Base) MCG/ACT inhaler Inhale 2 puffs into the lungs every 6 (six) hours as needed for wheezing or shortness of breath. 02/09/17   Rebecka Apley, MD  albuterol (PROVENTIL) (2.5 MG/3ML) 0.083% nebulizer solution Take 3 mLs (2.5 mg total) by nebulization every 4 (four) hours as needed for wheezing or shortness of breath. Patient not taking: Reported on 02/09/2017 06/30/14   Armandina Stammer I, NP  ARIPiprazole (ABILIFY) 5 MG tablet Take 1 tablet (5 mg total) by mouth daily. For mood control Patient not taking: Reported on 04/01/2016 06/30/14   Armandina Stammer I, NP  clonazePAM (KLONOPIN) 0.5 MG tablet Take 1 tablet (0.5  mg total) by mouth 3 (three) times daily as needed (anxiety). Patient not taking: Reported on 02/16/2015 06/30/14   Armandina Stammer I, NP  clonazePAM (KLONOPIN) 0.5 MG tablet Take 1 tablet (0.5 mg total) by mouth 2 (two) times daily. Patient not taking: Reported on 04/01/2016 06/30/14   Armandina Stammer I, NP  diazepam (VALIUM) 5 MG tablet Take 1 tablet (5 mg total) by mouth 2 (two) times daily. Patient not taking: Reported on 04/01/2016 07/08/15   Everlene Farrier, PA-C  esomeprazole (NEXIUM) 40 MG capsule Take 1 capsule (40 mg total) by mouth every morning. For acid reflux Patient not taking: Reported on 05/23/2016 06/30/14   Armandina Stammer I, NP  famotidine (PEPCID) 40 MG tablet Take 1 tablet (40 mg total) by mouth every evening. 02/15/17 02/15/18  Rebecka Apley,  MD  Fluticasone-Salmeterol (ADVAIR DISKUS) 250-50 MCG/DOSE AEPB Inhale 2 puffs into the lungs 2 (two) times daily as needed (wheezing, SOB). Patient not taking: Reported on 02/09/2017 06/30/14   Armandina Stammer I, NP  gabapentin (NEURONTIN) 300 MG capsule Take 2 capsules (600 mg total) by mouth 3 (three) times daily. For agitation/pain Patient not taking: Reported on 02/09/2017 06/30/14   Armandina Stammer I, NP  lamoTRIgine (LAMICTAL) 25 MG tablet Take 1 tablet (25 mg total) by mouth daily. For mood stabilization Patient not taking: Reported on 02/09/2017 06/30/14   Armandina Stammer I, NP  lithium carbonate 300 MG capsule Take 1 capsule (300 mg total) by mouth 3 (three) times daily with meals. Mood stabilization Patient not taking: Reported on 04/01/2016 06/30/14   Armandina Stammer I, NP  oxyCODONE-acetaminophen (PERCOCET/ROXICET) 5-325 MG tablet Take 1-2 tablets by mouth every 4 (four) hours as needed for severe pain. Patient not taking: Reported on 02/09/2017 05/25/16   Elpidio Anis, PA-C  potassium chloride (K-DUR) 10 MEQ tablet Take 1 tablet (10 mEq total) by mouth every morning. For low potassium Patient not taking: Reported on 04/01/2016 06/30/14   Armandina Stammer I, NP  predniSONE (DELTASONE) 20 MG tablet Take 2 tablets (40 mg total) by mouth daily. Patient not taking: Reported on 05/23/2016 04/01/16   Sam, Ace Gins, PA-C  predniSONE (DELTASONE) 20 MG tablet Take 3 tablets (60 mg total) by mouth daily. 02/09/17   Rebecka Apley, MD  sertraline (ZOLOFT) 100 MG tablet Take 1 tablet (100 mg total) by mouth daily. For depression Patient not taking: Reported on 04/01/2016 06/30/14   Armandina Stammer I, NP  sucralfate (CARAFATE) 1 g tablet Take 1 tablet (1 g total) by mouth 2 (two) times daily. 02/15/17   Rebecka Apley, MD    Family History Family History  Problem Relation Age of Onset  . Coronary artery disease Unknown        family hx    Social History Social History  Substance Use Topics  . Smoking status: Current  Every Day Smoker    Packs/day: 1.00    Types: Cigarettes  . Smokeless tobacco: Never Used  . Alcohol use Yes     Comment: Twice a month.      Allergies   Patient has no known allergies.   Review of Systems Review of Systems  All other systems reviewed and are negative.    Physical Exam Updated Vital Signs BP 104/88 (BP Location: Right Arm)   Pulse (!) 108   Temp 99.8 F (37.7 C) (Oral)   Resp 18   Ht 5\' 6"  (1.676 m)   Wt 131.5 kg (290 lb)   SpO2 99%  BMI 46.81 kg/m   Physical Exam  Constitutional: She appears well-developed and well-nourished.  HENT:  Head: Normocephalic and atraumatic.  Right Ear: Tympanic membrane, external ear and ear canal normal.  Left Ear: Tympanic membrane, external ear and ear canal normal.  Nose: Mucosal edema present. Right sinus exhibits no maxillary sinus tenderness and no frontal sinus tenderness. Left sinus exhibits no maxillary sinus tenderness and no frontal sinus tenderness.  Mouth/Throat: Uvula is midline, oropharynx is clear and moist and mucous membranes are normal. No tonsillar exudate.  Eyes: Pupils are equal, round, and reactive to light. Right eye exhibits no discharge. Left eye exhibits no discharge. No scleral icterus.  Neck: Trachea normal and normal range of motion. Neck supple. No spinous process tenderness present. No neck rigidity. Normal range of motion present.  The patient has normal phonation and is in control of secretions. No stridor.  Midline uvula without edema. Soft palate rises symmetrically. Mild tonsillar erythema and few exudates. No PTA. Tongue protrusion is normal. No trismus. No creptius on neck palpation and patient has good dentition. No gingival erythema or fluctuance noted. Mucus membranes moist.   Cardiovascular: Regular rhythm and intact distal pulses.  Tachycardia present.   No murmur heard. Pulses:      Radial pulses are 2+ on the right side, and 2+ on the left side.       Dorsalis pedis pulses are  2+ on the right side, and 2+ on the left side.       Posterior tibial pulses are 2+ on the right side, and 2+ on the left side.  No lower extremity swelling or edema. Calves symmetric in size bilaterally.  Pulmonary/Chest: Effort normal. No tachypnea. No respiratory distress. She has wheezes. She exhibits no tenderness.  Abdominal: Soft. Bowel sounds are normal. There is no tenderness. There is no rebound and no guarding.  Musculoskeletal: She exhibits no edema.  Lymphadenopathy:    She has no cervical adenopathy.  Neurological: She is alert.  Skin: Skin is warm and dry. No rash noted. She is not diaphoretic.  Psychiatric: She has a normal mood and affect.  Nursing note and vitals reviewed.    ED Treatments / Results  Labs (all labs ordered are listed, but only abnormal results are displayed) Labs Reviewed  RAPID STREP SCREEN (NOT AT [facility]) - Abnormal; Notable for the following:       Result Value   Streptococcus, Group A Screen (Direct) POSITIVE (*)    All other components within normal limits    EKG  EKG Interpretation None       Radiology No results found.  Procedures Procedures (including critical care time)  Medications Ordered in ED Medications  acetaminophen (TYLENOL) 325 MG tablet (not administered)  albuterol (PROVENTIL) (2.5 MG/3ML) 0.083% nebulizer solution 5 mg (not administered)  ibuprofen (ADVIL,MOTRIN) tablet 600 mg (not administered)  acetaminophen (TYLENOL) tablet 650 mg (650 mg Oral Given 08/13/17 2247)     Initial Impression / Assessment and Plan / ED Course  I have reviewed the triage vital signs and the nursing notes.  Pertinent labs & imaging results that were available during my care of the patient were reviewed by me and considered in my medical decision making (see chart for details).     41 year old female presenting for sore throat, generalized body aches, nonproductive cough, congestion over the last 3 days. Patient with positive strep  test in triage. Pt with low fever in traige and with tonsillar exudate, cervical lymphadenopathy & dysphagia; diagnosis  of bacterial pharyngitis. Treated in the ED with steroids, NSAIDs, pain medication and PCN IM.  Pt appears mildly dehydrated and slightly tachycardic. IVF given. Discussed importance of water rehydration.  Presentation non concerning for PTA or RPA. No trismus or uvula deviation. Specific return precautions discussed. Patient with wheezing on exam. Sating 99% on RA. No respiratory distress. History of COPD and Asthma. No inhaler use today. Patient states she is out of her inhaler. Breathing treatment given. Repeat exam without wheezing or adventitious breath sounds. Will provide patient with inhaler. Patient tachycardia resolved. Pt able to drink water in ED without difficulty with intact air way. Return precautions discussed. Recommended PCP follow up this week.   Final Clinical Impressions(s) / ED Diagnoses   Final diagnoses:  Strep pharyngitis  History of COPD    New Prescriptions New Prescriptions   No medications on file     Princella PellegriniMaczis, Naya Ilagan M, PA-C 08/14/17 [id number]    Ray, Danielle, MD 08/14/17 [phone]

## 2017-11-29 ENCOUNTER — Encounter: Payer: Self-pay | Admitting: Emergency Medicine

## 2017-11-29 ENCOUNTER — Emergency Department: Payer: Self-pay

## 2017-11-29 ENCOUNTER — Emergency Department
Admission: EM | Admit: 2017-11-29 | Discharge: 2017-11-29 | Disposition: A | Discharge status: Home / Self Care | Payer: Self-pay | Attending: Emergency Medicine | Admitting: Emergency Medicine

## 2017-11-29 DIAGNOSIS — G8929 Other chronic pain: Secondary | ICD-10-CM | POA: Insufficient documentation

## 2017-11-29 DIAGNOSIS — Z79899 Other long term (current) drug therapy: Secondary | ICD-10-CM | POA: Insufficient documentation

## 2017-11-29 DIAGNOSIS — J449 Chronic obstructive pulmonary disease, unspecified: Secondary | ICD-10-CM | POA: Insufficient documentation

## 2017-11-29 DIAGNOSIS — I10 Essential (primary) hypertension: Secondary | ICD-10-CM | POA: Insufficient documentation

## 2017-11-29 DIAGNOSIS — K047 Periapical abscess without sinus: Secondary | ICD-10-CM | POA: Insufficient documentation

## 2017-11-29 DIAGNOSIS — J45909 Unspecified asthma, uncomplicated: Secondary | ICD-10-CM | POA: Insufficient documentation

## 2017-11-29 DIAGNOSIS — F17210 Nicotine dependence, cigarettes, uncomplicated: Secondary | ICD-10-CM | POA: Insufficient documentation

## 2017-11-29 LAB — CBC WITH DIFFERENTIAL/PLATELET
BASOS ABS: 0 10*3/uL (ref 0–0.1)
Basophils Relative: 0 %
EOS PCT: 2 %
Eosinophils Absolute: 0.1 10*3/uL (ref 0–0.7)
HCT: 39.9 % (ref 35.0–47.0)
HEMOGLOBIN: 12.1 g/dL (ref 12.0–16.0)
LYMPHS ABS: 2 10*3/uL (ref 1.0–3.6)
LYMPHS PCT: 21 %
MCH: 21.8 pg — AB (ref 26.0–34.0)
MCHC: 30.2 g/dL — ABNORMAL LOW (ref 32.0–36.0)
MCV: 72.2 fL — AB (ref 80.0–100.0)
Monocytes Absolute: 0.6 10*3/uL (ref 0.2–0.9)
Monocytes Relative: 6 %
NEUTROS ABS: 6.8 10*3/uL — AB (ref 1.4–6.5)
NEUTROS PCT: 71 %
PLATELETS: 275 10*3/uL (ref 150–440)
RBC: 5.52 MIL/uL — AB (ref 3.80–5.20)
RDW: 18 % — ABNORMAL HIGH (ref 11.5–14.5)
WBC: 9.5 10*3/uL (ref 3.6–11.0)

## 2017-11-29 LAB — BASIC METABOLIC PANEL
Anion gap: 2 — ABNORMAL LOW (ref 5–15)
BUN: 8 mg/dL (ref 6–20)
CHLORIDE: 106 mmol/L (ref 101–111)
CO2: 28 mmol/L (ref 22–32)
Calcium: 9 mg/dL (ref 8.9–10.3)
Creatinine, Ser: 0.89 mg/dL (ref 0.44–1.00)
GFR calc Af Amer: >60 mL/min (ref >60)
GLUCOSE: 96 mg/dL (ref 65–99)
POTASSIUM: 3.6 mmol/L (ref 3.5–5.1)
SODIUM: 136 mmol/L (ref 135–145)

## 2017-11-29 LAB — POCT PREGNANCY, URINE: PREG TEST UR: NEGATIVE

## 2017-11-29 MED ORDER — LIDOCAINE-EPINEPHRINE (PF) 1 %-1:200000 IJ SOLN
INTRAMUSCULAR | Status: AC
  Administered 2017-11-29: 30 mL
  Filled 2017-11-29: qty 30

## 2017-11-29 MED ORDER — PENICILLIN V POTASSIUM 250 MG PO TABS
500.0000 mg | ORAL_TABLET | Freq: Once | ORAL | Status: AC
  Administered 2017-11-29: 500 mg via ORAL
  Filled 2017-11-29: qty 2

## 2017-11-29 MED ORDER — OXYCODONE-ACETAMINOPHEN 5-325 MG PO TABS
1.0000 | ORAL_TABLET | Freq: Once | ORAL | Status: AC
  Administered 2017-11-29: 1 via ORAL
  Filled 2017-11-29: qty 1

## 2017-11-29 MED ORDER — OXYCODONE-ACETAMINOPHEN 5-325 MG PO TABS: 1.0000 | ORAL_TABLET | ORAL | 0 refills | Status: DC | PRN

## 2017-11-29 MED ORDER — PENICILLIN V POTASSIUM 500 MG PO TABS: 500.0000 mg | ORAL_TABLET | Freq: Four times a day (QID) | ORAL | 0 refills | Status: DC

## 2017-11-29 NOTE — ED Notes (Addendum)
Pt states cold symptoms, been taking nyquil for it. Pt states R upper tooth broken, now with possible abscess. R side of face is red and swollen compared to L. Pt talking in complete sentences. Tearful. No respiratory distress noted. Hx COPD. Has been taking ibuprofen at home. Denies seeing dentist, states tooth has been broken "for years". Denies fever, N&V, diarrhea.

## 2017-11-29 NOTE — ED Provider Notes (Signed)
[facility] Emergency Department Provider Note  ____________________________________________   First MD Initiated Contact with Patient 11/29/17 2020     (approximate)  I have reviewed the triage vital signs and the nursing notes.   HISTORY  Chief Complaint Abscess   HPI Alyssa [name] is a 41 y.o. female with a history of bipolar disorder as well as cigarette smoking who is presenting to the emergency department today with right facial swelling over the past 3 days.  She says that she has had a tooth that is been chronically decaying and points to 1 of her right maxillary molars.  However, she says that she has not had facial swelling similar to this in the past.  She says that she has been trying ibuprofen at home for pain control without relief.  Past Medical History:  Diagnosis Date  . Active smoker   . Anxiety   . Asthma   . Bipolar affective (HCC)   . Bipolar disorder (HCC)   . Chronic back pain   . COPD (chronic obstructive pulmonary disease) (HCC)   . DJD (degenerative joint disease)   . Fibromyalgia   . HLD (hyperlipidemia)   . HTN (hypertension)    ACEI cough   . Hypertension   . Low back pain    2 ruptured discs in lower back   . LVH (left ventricular hypertrophy)    echo (7/11) showed EF 70%, mild LVH, no sig valvular disease, moderate pericardial effussion. echo (9/11) showed no pericardial effusion, RF 65%, mild MR, normal RV size and systolic function.   . Osteoporosis   . Sleep apnea     Patient Active Problem List   Diagnosis Date Noted  . Bipolar affect, depressed (HCC) 06/20/2014  . Drug overdose 06/17/2014  . Overdose 06/17/2014  . TOBACCO ABUSE 09/07/2010  . SLEEP APNEA 09/07/2010  . COUGH 09/07/2010  . HYPERLIPIDEMIA 08/09/2010  . HYPERTENSION 08/09/2010  . ASTHMA UNSPECIFIED WITH EXACERBATION 08/09/2010  . Shortness of breath 08/09/2010    Past Surgical History:  Procedure Laterality Date  . bilateral hand surgery     . CARPAL TUNNEL RELEASE    . CESAREAN SECTION     x3   . CESAREAN SECTION    . NOSE SURGERY    . TUBAL LIGATION    . tubligation      Prior to Admission medications   Medication Sig Start Date End Date Taking? Authorizing Provider  albuterol (PROVENTIL HFA;VENTOLIN HFA) 108 (90 Base) MCG/ACT inhaler Inhale 2 puffs into the lungs every 6 (six) hours as needed for wheezing or shortness of breath. 02/09/17   Rebecka ApleyWebster, Allison P, MD  albuterol (PROVENTIL) (2.5 MG/3ML) 0.083% nebulizer solution Take 3 mLs (2.5 mg total) by nebulization every 4 (four) hours as needed for wheezing or shortness of breath. Patient not taking: Reported on 02/09/2017 06/30/14   Armandina StammerNwoko, Agnes I, NP  ARIPiprazole (ABILIFY) 5 MG tablet Take 1 tablet (5 mg total) by mouth daily. For mood control Patient not taking: Reported on 04/01/2016 06/30/14   Armandina StammerNwoko, Agnes I, NP  clonazePAM (KLONOPIN) 0.5 MG tablet Take 1 tablet (0.5 mg total) by mouth 3 (three) times daily as needed (anxiety). Patient not taking: Reported on 02/16/2015 06/30/14   Armandina StammerNwoko, Agnes I, NP  clonazePAM (KLONOPIN) 0.5 MG tablet Take 1 tablet (0.5 mg total) by mouth 2 (two) times daily. Patient not taking: Reported on 04/01/2016 06/30/14   Armandina StammerNwoko, Agnes I, NP  diazepam (VALIUM) 5 MG tablet Take 1  tablet (5 mg total) by mouth 2 (two) times daily. Patient not taking: Reported on 04/01/2016 07/08/15   Everlene Farrieransie, William, PA-C  esomeprazole (NEXIUM) 40 MG capsule Take 1 capsule (40 mg total) by mouth every morning. For acid reflux Patient not taking: Reported on 05/23/2016 06/30/14   Armandina StammerNwoko, Agnes I, NP  famotidine (PEPCID) 40 MG tablet Take 1 tablet (40 mg total) by mouth every evening. 02/15/17 02/15/18  Rebecka ApleyWebster, Allison P, MD  Fluticasone-Salmeterol (ADVAIR DISKUS) 250-50 MCG/DOSE AEPB Inhale 2 puffs into the lungs 2 (two) times daily as needed (wheezing, SOB). Patient not taking: Reported on 02/09/2017 06/30/14   Armandina StammerNwoko, Agnes I, NP  gabapentin (NEURONTIN) 300 MG capsule Take 2  capsules (600 mg total) by mouth 3 (three) times daily. For agitation/pain Patient not taking: Reported on 02/09/2017 06/30/14   Armandina StammerNwoko, Agnes I, NP  lamoTRIgine (LAMICTAL) 25 MG tablet Take 1 tablet (25 mg total) by mouth daily. For mood stabilization Patient not taking: Reported on 02/09/2017 06/30/14   Armandina StammerNwoko, Agnes I, NP  lithium carbonate 300 MG capsule Take 1 capsule (300 mg total) by mouth 3 (three) times daily with meals. Mood stabilization Patient not taking: Reported on 04/01/2016 06/30/14   Armandina StammerNwoko, Agnes I, NP  oxyCODONE-acetaminophen (PERCOCET/ROXICET) 5-325 MG tablet Take 1-2 tablets by mouth every 4 (four) hours as needed for severe pain. Patient not taking: Reported on 02/09/2017 05/25/16   Elpidio AnisUpstill, Shari, PA-C  potassium chloride (K-DUR) 10 MEQ tablet Take 1 tablet (10 mEq total) by mouth every morning. For low potassium Patient not taking: Reported on 04/01/2016 06/30/14   Armandina StammerNwoko, Agnes I, NP  predniSONE (DELTASONE) 20 MG tablet Take 2 tablets (40 mg total) by mouth daily. Patient not taking: Reported on 05/23/2016 04/01/16   Sam, Ace GinsSerena Y, PA-C  predniSONE (DELTASONE) 20 MG tablet Take 3 tablets (60 mg total) by mouth daily. 02/09/17   Rebecka ApleyWebster, Allison P, MD  sertraline (ZOLOFT) 100 MG tablet Take 1 tablet (100 mg total) by mouth daily. For depression Patient not taking: Reported on 04/01/2016 06/30/14   Armandina StammerNwoko, Agnes I, NP  sucralfate (CARAFATE) 1 g tablet Take 1 tablet (1 g total) by mouth 2 (two) times daily. 02/15/17   Rebecka ApleyWebster, Allison P, MD    Allergies Patient has no known allergies.  Family History  Problem Relation Age of Onset  . Coronary artery disease Unknown        family hx    Social History Social History   Tobacco Use  . Smoking status: Current Every Day Smoker    Packs/day: 1.00    Types: Cigarettes  . Smokeless tobacco: Never Used  Substance Use Topics  . Alcohol use: Yes    Comment: Twice a month.   . Drug use: No    Comment: used to use cocaine - last use 10/10     Review of Systems  Constitutional: No fever/chills Eyes: No visual changes. ENT: No sore throat. Cardiovascular: Denies chest pain. Respiratory: Denies shortness of breath. Gastrointestinal: No abdominal pain.  No nausea, no vomiting.  No diarrhea.  No constipation. Genitourinary: Negative for dysuria. Musculoskeletal: Negative for back pain. Skin: Negative for rash. Neurological: Negative for headaches, focal weakness or numbness.   ____________________________________________   PHYSICAL EXAM:  VITAL SIGNS: ED Triage Vitals  Enc Vitals Group     BP 11/29/17 1918 (!) 147/105     Pulse Rate 11/29/17 1918 (!) 115     Resp 11/29/17 2022 18     Temp 11/29/17 1918 98.8 F (  37.1 C)     Temp Source 11/29/17 1918 Oral     SpO2 11/29/17 1918 99 %     Weight 11/29/17 1917 290 lb (131.5 kg)     Height --      Head Circumference --      Peak Flow --      Pain Score 11/29/17 2022 8     Pain Loc --      Pain Edu? --      Excl. in GC? --     Constitutional: Alert and oriented. Well appearing and in no acute distress. Eyes: Conjunctivae are normal.  Head: Atraumatic. Nose: No congestion/rhinnorhea. Mouth/Throat: Patient with swelling grossly to the right cheek and extending up to the right eye but without severe swelling of the right eye and the patient is able to open the right eye without issue.  Tooth #4 with severe erosion with swelling of the gumline that is fluctuant and mild.  Swollen area about 1 x 2 cm.  No trismus.  Patient with normal voice.  Controlling secretions.  Neck: No stridor.   Cardiovascular: Normal rate, regular rhythm. Grossly normal heart sounds.  Good peripheral circulation. Respiratory: Normal respiratory effort.  No retractions. Lungs CTAB. Gastrointestinal: Soft and nontender. No distention. Musculoskeletal: No lower extremity tenderness nor edema.  No joint effusions. Neurologic:  Normal speech and language. No gross focal neurologic deficits are  appreciated. Skin:  Skin is warm, dry and intact. No rash noted. Psychiatric: Mood and affect are normal. Speech and behavior are normal.  ____________________________________________   LABS (all labs ordered are listed, but only abnormal results are displayed)  Labs Reviewed  CBC WITH DIFFERENTIAL/PLATELET - Abnormal; Notable for the following components:      Result Value   RBC 5.52 (*)    MCV 72.2 (*)    MCH 21.8 (*)    MCHC 30.2 (*)    RDW 18.0 (*)    Neutro Abs 6.8 (*)    All other components within normal limits  BASIC METABOLIC PANEL - Abnormal; Notable for the following components:   Anion gap 2 (*)    All other components within normal limits  POC URINE PREG, ED  POCT PREGNANCY, URINE   ____________________________________________  EKG   ____________________________________________  RADIOLOGY  No acute finding on the chest x-ray ____________________________________________   PROCEDURES  Procedure(s) performed:    Marland KitchenMarland KitchenIncision and Drainage Date/Time: 11/29/2017 8:54 PM Performed by: Myrna Blazer, MD Authorized by: Myrna Blazer, MD   Consent:    Consent obtained:  Verbal   Consent given by:  Patient   Risks discussed:  Bleeding, incomplete drainage, pain, infection and damage to other organs   Alternatives discussed:  No treatment Location:    Type:  Abscess   Size:  1x2cm   Location:  Mouth   Mouth location:  Alveolar process Anesthesia (see MAR for exact dosages):    Anesthesia method:  Local infiltration   Local anesthetic:  Lidocaine 1% WITH epi Procedure type:    Complexity:  Simple Procedure details:    Needle aspiration: no     Incision types:  Stab incision   Scalpel blade:  11   Drainage:  Bloody and purulent   Drainage amount:  Moderate   Wound treatment:  Wound left open   Packing materials:  None Post-procedure details:    Patient tolerance of procedure:  Tolerated well, no immediate  complications Comments:     Bleeding controlled s/p procedure.  Critical Care performed:   ____________________________________________   INITIAL IMPRESSION / ASSESSMENT AND PLAN / ED COURSE  Pertinent labs & imaging results that were available during my care of the patient were reviewed by me and considered in my medical decision making (see chart for details).  DDX: Dental caries, dental erosion, dental abscess As part of my medical decision making, I reviewed the following data within the electronic MEDICAL RECORD NUMBER Notes from prior ED visits  ----------------------------------------- 8:55 PM on 11/29/2017 -----------------------------------------  Purulent and bloody drainage obtained.  Patient knows that she must follow-up with dental and was call for an appointment first thing in the morning.  She will be given a list of local dental clinics that she may call.  She will be discharged with penicillin as well as Percocet.  She is understanding of the plan for discharge and willing to comply.  She also knows that the treatment that she received today is not curative and that she must follow-up with dental for definitive treatment.  She is understanding of this diagnosis the plan willing to comply.   Heart rate of 86 upon completion of the procedure.  ____________________________________________   FINAL CLINICAL IMPRESSION(S) / ED DIAGNOSES  Dental abscess.    NEW MEDICATIONS STARTED DURING THIS VISIT:  This SmartLink is deprecated. Use AVSMEDLIST instead to display the medication list for a patient.   Note:  This document was prepared using Dragon voice recognition software and may include unintentional dictation errors.     Myrna Blazer, MD 11/29/17 2056

## 2017-11-29 NOTE — ED Triage Notes (Signed)
Pt c/o cough and chest congestion x1 week, unrelieved by OTC meds. Pt has hx/o COPD. Pt also c/o abscess from broken tooth on the right upper back side of mouth causing facial swelling and redness as well as bruising under right eye. Pt sts these symptoms started on Sunday.

## 2018-01-04 ENCOUNTER — Other Ambulatory Visit: Payer: Self-pay

## 2018-01-04 ENCOUNTER — Encounter: Payer: Self-pay | Admitting: Internal Medicine

## 2018-01-04 ENCOUNTER — Ambulatory Visit: Payer: Self-pay | Admitting: Internal Medicine

## 2018-01-04 VITALS — BP 128/82 | HR 89 | Temp 98.2°F | Ht 66.0 in | Wt 282.7 lb

## 2018-01-04 DIAGNOSIS — J45909 Unspecified asthma, uncomplicated: Secondary | ICD-10-CM

## 2018-01-04 DIAGNOSIS — F3130 Bipolar disorder, current episode depressed, mild or moderate severity, unspecified: Secondary | ICD-10-CM

## 2018-01-04 DIAGNOSIS — K219 Gastro-esophageal reflux disease without esophagitis: Secondary | ICD-10-CM

## 2018-01-04 DIAGNOSIS — G4730 Sleep apnea, unspecified: Secondary | ICD-10-CM

## 2018-01-04 DIAGNOSIS — R059 Cough, unspecified: Secondary | ICD-10-CM

## 2018-01-04 DIAGNOSIS — Z79899 Other long term (current) drug therapy: Secondary | ICD-10-CM

## 2018-01-04 DIAGNOSIS — F17210 Nicotine dependence, cigarettes, uncomplicated: Secondary | ICD-10-CM

## 2018-01-04 DIAGNOSIS — Z8679 Personal history of other diseases of the circulatory system: Secondary | ICD-10-CM

## 2018-01-04 DIAGNOSIS — G4733 Obstructive sleep apnea (adult) (pediatric): Secondary | ICD-10-CM

## 2018-01-04 DIAGNOSIS — G8929 Other chronic pain: Secondary | ICD-10-CM

## 2018-01-04 DIAGNOSIS — N926 Irregular menstruation, unspecified: Secondary | ICD-10-CM

## 2018-01-04 DIAGNOSIS — M549 Dorsalgia, unspecified: Secondary | ICD-10-CM

## 2018-01-04 DIAGNOSIS — Z7951 Long term (current) use of inhaled steroids: Secondary | ICD-10-CM

## 2018-01-04 DIAGNOSIS — I10 Essential (primary) hypertension: Secondary | ICD-10-CM

## 2018-01-04 DIAGNOSIS — R05 Cough: Secondary | ICD-10-CM

## 2018-01-04 DIAGNOSIS — J449 Chronic obstructive pulmonary disease, unspecified: Secondary | ICD-10-CM

## 2018-01-04 DIAGNOSIS — F419 Anxiety disorder, unspecified: Secondary | ICD-10-CM

## 2018-01-04 MED ORDER — ALBUTEROL SULFATE HFA 108 (90 BASE) MCG/ACT IN AERS: 2.0000 | INHALATION_SPRAY | Freq: Four times a day (QID) | RESPIRATORY_TRACT | 1 refills | Status: DC | PRN

## 2018-01-04 MED ORDER — AZITHROMYCIN 500 MG PO TABS: 500.0000 mg | ORAL_TABLET | Freq: Every day | ORAL | 0 refills | Status: AC

## 2018-01-04 MED ORDER — OMEPRAZOLE 40 MG PO CPDR: 40.0000 mg | DELAYED_RELEASE_CAPSULE | Freq: Every day | ORAL | 1 refills | Status: DC

## 2018-01-04 MED ORDER — FLUTICASONE-SALMETEROL 250-50 MCG/DOSE IN AEPB: 1.0000 | INHALATION_SPRAY | Freq: Two times a day (BID) | RESPIRATORY_TRACT | 1 refills | Status: DC

## 2018-01-04 MED FILL — VENTOLIN HFA 90 MCG INHALER: 108 (90 BAS | 25 days supply | Qty: 18 | Fill #0

## 2018-01-04 MED FILL — ADVAIR 250/50 DISKUS: 250-50 | 30 days supply | Qty: 60 | Fill #0

## 2018-01-04 MED FILL — AZITHROMYCIN 500 MG TABLET: 500 | 3 days supply | Qty: 3 | Fill #0

## 2018-01-04 MED FILL — OMEPRAZOLE DR 40 MG CAPSULE: 40 | 30 days supply | Qty: 30 | Fill #0

## 2018-01-04 NOTE — Assessment & Plan Note (Signed)
Assessment Previously on Klonopin, sertraline, lithium, and Abilify. PHQ9 score of 16. Denies SI/HI. She reports some improvement on lithium. She has had a lot of stressors in the last year in addition to losing her insurance. Will be very important for her to get back on her psych medications.  Plan - CMP for renal function prior to initiation of lithium - Behavioral health referral placed for evaluation and possible re-initiation of lithium - F/u in 4-6 weeks

## 2018-01-04 NOTE — Assessment & Plan Note (Signed)
Assessment Previously on roxicodone 15mg  5 times a day, prescribed from a pain management clinic. She had to wean herself off of this medication when she lost her insurance approximately 1 year ago.  Plan - Continue to monitor

## 2018-01-04 NOTE — Assessment & Plan Note (Addendum)
Assessment Previously on famotidine and esomeprazole. She endorses reflux symptoms. She has been using over-the-counter Tums with some relief.  Plan - Start omeprazole 40mg  once a day

## 2018-01-04 NOTE — Progress Notes (Signed)
CC: new patient to establish care and management of COPD and bipolar disorder  HPI:  Alyssa [name] is a 42 y.o. female with PMH significant for HTN, OSA, anxiety, asthma/COPD, current tobacco use, bipolar disorder, and fibromyalgia who presents to establish care and management of COPD and bipolar disorder.  She lost her insurance last year, and thus has not been taking any medications for the last year.  HTN: She states she was previously on antihypertensive medication, however she was taken off of this due to normotension.  Sleep Apnea: She used to use CPAP and oxygen at night, however she has not been able to use either of these due to her lack of insurance now. She endorses daytime sleepiness and fatigue.  COPD: She endorses productive cough for the last 2 months. Cough is productive of green/black sputum, with some streaks of blood. It is worse in the morning. She endorses associated congestion. She has tried Mucinex, allergy tablets over-the-counter, and DayQuil/NyQuil with minimal relief in the symptoms. She denies fevers or sore throat. She used to have Advair, Spiriva, albuterol inhaler, and albuterol nebulizer.  Chronic back pain: She used to be on Roxicodone 15 mg 5 times a day and gabapentin 600mg  TID. She was seen in a pain clinic for these prescriptions. She had weaned herself off of this since she lost her insurance.  Bipolar disorder/anxiety: PHQ9 score of 16. She has had a lot of stress in the last year, including going through a divorce and having a tenable living situation. She has 3 kids (age 43, [id number], [id number]) and 3 grandsons. Previously on Klonopin 0.5mg  BID, sertraline 100mg  once a day, lithium 300mg  TID with meals, lamotrigine 25mg  daily, and Abilify 5 mg daily. Denies SI/HI.  Irregular menses: She endorses irregular menses for the last 3 months. 3 months ago, her menstrual period "lingered" with episodes of spotting. 2 months ago, she did not have a menstrual period. This  month, she states that her menstrual cycle has been going on for a month (12/18-1/23). She denies flushing although she does feel lightheaded and tired.  Reflux: She endorses reflux symptoms. She was previously on esomeprazole 40mg  qday, sulcrafate 1g BID, and famotidine 40mg  QHS with relief in her symptoms. Since she lost her insurance, she has been using over-the-counter Tums with some relief.  Substance use: She smokes about a pack per day, she states is mostly due to the stress and anxiety and being in an environment where there is a lot of cigarette smoking. She has tried to quit before with Chantix and nicotine patch. She drinks ~2 glasses of alcohol per week. She denies binge drinking. She has a history of cocaine use. She states she has been clean for the last 2 months.  Past Medical History:  Diagnosis Date  . Active smoker   . Anxiety   . Asthma   . Bipolar affective (HCC)   . Bipolar disorder (HCC)   . Chronic back pain   . COPD (chronic obstructive pulmonary disease) (HCC)   . DJD (degenerative joint disease)   . Fibromyalgia   . HLD (hyperlipidemia)   . HTN (hypertension)    ACEI cough   . Hypertension   . Low back pain    2 ruptured discs in lower back   . LVH (left ventricular hypertrophy)    echo (7/11) showed EF 70%, mild LVH, no sig valvular disease, moderate pericardial effussion. echo (9/11) showed no pericardial effusion, RF 65%, mild MR, normal RV size and  systolic function.   . Osteoporosis   . Sleep apnea    Review of Systems:   Constitutional: Negative for fever. Positive for fatigue and intentional weight loss.  HEENT: Positive for chest congestion. Negative for sore throat. Respiratory: Negative for shortness of breath. Positive for productive cough and wheezing. Cardiovascular: Negative for chest pain, palpitations, leg swelling. Gastrointestinal: Negative for abdominal pain, nausea and vomiting. Positive for heartburn. Genitourinary: Negative for dysuria  and hematuria. Musculoskeletal: Negative for joint pain and myalgias. Neurological: Negative for focal weakness, weakness and headaches. Positive for dizziness/lightheadedness. Psych: Denies SI/HI. Positive for decreased appetite, fatigue, and excessive sleepiness.  PSHx: - 3 C-sections, nose surgery, carpal tunnel bilaterally  SocHx: - She smokes about a pack per day, she states is mostly due to the stress and anxiety and being in an environment where there is a lot of cigarette smoking. She has tried to quit before with Chantix and nicotine patch - She drinks approximately 2 glasses of alcohol per week. She denies binge drinking - She has a history of cocaine use. She states she has been clean for the last 2 months. - Went through a divorce last year. - Had some difficulty with housing situation in last year. - 3 kids and 3 grandsons in the area  Physical Exam:  Vitals:   01/04/18 0930  BP: 128/82  Pulse: 89  Temp: 98.2 F (36.8 C)  TempSrc: Oral  SpO2: 97%  Weight: 282 lb 11.2 oz (128.2 kg)  Height: 5\' 6"  (1.676 m)   GEN: Appears tired. Overweight female sitting in chair in NAD. Alert and oriented. Dyed hair. HENT: [place]/AT. Moist mucous membranes. No visible lesions. No erythema of posterior pharynx. EYES: PERRL. Conjunctiva clear. NECK: No cervical LAD. RESP: Diffuse expiratory wheezes that resolve after coughing. No rales. No increased work of breathing. CV: Normal rate and regular rhythm. No murmurs, gallops, or rubs. No LE edema. ABD: Soft. Non-tender. Non-distended. Normoactive bowel sounds. EXT: No edema. Warm and well perfused. NEURO: Cranial nerves II-XII grossly intact. Able to lift all four extremities against gravity. No apparent audiovisual hallucinations. Speech fluent and appropriate. PSYCH: Patient is calm and pleasant. Appears tired. Appropriate affect. Well-groomed; speech is appropriate and on-subject.  Assessment & Plan:   See Encounters Tab for problem  based charting.  Lack of insurance Patient does not currently have insurance. - Working with Rudell Cobb on [employer] process - Prescribed medications to [facility] from $4 list  Patient discussed with Dr. Cyndie Chime

## 2018-01-04 NOTE — Patient Instructions (Addendum)
FOLLOW-UP INSTRUCTIONS When: 4-6 weeks For: COPD management and health maintenance What to bring: medications   Alyssa [name],  It was a pleasure to meet you today.  The irregular menstrual cycles you are having are most likely related to menopause. We are checking a blood count today.  For your COPD, I am prescribing you Advair inhaler to be used 1 puff twice a day. I am also prescribing an albuterol inhaler to be used 2 puffs every 6 hours as needed. You can pick these [facility] for $4 [facility].  For your cough, I think the inhalers will be most helpful. But we will also prescribe you a short course of antibiotics. Please take azithromycin 500mg  once a day for 3 days. You can pick this up for $4 at [facility].  For your reflux, I am prescribing omeprazole 40mg  once day. You can pick this up for $4 at [facility].  For bipolar disorder, we want to restart you on lithium. However, in order to do this, [facility] They will call you to schedule an appointment. We are also checking some labwork prior to starting you back on lithium to monitor your kidneys.  Please see us back in 4-6 weeks for follow-up.

## 2018-01-04 NOTE — Assessment & Plan Note (Addendum)
Assessment She endorses irregular menses for the last 3 months. 3 months ago, her menstrual period "lingered" for a couple weeks with episodes of spotting. 2 months ago, she did not have a menstrual period. This month, she states that her menstrual cycle has been going on for a month (12/18-1/23). Previously, her menstrual cycles were regular. She denies flushing although she does feel lightheaded and tired.  Most likely related to menopause. Will continue to monitor. Given lightheadedness, will check CBC  Plan - Check CBC today

## 2018-01-04 NOTE — Progress Notes (Signed)
Medicine attending: Medical history, presenting problems, physical findings, and medications, reviewed with resident physician Dr Scherrie GerlachJennifer Huang on the day of the patient visit and I concur with her evaluation and management plan. New to our clinic. Med problems as summarized by Dr Renaldo ReelHuang. We will get her back on bronchodilators & inhaled steroids. Psychiatry referral to see if appropriate to go back on lithium & an antidepressant. Currently no mania symptoms.

## 2018-01-04 NOTE — Assessment & Plan Note (Signed)
Assessment Reports she was previously on antihypertensives, however this was stopped due to normotension. She does state that she lost a decent amount of weight during this period of time. BP 128/82 today. She does not require antihypertensives at this time.  Plan - Continue lifestyle modifications - F/u in 4-6 weeks

## 2018-01-04 NOTE — Assessment & Plan Note (Signed)
Assessment She was previously using CPAP and oxygen at night for sleep apnea, however has not been using this for the last year due to loss of insurance. She endorses fatigue and daytime sleepiness.  Plan - [name] - Sleep study referral when able

## 2018-01-04 NOTE — Assessment & Plan Note (Signed)
Assessment She endorses a productive green/black cough for the last 2 months, with some streaks of blood. It is worse in the morning. She endorses associated congestion. She has tried Mucinex, allergy tablets over-the-counter, and DayQuil/NyQuil with minimal relief in her cough. She does have a history of COPD/asthma and currently smokes about a pack per day. On physical exam, she had diffuse expiratory wheezes that resolved after coughing.  Her symptoms are most consistent with chronic bronchitis or COPD. Will restart inhalers and give short course of antibiotics.  Plan - Restart Advair and albuterol inhaler - Azithromycin 500 mg once a day 3 days

## 2018-01-04 NOTE — Assessment & Plan Note (Addendum)
Assessment Previously on Advair, Spiriva, albuterol nebulizer, and albuterol inhaler. She continues to smoke about a pack per day. She endorses a productive cough for the last 2 months as well as congestion.  Plan - Discussed importance of smoking cessation - Restart advair 1 puff qday and albuterol inhaler 2 puffs q6h PRN

## 2018-01-05 ENCOUNTER — Encounter: Payer: Self-pay | Admitting: Internal Medicine

## 2018-01-05 LAB — CMP14 + ANION GAP
A/G RATIO: 1.5 (ref 1.2–2.2)
ALT: 11 IU/L (ref 0–32)
AST: 9 IU/L (ref 0–40)
Albumin: 4 g/dL (ref 3.5–5.5)
Alkaline Phosphatase: 79 IU/L (ref 39–117)
Anion Gap: 14 mmol/L (ref 10.0–18.0)
BUN / CREAT RATIO: 11 (ref 9–23)
BUN: 8 mg/dL (ref 6–24)
Bilirubin Total: <0.2 mg/dL (ref 0.0–1.2)
CHLORIDE: 102 mmol/L (ref 96–106)
CO2: 25 mmol/L (ref 20–29)
Calcium: 8.8 mg/dL (ref 8.7–10.2)
Creatinine, Ser: 0.71 mg/dL (ref 0.57–1.00)
GFR calc non Af Amer: 105 mL/min/{1.73_m2} (ref >59)
GFR, EST AFRICAN AMERICAN: 121 mL/min/{1.73_m2} (ref >59)
GLUCOSE: 87 mg/dL (ref 65–99)
Globulin, Total: 2.7 g/dL (ref 1.5–4.5)
POTASSIUM: 4.6 mmol/L (ref 3.5–5.2)
Sodium: 141 mmol/L (ref 134–144)
TOTAL PROTEIN: 6.7 g/dL (ref 6.0–8.5)

## 2018-01-05 LAB — CBC
Hematocrit: 34.5 % (ref 34.0–46.6)
Hemoglobin: 10.6 g/dL — ABNORMAL LOW (ref 11.1–15.9)
MCH: 21.9 pg — ABNORMAL LOW (ref 26.6–33.0)
MCHC: 30.7 g/dL — AB (ref 31.5–35.7)
MCV: 71 fL — ABNORMAL LOW (ref 79–97)
Platelets: 334 10*3/uL (ref 150–379)
RBC: 4.84 x10E6/uL (ref 3.77–5.28)
RDW: 17.4 % — AB (ref 12.3–15.4)
WBC: 10.9 10*3/uL — ABNORMAL HIGH (ref 3.4–10.8)

## 2018-05-17 ENCOUNTER — Encounter (HOSPITAL_COMMUNITY): Payer: Self-pay | Admitting: *Deleted

## 2018-05-17 ENCOUNTER — Other Ambulatory Visit: Payer: Self-pay

## 2018-05-17 ENCOUNTER — Emergency Department (HOSPITAL_COMMUNITY)
Admission: EM | Admit: 2018-05-17 | Discharge: 2018-05-18 | Disposition: A | Discharge status: Home / Self Care | Payer: Self-pay | Attending: Emergency Medicine | Admitting: Emergency Medicine

## 2018-05-17 ENCOUNTER — Emergency Department (HOSPITAL_COMMUNITY): Payer: Self-pay

## 2018-05-17 DIAGNOSIS — F17210 Nicotine dependence, cigarettes, uncomplicated: Secondary | ICD-10-CM | POA: Insufficient documentation

## 2018-05-17 DIAGNOSIS — I10 Essential (primary) hypertension: Secondary | ICD-10-CM | POA: Insufficient documentation

## 2018-05-17 DIAGNOSIS — Z79899 Other long term (current) drug therapy: Secondary | ICD-10-CM | POA: Insufficient documentation

## 2018-05-17 DIAGNOSIS — F319 Bipolar disorder, unspecified: Secondary | ICD-10-CM | POA: Insufficient documentation

## 2018-05-17 DIAGNOSIS — F419 Anxiety disorder, unspecified: Secondary | ICD-10-CM | POA: Insufficient documentation

## 2018-05-17 DIAGNOSIS — J441 Chronic obstructive pulmonary disease with (acute) exacerbation: Secondary | ICD-10-CM | POA: Insufficient documentation

## 2018-05-17 LAB — CBC
HEMATOCRIT: 36.8 % (ref 36.0–46.0)
Hemoglobin: 10.4 g/dL — ABNORMAL LOW (ref 12.0–15.0)
MCH: 20.4 pg — AB (ref 26.0–34.0)
MCHC: 28.3 g/dL — ABNORMAL LOW (ref 30.0–36.0)
MCV: 72.3 fL — AB (ref 78.0–100.0)
PLATELETS: 292 10*3/uL (ref 150–400)
RBC: 5.09 MIL/uL (ref 3.87–5.11)
RDW: 19.9 % — AB (ref 11.5–15.5)
WBC: 6.4 10*3/uL (ref 4.0–10.5)

## 2018-05-17 LAB — I-STAT BETA HCG BLOOD, ED (MC, WL, AP ONLY): I-stat hCG, quantitative: <5 m[IU]/mL (ref <5)

## 2018-05-17 MED ORDER — ALBUTEROL SULFATE (2.5 MG/3ML) 0.083% IN NEBU
5.0000 mg | INHALATION_SOLUTION | Freq: Once | RESPIRATORY_TRACT | Status: AC
  Administered 2018-05-17: 5 mg via RESPIRATORY_TRACT
  Filled 2018-05-17: qty 6

## 2018-05-17 NOTE — ED Triage Notes (Signed)
Pt c/o worsening sob, sore throat, and ear pain for the past 3 days. Hx of COPD

## 2018-05-18 LAB — BASIC METABOLIC PANEL
Anion gap: 9 (ref 5–15)
CALCIUM: 8.5 mg/dL — AB (ref 8.9–10.3)
CHLORIDE: 105 mmol/L (ref 101–111)
CO2: 29 mmol/L (ref 22–32)
CREATININE: 0.75 mg/dL (ref 0.44–1.00)
GFR calc Af Amer: >60 mL/min (ref >60)
Glucose, Bld: 100 mg/dL — ABNORMAL HIGH (ref 65–99)
Potassium: 3.5 mmol/L (ref 3.5–5.1)
SODIUM: 143 mmol/L (ref 135–145)

## 2018-05-18 MED ORDER — AZITHROMYCIN 250 MG PO TABS: 250.0000 mg | ORAL_TABLET | Freq: Every day | ORAL | 0 refills | Status: DC

## 2018-05-18 MED ORDER — METHYLPREDNISOLONE SODIUM SUCC 125 MG IJ SOLR
125.0000 mg | Freq: Once | INTRAMUSCULAR | Status: AC
  Administered 2018-05-18: 125 mg via INTRAMUSCULAR
  Filled 2018-05-18: qty 2

## 2018-05-18 MED ORDER — PREDNISONE 10 MG PO TABS: 20.0000 mg | ORAL_TABLET | Freq: Two times a day (BID) | ORAL | 0 refills | Status: DC

## 2018-05-18 MED ORDER — IPRATROPIUM-ALBUTEROL 0.5-2.5 (3) MG/3ML IN SOLN
3.0000 mL | Freq: Once | RESPIRATORY_TRACT | Status: AC
  Administered 2018-05-18: 3 mL via RESPIRATORY_TRACT
  Filled 2018-05-18: qty 3

## 2018-05-18 MED ORDER — ALBUTEROL SULFATE HFA 108 (90 BASE) MCG/ACT IN AERS
2.0000 | INHALATION_SPRAY | Freq: Once | RESPIRATORY_TRACT | Status: AC
  Administered 2018-05-18: 2 via RESPIRATORY_TRACT
  Filled 2018-05-18: qty 6.7

## 2018-05-18 NOTE — ED Provider Notes (Signed)
[facility] EMERGENCY DEPARTMENT Provider Note   CSN: [id number] Arrival date & time: 05/17/18  2249     History   Chief Complaint Chief Complaint  Patient presents with  . COPD    HPI Eowyn HIND CHESLER is a 42 y.o. female.  Patient is a 42 year old female with past medical history of COPD who continues to smoke, bipolar, hypertension, and anxiety.  She presents today for evaluation of shortness of breath.  This has been worsening over the past several days.  She reports wheezing and cough that is nonproductive.  She denies any fevers or chills.  She is supposed to be on oxygen at night, however has no insurance and no access to medical care.  The history is provided by the patient.  COPD  This is a new problem. Episode onset: 3 days ago. The problem occurs constantly. The problem has been gradually worsening. Associated symptoms include shortness of breath. Pertinent negatives include no chest pain. Nothing aggravates the symptoms. Nothing relieves the symptoms. She has tried nothing for the symptoms.    Past Medical History:  Diagnosis Date  . Active smoker   . Anxiety   . Asthma   . Bipolar affective (HCC)   . Bipolar disorder (HCC)   . Chronic back pain   . COPD (chronic obstructive pulmonary disease) (HCC)   . DJD (degenerative joint disease)   . Fibromyalgia   . HLD (hyperlipidemia)   . HTN (hypertension)    ACEI cough   . Hypertension   . Low back pain    2 ruptured discs in lower back   . LVH (left ventricular hypertrophy)    echo (7/11) showed EF 70%, mild LVH, no sig valvular disease, moderate pericardial effussion. echo (9/11) showed no pericardial effusion, RF 65%, mild MR, normal RV size and systolic function.   . Osteoporosis   . Sleep apnea     Patient Active Problem List   Diagnosis Date Noted  . GERD (gastroesophageal reflux disease) 01/04/2018  . Chronic back pain 01/04/2018  . Irregular menses 01/04/2018  . Bipolar affect, depressed  (HCC) 06/20/2014  . Drug overdose 06/17/2014  . Overdose 06/17/2014  . Tobacco use disorder 09/07/2010  . Sleep apnea 09/07/2010  . COUGH 09/07/2010  . HYPERLIPIDEMIA 08/09/2010  . History of hypertension 08/09/2010  . COPD (chronic obstructive pulmonary disease) (HCC) 08/09/2010    Past Surgical History:  Procedure Laterality Date  . bilateral hand surgery    . CARPAL TUNNEL RELEASE    . CESAREAN SECTION     x3   . CESAREAN SECTION    . NOSE SURGERY    . TUBAL LIGATION    . tubligation       OB History    Gravida  0   Para  0   Term  0   Preterm  0   AB  0   Living        SAB  0   TAB  0   Ectopic  0   Multiple      Live Births               Home Medications    Prior to Admission medications   Medication Sig Start Date End Date Taking? Authorizing Provider  albuterol (PROVENTIL HFA;VENTOLIN HFA) 108 (90 Base) MCG/ACT inhaler Inhale 2 puffs into the lungs every 6 (six) hours as needed for wheezing or shortness of breath. IM Program 01/04/18   Scherrie Gerlach, MD  Fluticasone-Salmeterol (ADVAIR DISKUS) 250-50 MCG/DOSE AEPB Inhale 1 puff into the lungs 2 (two) times daily. IM Program 01/04/18   Scherrie GerlachHuang, Jennifer, MD  omeprazole (PRILOSEC) 40 MG capsule Take 1 capsule (40 mg total) by mouth daily. IM Program 01/04/18   Scherrie GerlachHuang, Jennifer, MD    Family History Family History  Problem Relation Age of Onset  . Coronary artery disease Unknown        family hx    Social History Social History   Tobacco Use  . Smoking status: Current Every Day Smoker    Packs/day: 1.00    Types: Cigarettes  . Smokeless tobacco: Never Used  Substance Use Topics  . Alcohol use: Yes    Comment: Twice a month.   . Drug use: No    Comment: used to use cocaine - last use 10/10     Allergies   Patient has no known allergies.   Review of Systems Review of Systems  Respiratory: Positive for shortness of breath.   Cardiovascular: Negative for chest pain.  All other  systems reviewed and are negative.    Physical Exam Updated Vital Signs BP (!) 144/102 (BP Location: Right Arm)   Pulse 89   Temp 98.2 F (36.8 C) (Oral)   Resp 16   LMP 05/10/2018   SpO2 97%   Physical Exam  Constitutional: She is oriented to person, place, and time. She appears well-developed and well-nourished. No distress.  HENT:  Head: Normocephalic and atraumatic.  Neck: Normal range of motion. Neck supple.  Cardiovascular: Normal rate and regular rhythm. Exam reveals no gallop and no friction rub.  No murmur heard. Pulmonary/Chest: Effort normal. No respiratory distress. She has no wheezes.  Breath sounds are coarse bilaterally with slight expiratory wheezing.  Abdominal: Soft. Bowel sounds are normal. She exhibits no distension. There is no tenderness.  Musculoskeletal: Normal range of motion.  Neurological: She is alert and oriented to person, place, and time.  Skin: Skin is warm and dry. She is not diaphoretic.  Nursing note and vitals reviewed.    ED Treatments / Results  Labs (all labs ordered are listed, but only abnormal results are displayed) Labs Reviewed  BASIC METABOLIC PANEL - Abnormal; Notable for the following components:      Result Value   Glucose, Bld 100 (*)    BUN <5 (*)    Calcium 8.5 (*)    All other components within normal limits  CBC - Abnormal; Notable for the following components:   Hemoglobin 10.4 (*)    MCV 72.3 (*)    MCH 20.4 (*)    MCHC 28.3 (*)    RDW 19.9 (*)    All other components within normal limits  I-STAT BETA HCG BLOOD, ED ([facility], [facility], [facility] ONLY)    EKG None  Radiology Dg Chest 2 View  Result Date: 05/17/2018 CLINICAL DATA:  Worsening productive cough and shortness of breath for the past 3 days. EXAM: CHEST - 2 VIEW COMPARISON:  Chest x-ray dated November 29, 2017. FINDINGS: The heart size and mediastinal contours are within normal limits. Both lungs are clear. The visualized skeletal structures are unremarkable.  IMPRESSION: No active cardiopulmonary disease. Electronically Signed   By: Obie DredgeWilliam T Derry M.D.   On: 05/17/2018 23:48    Procedures Procedures (including critical care time)  Medications Ordered in ED Medications  ipratropium-albuterol (DUONEB) 0.5-2.5 (3) MG/3ML nebulizer solution 3 mL (has no administration in time range)  methylPREDNISolone sodium succinate (SOLU-MEDROL) 125 mg/2 mL injection 125  mg (has no administration in time range)  albuterol (PROVENTIL HFA;VENTOLIN HFA) 108 (90 Base) MCG/ACT inhaler 2 puff (has no administration in time range)  albuterol (PROVENTIL) (2.5 MG/3ML) 0.083% nebulizer solution 5 mg (5 mg Nebulization Given 05/17/18 2317)     Initial Impression / Assessment and Plan / ED Course  I have reviewed the triage vital signs and the nursing notes.  Pertinent labs & imaging results that were available during my care of the patient were reviewed by me and considered in my medical decision making (see chart for details).  Patient with history of COPD presenting with difficulty breathing, wheezing, and congestion.  Her chest x-ray shows no evidence for pneumonia and laboratory studies are reassuring.  I suspect an exacerbation of her COPD.  This will be treated with steroids, albuterol MDI, Zithromax, and advice to pursue smoking cessation.  Final Clinical Impressions(s) / ED Diagnoses   Final diagnoses:  None    ED Discharge Orders    None       Geoffery Lyons, MD 05/18/18 [phone]

## 2018-05-18 NOTE — ED Notes (Signed)
Discharge paperwork and medications explain, pt verbalized understanding. Vss, ambulatory with nad at discharge.

## 2018-05-18 NOTE — Discharge Instructions (Addendum)
Prednisone and Zithromax as prescribed.  Albuterol inhaler every 4 hours as needed for wheezing.  I strongly advise that you pursue smoking cessation.

## 2018-08-21 ENCOUNTER — Emergency Department: Payer: Self-pay

## 2018-08-21 ENCOUNTER — Encounter: Payer: Self-pay | Admitting: Emergency Medicine

## 2018-08-21 ENCOUNTER — Other Ambulatory Visit: Payer: Self-pay

## 2018-08-21 ENCOUNTER — Emergency Department
Admission: EM | Admit: 2018-08-21 | Discharge: 2018-08-21 | Disposition: A | Discharge status: Home / Self Care | Payer: Self-pay | Attending: Student in an Organized Health Care Education/Training Program | Admitting: Student in an Organized Health Care Education/Training Program

## 2018-08-21 DIAGNOSIS — H01004 Unspecified blepharitis left upper eyelid: Secondary | ICD-10-CM | POA: Insufficient documentation

## 2018-08-21 DIAGNOSIS — F17210 Nicotine dependence, cigarettes, uncomplicated: Secondary | ICD-10-CM | POA: Insufficient documentation

## 2018-08-21 DIAGNOSIS — J449 Chronic obstructive pulmonary disease, unspecified: Secondary | ICD-10-CM | POA: Insufficient documentation

## 2018-08-21 DIAGNOSIS — M545 Low back pain, unspecified: Secondary | ICD-10-CM

## 2018-08-21 DIAGNOSIS — Z79899 Other long term (current) drug therapy: Secondary | ICD-10-CM | POA: Insufficient documentation

## 2018-08-21 DIAGNOSIS — I10 Essential (primary) hypertension: Secondary | ICD-10-CM | POA: Insufficient documentation

## 2018-08-21 LAB — POCT PREGNANCY, URINE: Preg Test, Ur: NEGATIVE

## 2018-08-21 MED ORDER — KETOROLAC TROMETHAMINE 10 MG PO TABS: 10.0000 mg | ORAL_TABLET | Freq: Four times a day (QID) | ORAL | 0 refills | Status: DC | PRN

## 2018-08-21 MED ORDER — CYCLOBENZAPRINE HCL 10 MG PO TABS: 10.0000 mg | ORAL_TABLET | Freq: Three times a day (TID) | ORAL | 0 refills | Status: DC | PRN

## 2018-08-21 MED ORDER — GABAPENTIN 100 MG PO CAPS: 100.0000 mg | ORAL_CAPSULE | Freq: Three times a day (TID) | ORAL | 0 refills | Status: DC

## 2018-08-21 MED ORDER — KETOROLAC TROMETHAMINE 30 MG/ML IJ SOLN
30.0000 mg | Freq: Once | INTRAMUSCULAR | Status: AC
  Administered 2018-08-21: 30 mg via INTRAMUSCULAR
  Filled 2018-08-21: qty 1

## 2018-08-21 MED ORDER — ORPHENADRINE CITRATE 30 MG/ML IJ SOLN
60.0000 mg | Freq: Two times a day (BID) | INTRAMUSCULAR | Status: DC
  Administered 2018-08-21: 60 mg via INTRAMUSCULAR
  Filled 2018-08-21: qty 2

## 2018-08-21 MED ORDER — CIPROFLOXACIN HCL 0.3 % OP SOLN
2.0000 [drp] | OPHTHALMIC | Status: DC
  Administered 2018-08-21: 2 [drp] via OPHTHALMIC
  Filled 2018-08-21: qty 2.5

## 2018-08-21 NOTE — ED Provider Notes (Signed)
[facility] Emergency Department Provider Note ____________________________________________  Time seen: Approximately 8:12 PM  I have reviewed the triage vital signs and the nursing notes.  HISTORY  Chief Complaint Back Pain   HPI Alyssa [name] is a 42 y.o. female presents to the emergency department for treatment and evaluation of back pain.  Pain started approximately 1 week ago.  She has had chronic back pain for which she has been treated in the past by pain management and due to lack of insurance she is no longer eligible.  She states that she has ruptured disks in her back and it is getting worse.  She also states that she has a stye on her left eye that is been present for the past 2 days that she would like to have evaluated as well.  No alleviating measures have been attempted for either complaint prior to arrival. Past Medical History:  Diagnosis Date  . Active smoker   . Anxiety   . Asthma   . Bipolar affective (HCC)   . Bipolar disorder (HCC)   . Chronic back pain   . COPD (chronic obstructive pulmonary disease) (HCC)   . DJD (degenerative joint disease)   . Fibromyalgia   . HLD (hyperlipidemia)   . HTN (hypertension)    ACEI cough   . Hypertension   . Low back pain    2 ruptured discs in lower back   . LVH (left ventricular hypertrophy)    echo (7/11) showed EF 70%, mild LVH, no sig valvular disease, moderate pericardial effussion. echo (9/11) showed no pericardial effusion, RF 65%, mild MR, normal RV size and systolic function.   . Osteoporosis   . Sleep apnea     Patient Active Problem List   Diagnosis Date Noted  . GERD (gastroesophageal reflux disease) 01/04/2018  . Chronic back pain 01/04/2018  . Irregular menses 01/04/2018  . Bipolar affect, depressed (HCC) 06/20/2014  . Drug overdose 06/17/2014  . Overdose 06/17/2014  . Tobacco use disorder 09/07/2010  . Sleep apnea 09/07/2010  . COUGH 09/07/2010  . HYPERLIPIDEMIA 08/09/2010   . History of hypertension 08/09/2010  . COPD (chronic obstructive pulmonary disease) (HCC) 08/09/2010    Past Surgical History:  Procedure Laterality Date  . bilateral hand surgery    . CARPAL TUNNEL RELEASE    . CESAREAN SECTION     x3   . CESAREAN SECTION    . NOSE SURGERY    . TUBAL LIGATION    . tubligation      Prior to Admission medications   Medication Sig Start Date End Date Taking? Authorizing Provider  albuterol (PROVENTIL HFA;VENTOLIN HFA) 108 (90 Base) MCG/ACT inhaler Inhale 2 puffs into the lungs every 6 (six) hours as needed for wheezing or shortness of breath. IM Program 01/04/18   Scherrie Gerlach, MD  azithromycin (ZITHROMAX) 250 MG tablet Take 1 tablet (250 mg total) by mouth daily. Take first 2 tablets together, then 1 every day until finished. 05/18/18   Geoffery Lyons, MD  cyclobenzaprine (FLEXERIL) 10 MG tablet Take 1 tablet (10 mg total) by mouth 3 (three) times daily as needed for muscle spasms. 08/21/18   Clemie General B, FNP  Fluticasone-Salmeterol (ADVAIR DISKUS) 250-50 MCG/DOSE AEPB Inhale 1 puff into the lungs 2 (two) times daily. IM Program 01/04/18   Scherrie Gerlach, MD  gabapentin (NEURONTIN) 100 MG capsule Take 1 capsule (100 mg total) by mouth 3 (three) times daily for 10 days. 08/21/18 08/31/18  Natavia Sublette, Kasandra Knudsen,  FNP  ketorolac (TORADOL) 10 MG tablet Take 1 tablet (10 mg total) by mouth every 6 (six) hours as needed. 08/21/18   Shamere Campas, Rulon Eisenmenger B, FNP  omeprazole (PRILOSEC) 40 MG capsule Take 1 capsule (40 mg total) by mouth daily. IM Program 01/04/18   Scherrie Gerlach, MD  predniSONE (DELTASONE) 10 MG tablet Take 2 tablets (20 mg total) by mouth 2 (two) times daily with a meal. 05/18/18   Geoffery Lyons, MD    Allergies Patient has no known allergies.  Family History  Problem Relation Age of Onset  . Coronary artery disease Unknown        family hx    Social History Social History   Tobacco Use  . Smoking status: Current Every Day Smoker    Packs/day:  1.00    Types: Cigarettes  . Smokeless tobacco: Never Used  Substance Use Topics  . Alcohol use: Yes    Comment: Twice a month.   . Drug use: No    Comment: used to use cocaine - last use 10/10    Review of Systems Constitutional: Well appearing. Respiratory: Negative for dyspnea. Cardiovascular: Negative for change in skin temperature or color. Musculoskeletal:   Negative for chronic steroid use   Negative for trauma in the presence of osteoporosis  Negative for age over 80 and trauma.  Negative for constitutional symptoms, or history of cancer  Negative for pain worse at night. Skin: Negative for rash, lesion, or wound.  Genitourinary: Negative for urinary retention. Rectal: Negative for fecal incontinence or new onset constipation/bowel habit changes. Hematological/Immunilogical: Negative for immunosuppression, IV drug use, or fever Neurological: Positive for burning, tingling, numb, electric, radiating pain in the right lower extremity.                        Negative for saddle anesthesia.                        Negative for focal neurologic deficit, progressive or disabling symptoms             Negative for saddle anesthesia. ____________________________________________   PHYSICAL EXAM:  VITAL SIGNS: ED Triage Vitals [08/21/18 1918]  Enc Vitals Group     BP (!) 167/89     Pulse Rate 92     Resp 20     Temp 97.9 F (36.6 C)     Temp Source Oral     SpO2 98 %     Weight 260 lb (117.9 kg)     Height 5\' 6"  (1.676 m)     Head Circumference      Peak Flow      Pain Score 8     Pain Loc      Pain Edu?      Excl. in GC?     Constitutional: Alert and oriented. Well appearing and in no acute distress. Eyes: Conjunctivae are clear without discharge or drainage.  Head: Atraumatic. Neck: Full, active range of motion. Respiratory: Respirations even and unlabored. Musculoskeletal: Limited flexion at the lumbar spine., Strength 5/5 of the lower extremities as  tested. Neurologic: Reflexes of the lower extremities are 2+.  Negative straight leg raise on the right and left side. Skin: Atraumatic.  Psychiatric: Behavior and affect are normal.  ____________________________________________   LABS (all labs ordered are listed, but only abnormal results are displayed)  Labs Reviewed  POCT PREGNANCY, URINE  POC URINE PREG, ED   ____________________________________________  RADIOLOGY  No acute bony pathology or acute findings of the lumbar spine per radiology ____________________________________________   PROCEDURES  Procedure(s) performed:  Procedures ____________________________________________   INITIAL IMPRESSION / ASSESSMENT AND PLAN / ED COURSE  Alyssa [name] is a 42 y.o. female who presents today with for treatment and evaluation of left pruritus to the left upper eyelid after wearing make-up a few days ago.  She is also here for treatment of acute on chronic lower back pain.  Patient states that over the past couple of months she has started a new job and is more active than usual.  She denies any injury.  While here she was given Norflex and Toradol which provided her with some relief.  She will be given prescriptions for Flexeril, Toradol, and gabapentin as this combination has helped her in the past.  She was instructed to call and schedule an appointment with [facility] community health care or another clinic in the area for continued pain.  She was instructed to return to the emergency department for symptoms that change or worsen if she is unable to schedule an appointment.  Medications  orphenadrine (NORFLEX) injection 60 mg (60 mg Intramuscular Given 08/21/18 2038)  ciprofloxacin (CILOXAN) 0.3 % ophthalmic solution 2 drop (2 drops Left Eye Given 08/21/18 2340)  ketorolac (TORADOL) 30 MG/ML injection 30 mg (30 mg Intramuscular Given 08/21/18 2038)    ED Discharge Orders         Ordered    cyclobenzaprine (FLEXERIL) 10 MG tablet   3 times daily PRN,   Status:  Discontinued     08/21/18 2252    ketorolac (TORADOL) 10 MG tablet  Every 6 hours PRN,   Status:  Discontinued     08/21/18 2252    gabapentin (NEURONTIN) 100 MG capsule  3 times daily     08/21/18 2252    ketorolac (TORADOL) 10 MG tablet  Every 6 hours PRN     08/21/18 2254    cyclobenzaprine (FLEXERIL) 10 MG tablet  3 times daily PRN     08/21/18 2254           Pertinent labs & imaging results that were available during my care of the patient were reviewed by me and considered in my medical decision making (see chart for details).  _________________________________________   FINAL CLINICAL IMPRESSION(S) / ED DIAGNOSES  Final diagnoses:  Acute lumbar back pain  Blepharitis of left upper eyelid, unspecified type     If controlled substance prescribed during this visit, 12 month history viewed on the [facility] prior to issuing an initial prescription for Schedule II or III opiod.    Chinita Pester, FNP 08/22/18 0010    Willy Eddy, MD 08/22/18 [phone]

## 2018-08-21 NOTE — ED Triage Notes (Signed)
Patient ambulatory to triage with steady gait, without difficulty or distress noted; pt reports has a ruptured disc in back and has no insurance to see anyone for it; st back pain is getting worse; also st that she has a sty on left eye x 2 days

## 2018-08-21 NOTE — ED Notes (Signed)
Back has been in pain the last week but in the past has been in pain management for her back but is not anymore due to lack of insurance. Patient states she has shooting pain down legs

## 2019-05-08 ENCOUNTER — Emergency Department: Payer: HRSA Program

## 2019-05-08 ENCOUNTER — Encounter: Payer: Self-pay | Admitting: Emergency Medicine

## 2019-05-08 ENCOUNTER — Emergency Department
Admission: EM | Admit: 2019-05-08 | Discharge: 2019-05-08 | Disposition: A | Discharge status: Home / Self Care | Payer: HRSA Program | Attending: Emergency Medicine | Admitting: Emergency Medicine

## 2019-05-08 ENCOUNTER — Other Ambulatory Visit: Payer: Self-pay

## 2019-05-08 DIAGNOSIS — F17210 Nicotine dependence, cigarettes, uncomplicated: Secondary | ICD-10-CM | POA: Diagnosis not present

## 2019-05-08 DIAGNOSIS — J069 Acute upper respiratory infection, unspecified: Secondary | ICD-10-CM

## 2019-05-08 DIAGNOSIS — I10 Essential (primary) hypertension: Secondary | ICD-10-CM | POA: Insufficient documentation

## 2019-05-08 DIAGNOSIS — Z20828 Contact with and (suspected) exposure to other viral communicable diseases: Secondary | ICD-10-CM | POA: Diagnosis not present

## 2019-05-08 DIAGNOSIS — R05 Cough: Secondary | ICD-10-CM | POA: Diagnosis present

## 2019-05-08 DIAGNOSIS — Z79899 Other long term (current) drug therapy: Secondary | ICD-10-CM | POA: Diagnosis not present

## 2019-05-08 DIAGNOSIS — J441 Chronic obstructive pulmonary disease with (acute) exacerbation: Secondary | ICD-10-CM | POA: Diagnosis not present

## 2019-05-08 MED ORDER — ALBUTEROL SULFATE HFA 108 (90 BASE) MCG/ACT IN AERS
2.0000 | INHALATION_SPRAY | Freq: Once | RESPIRATORY_TRACT | Status: AC
  Administered 2019-05-08: 2 via RESPIRATORY_TRACT
  Filled 2019-05-08: qty 6.7

## 2019-05-08 MED ORDER — DEXAMETHASONE SODIUM PHOSPHATE 10 MG/ML IJ SOLN
10.0000 mg | Freq: Once | INTRAMUSCULAR | Status: AC
Start: 2019-05-08 — End: 2019-05-08
  Administered 2019-05-08: 10 mg via INTRAMUSCULAR
  Filled 2019-05-08: qty 1

## 2019-05-08 MED ORDER — IPRATROPIUM-ALBUTEROL 0.5-2.5 (3) MG/3ML IN SOLN
3.0000 mL | Freq: Once | RESPIRATORY_TRACT | Status: AC
  Administered 2019-05-08: 23:00:00 3 mL via RESPIRATORY_TRACT
  Filled 2019-05-08: qty 3

## 2019-05-08 NOTE — ED Provider Notes (Signed)
[facility] Emergency Department Provider Note   ____________________________________________    I have reviewed the triage vital signs and the nursing notes.   HISTORY  Chief Complaint Cough and Fever     HPI Alyssa [name] is a 43 y.o. female with a history of COPD who presents with complaints of cough and low-grade temperature.  Patient reports at work yesterday she had temperature of 99.5 and has had a mild cough so she needs proof that she does not have the coronavirus before she can return to work.  She does describe some wheezing and has been out of her COPD medications for quite some time.  No calf pain or swelling.  No nausea or vomiting or diaphoresis.  Past Medical History:  Diagnosis Date  . Active smoker   . Anxiety   . Asthma   . Bipolar affective (HCC)   . Bipolar disorder (HCC)   . Chronic back pain   . COPD (chronic obstructive pulmonary disease) (HCC)   . DJD (degenerative joint disease)   . Fibromyalgia   . HLD (hyperlipidemia)   . HTN (hypertension)    ACEI cough   . Hypertension   . Low back pain    2 ruptured discs in lower back   . LVH (left ventricular hypertrophy)    echo (7/11) showed EF 70%, mild LVH, no sig valvular disease, moderate pericardial effussion. echo (9/11) showed no pericardial effusion, RF 65%, mild MR, normal RV size and systolic function.   . Osteoporosis   . Sleep apnea     Patient Active Problem List   Diagnosis Date Noted  . GERD (gastroesophageal reflux disease) 01/04/2018  . Chronic back pain 01/04/2018  . Irregular menses 01/04/2018  . Bipolar affect, depressed (HCC) 06/20/2014  . Drug overdose 06/17/2014  . Overdose 06/17/2014  . Tobacco use disorder 09/07/2010  . Sleep apnea 09/07/2010  . COUGH 09/07/2010  . HYPERLIPIDEMIA 08/09/2010  . History of hypertension 08/09/2010  . COPD (chronic obstructive pulmonary disease) (HCC) 08/09/2010    Past Surgical History:  Procedure  Laterality Date  . bilateral hand surgery    . CARPAL TUNNEL RELEASE    . CESAREAN SECTION     x3   . CESAREAN SECTION    . NOSE SURGERY    . TUBAL LIGATION    . tubligation      Prior to Admission medications   Medication Sig Start Date End Date Taking? Authorizing Provider  albuterol (PROVENTIL HFA;VENTOLIN HFA) 108 (90 Base) MCG/ACT inhaler Inhale 2 puffs into the lungs every 6 (six) hours as needed for wheezing or shortness of breath. IM Program 01/04/18   Scherrie GerlachHuang, Jennifer, MD  azithromycin (ZITHROMAX) 250 MG tablet Take 1 tablet (250 mg total) by mouth daily. Take first 2 tablets together, then 1 every day until finished. 05/18/18   Geoffery Lyonselo, Douglas, MD  cyclobenzaprine (FLEXERIL) 10 MG tablet Take 1 tablet (10 mg total) by mouth 3 (three) times daily as needed for muscle spasms. 08/21/18   Triplett, Cari B, FNP  Fluticasone-Salmeterol (ADVAIR DISKUS) 250-50 MCG/DOSE AEPB Inhale 1 puff into the lungs 2 (two) times daily. IM Program 01/04/18   Scherrie GerlachHuang, Jennifer, MD  gabapentin (NEURONTIN) 100 MG capsule Take 1 capsule (100 mg total) by mouth 3 (three) times daily for 10 days. 08/21/18 08/31/18  Triplett, Rulon Eisenmengerari B, FNP  ketorolac (TORADOL) 10 MG tablet Take 1 tablet (10 mg total) by mouth every 6 (six) hours as needed. 08/21/18   Triplett,  Cari B, FNP  omeprazole (PRILOSEC) 40 MG capsule Take 1 capsule (40 mg total) by mouth daily. IM Program 01/04/18   Scherrie Gerlach, MD  predniSONE (DELTASONE) 10 MG tablet Take 2 tablets (20 mg total) by mouth 2 (two) times daily with a meal. 05/18/18   Geoffery Lyons, MD     Allergies Patient has no known allergies.  Family History  Problem Relation Age of Onset  . Coronary artery disease Other        family hx    Social History Social History   Tobacco Use  . Smoking status: Current Every Day Smoker    Packs/day: 1.00    Types: Cigarettes  . Smokeless tobacco: Never Used  Substance Use Topics  . Alcohol use: Yes    Comment: Twice a month.   . Drug use:  No    Comment: used to use cocaine - last use 10/10    Review of Systems  Constitutional: As above Eyes: No visual changes.  ENT: No sore throat. Cardiovascular: Denies chest pain. Respiratory: As above Gastrointestinal: No abdominal pain.  No nausea, no vomiting.   Genitourinary: Negative for dysuria. Musculoskeletal: Negative for back pain. Skin: Negative for rash. Neurological: Negative for headaches or weakness   ____________________________________________   PHYSICAL EXAM:  VITAL SIGNS: ED Triage Vitals  Enc Vitals Group     BP 05/08/19 2051 (!) 165/91     Pulse Rate 05/08/19 2227 71     Resp 05/08/19 2049 18     Temp 05/08/19 2049 98.9 F (37.2 C)     Temp Source 05/08/19 2049 Oral     SpO2 05/08/19 2049 98 %     Weight 05/08/19 2050 122.5 kg (270 lb)     Height 05/08/19 2050 1.676 m ( )     Head Circumference --      Peak Flow --      Pain Score 05/08/19 2049 0     Pain Loc --      Pain Edu? --      Excl. in GC? --     Constitutional: Alert and oriented.  Eyes: Conjunctivae are normal.   Nose: No congestion/rhinnorhea. Mouth/Throat: Mucous membranes are moist.    Cardiovascular: Normal rate, regular rhythm. Grossly normal heart sounds.  Good peripheral circulation. Respiratory: Normal respiratory effort.  No retractions. Lungs CTAB. Gastrointestinal: Soft and nontender. No distention.  No CVA tenderness. Genitourinary: deferred Musculoskeletal:   Warm and well perfused Neurologic:  Normal speech and language. No gross focal neurologic deficits are appreciated.  Skin:  Skin is warm, dry and intact. No rash noted. Psychiatric: Mood and affect are normal. Speech and behavior are normal.  ____________________________________________   LABS (all labs ordered are listed, but only abnormal results are displayed)  Labs Reviewed  NOVEL CORONAVIRUS, NAA (HOSPITAL ORDER, SEND-OUT TO REF LAB)   ____________________________________________  EKG   None ____________________________________________  RADIOLOGY  Chest x-ray unremarkable ____________________________________________   PROCEDURES  Procedure(s) performed: No  Procedures   Critical Care performed: No ____________________________________________   INITIAL IMPRESSION / ASSESSMENT AND PLAN / ED COURSE  Pertinent labs & imaging results that were available during my care of the patient were reviewed by me and considered in my medical decision making (see chart for details).  Patient well-appearing and afebrile here in the emergency department, she has scattered wheezing on exam.  Will treat with IM Decadron, DuoNeb and send coronavirus test.  Given normal vitals and reassuring exam no dictation for admission at  this time.  Significant improvement after DuoNeb, appropriate for discharge at this time.    ____________________________________________   FINAL CLINICAL IMPRESSION(S) / ED DIAGNOSES  Final diagnoses:  Viral URI with cough  Chronic obstructive pulmonary disease with acute exacerbation (HCC)        Note:  This document was prepared using Dragon voice recognition software and may include unintentional dictation errors.   Jene Every, MD 05/08/19 [phone]

## 2019-05-08 NOTE — ED Triage Notes (Signed)
Pt states she was sent home from work yesterday for low grade fever and cough. Pt states she needs work note to go back to work. PT states cough, fatigue. Hx of COPD, states usually gets bronchitis every year. NAD noted

## 2019-05-08 NOTE — ED Notes (Signed)
Patient reports feeling generalized body aches and just feeling unwell. States she works at a [employer] with multiple +covid cases. Patient was told she need to be evaluated to return to work. Patient has mild wheezing present.

## 2019-05-10 LAB — NOVEL CORONAVIRUS, NAA (HOSP ORDER, SEND-OUT TO REF LAB; TAT 18-24 HRS): SARS-CoV-2, NAA: NOT DETECTED

## 2019-05-21 ENCOUNTER — Telehealth: Payer: Self-pay | Admitting: Emergency Medicine

## 2019-05-21 NOTE — Telephone Encounter (Signed)
Returned call to patient regarding covid 19 result.  I left message.

## 2019-08-07 ENCOUNTER — Telehealth: Payer: Self-pay

## 2019-08-07 ENCOUNTER — Institutional Professional Consult (permissible substitution): Payer: Self-pay | Admitting: Neurology

## 2019-08-07 NOTE — Telephone Encounter (Signed)
Pt did not show for their appt with Dr. Athar today.  

## 2019-08-08 ENCOUNTER — Encounter: Payer: Self-pay | Admitting: Neurology

## 2020-02-03 ENCOUNTER — Encounter (HOSPITAL_COMMUNITY): Payer: Self-pay | Admitting: Emergency Medicine

## 2020-02-03 ENCOUNTER — Emergency Department (HOSPITAL_COMMUNITY): Payer: Self-pay

## 2020-02-03 ENCOUNTER — Other Ambulatory Visit: Payer: Self-pay

## 2020-02-03 ENCOUNTER — Emergency Department (HOSPITAL_COMMUNITY)
Admission: EM | Admit: 2020-02-03 | Discharge: 2020-02-03 | Disposition: A | Discharge status: Home / Self Care | Payer: Self-pay | Attending: Emergency Medicine | Admitting: Emergency Medicine

## 2020-02-03 DIAGNOSIS — J449 Chronic obstructive pulmonary disease, unspecified: Secondary | ICD-10-CM | POA: Insufficient documentation

## 2020-02-03 DIAGNOSIS — R0789 Other chest pain: Secondary | ICD-10-CM | POA: Insufficient documentation

## 2020-02-03 DIAGNOSIS — F17210 Nicotine dependence, cigarettes, uncomplicated: Secondary | ICD-10-CM | POA: Insufficient documentation

## 2020-02-03 DIAGNOSIS — J45909 Unspecified asthma, uncomplicated: Secondary | ICD-10-CM | POA: Insufficient documentation

## 2020-02-03 DIAGNOSIS — Z720 Tobacco use: Secondary | ICD-10-CM

## 2020-02-03 DIAGNOSIS — M79622 Pain in left upper arm: Secondary | ICD-10-CM | POA: Insufficient documentation

## 2020-02-03 DIAGNOSIS — I10 Essential (primary) hypertension: Secondary | ICD-10-CM | POA: Insufficient documentation

## 2020-02-03 DIAGNOSIS — M79602 Pain in left arm: Secondary | ICD-10-CM

## 2020-02-03 LAB — COMPREHENSIVE METABOLIC PANEL
ALT: 12 U/L (ref 0–44)
AST: 18 U/L (ref 15–41)
Albumin: 3.6 g/dL (ref 3.5–5.0)
Alkaline Phosphatase: 63 U/L (ref 38–126)
Anion gap: 7 (ref 5–15)
BUN: 13 mg/dL (ref 6–20)
CO2: 28 mmol/L (ref 22–32)
Calcium: 8.6 mg/dL — ABNORMAL LOW (ref 8.9–10.3)
Chloride: 103 mmol/L (ref 98–111)
Creatinine, Ser: 0.69 mg/dL (ref 0.44–1.00)
GFR calc Af Amer: >60 mL/min (ref >60)
GFR calc non Af Amer: >60 mL/min (ref >60)
Glucose, Bld: 103 mg/dL — ABNORMAL HIGH (ref 70–99)
Potassium: 4.1 mmol/L (ref 3.5–5.1)
Sodium: 138 mmol/L (ref 135–145)
Total Bilirubin: 0.8 mg/dL (ref 0.3–1.2)
Total Protein: 6.7 g/dL (ref 6.5–8.1)

## 2020-02-03 LAB — CBC WITH DIFFERENTIAL/PLATELET
Abs Immature Granulocytes: 0.02 10*3/uL (ref 0.00–0.07)
Basophils Absolute: 0.1 10*3/uL (ref 0.0–0.1)
Basophils Relative: 1 %
Eosinophils Absolute: 0.4 10*3/uL (ref 0.0–0.5)
Eosinophils Relative: 6 %
HCT: 43 % (ref 36.0–46.0)
Hemoglobin: 12.6 g/dL (ref 12.0–15.0)
Immature Granulocytes: 0 %
Lymphocytes Relative: 29 %
Lymphs Abs: 2.1 10*3/uL (ref 0.7–4.0)
MCH: 23.9 pg — ABNORMAL LOW (ref 26.0–34.0)
MCHC: 29.3 g/dL — ABNORMAL LOW (ref 30.0–36.0)
MCV: 81.6 fL (ref 80.0–100.0)
Monocytes Absolute: 0.5 10*3/uL (ref 0.1–1.0)
Monocytes Relative: 7 %
Neutro Abs: 4.2 10*3/uL (ref 1.7–7.7)
Neutrophils Relative %: 57 %
Platelets: 275 10*3/uL (ref 150–400)
RBC: 5.27 MIL/uL — ABNORMAL HIGH (ref 3.87–5.11)
RDW: 16.3 % — ABNORMAL HIGH (ref 11.5–15.5)
WBC: 7.2 10*3/uL (ref 4.0–10.5)
nRBC: 0 % (ref 0.0–0.2)

## 2020-02-03 LAB — BRAIN NATRIURETIC PEPTIDE: B Natriuretic Peptide: 32.3 pg/mL (ref 0.0–100.0)

## 2020-02-03 LAB — URINALYSIS, ROUTINE W REFLEX MICROSCOPIC
Bilirubin Urine: NEGATIVE
Glucose, UA: NEGATIVE mg/dL
Hgb urine dipstick: NEGATIVE
Ketones, ur: NEGATIVE mg/dL
Leukocytes,Ua: NEGATIVE
Nitrite: NEGATIVE
Protein, ur: NEGATIVE mg/dL
Specific Gravity, Urine: 1.009 (ref 1.005–1.030)
pH: 7 (ref 5.0–8.0)

## 2020-02-03 LAB — TROPONIN I (HIGH SENSITIVITY)
Troponin I (High Sensitivity): 2 ng/L (ref <18)
Troponin I (High Sensitivity): 3 ng/L (ref <18)

## 2020-02-03 LAB — PROTIME-INR
INR: 0.8 (ref 0.8–1.2)
Prothrombin Time: 11.3 seconds — ABNORMAL LOW (ref 11.4–15.2)

## 2020-02-03 LAB — LIPASE, BLOOD: Lipase: 23 U/L (ref 11–51)

## 2020-02-03 MED ORDER — ALBUTEROL SULFATE HFA 108 (90 BASE) MCG/ACT IN AERS
6.0000 | INHALATION_SPRAY | Freq: Once | RESPIRATORY_TRACT | Status: AC
  Administered 2020-02-03: 13:00:00 6 via RESPIRATORY_TRACT
  Filled 2020-02-03: qty 6.7

## 2020-02-03 MED ORDER — ACETAMINOPHEN 500 MG PO TABS: 1000.0000 mg | ORAL_TABLET | Freq: Four times a day (QID) | ORAL | 0 refills | Status: DC | PRN

## 2020-02-03 MED ORDER — ALBUTEROL SULFATE HFA 108 (90 BASE) MCG/ACT IN AERS: 2.0000 | INHALATION_SPRAY | RESPIRATORY_TRACT | 1 refills | Status: DC | PRN

## 2020-02-03 MED ORDER — METHOCARBAMOL 500 MG PO TABS: 500.0000 mg | ORAL_TABLET | Freq: Four times a day (QID) | ORAL | 0 refills | Status: DC | PRN

## 2020-02-03 MED ORDER — IPRATROPIUM-ALBUTEROL 0.5-2.5 (3) MG/3ML IN SOLN
3.0000 mL | Freq: Once | RESPIRATORY_TRACT | Status: AC
  Administered 2020-02-03: 14:00:00 3 mL via RESPIRATORY_TRACT
  Filled 2020-02-03: qty 3

## 2020-02-03 MED ORDER — LISINOPRIL 5 MG PO TABS: 5.0000 mg | ORAL_TABLET | Freq: Every day | ORAL | 1 refills | Status: DC

## 2020-02-03 NOTE — Discharge Instructions (Addendum)
1.  Start taking lisinopril again.  To treat your blood pressure at home and keep a log.  It will be important for your doctor to know how you are responding to the medication. 2.  Try to stop smoking.  You have been given an albuterol inhaler to use as needed for wheezing. 3.  You appear to have some muscle spasm and pain.  Take a combination of acetaminophen and Robaxin (muscle relaxer) as prescribed to help with pain.  You may also apply over-the-counter patches and warm moist heat. 4.  It is very important you get established with a family doctor.  You need monitoring of hypertension.  You also should have a recheck and determine if outpatient cardiac stress testing is needed based on your risk factors. 5.  Return to the emergency department if you feel that your symptoms are worsening, changing or new concerning symptoms are developing.

## 2020-02-03 NOTE — ED Notes (Signed)
Informed MD that Covid POC is negative.

## 2020-02-03 NOTE — ED Triage Notes (Signed)
Pt c/o left chest pains that radiates to left neck and left arm for past couple days. reports has been constant and giving the felling of SOB.

## 2020-02-03 NOTE — ED Provider Notes (Signed)
[facility]-EMERGENCY DEPT Provider Note   CSN: [id number] Arrival date & time: 02/03/20  1016     History Chief Complaint  Patient presents with  . Chest Pain  . Arm Pain  . Neck Pain    Alyssa [name] is a 44 y.o. female.  HPI Patient reports that she has been having left-sided chest pain, neck pain and arm pain for several days.  She reports it feels tight and achy from her chest up to the side of her neck and into her arm.  She reports it is made worse by deep breaths or exertion.  She reports if she can get relaxed and calm and rest, it does go away but as soon as she gets up and starts doing anything it comes right back.  She reports she does have COPD and chronically has a fair amount of cough and shortness of breath.  She does continue to smoke.  Patient reports that she also has a lot of anxiety.  She is currently in a very socially stressful situation (her daughter-in-law died and now she is responsible for raising a toddler, she is living in a trailer with her sister and 81 other people trying to find her own independent living situation, adding to this is also marital stress).   Patient reports that she feels like the whole left side of her body has been giving her trouble for a number of months.  She reports she has problems with fibromyalgia, recurrent shoulder pain with crepitus, aching in her hip and leg.  She reports there is no specific injury or incident.  She has not specifically noted calf pain but generally feels that the whole left leg and side has been intermittently painful.  This includes her shoulder, chest and neck.  She does not think she has had Covid exposure although she does live with 9 people.  She reports that her sister is very paranoid about Covid and gets tested almost weekly.    Past Medical History:  Diagnosis Date  . Active smoker   . Anxiety   . Asthma   . Bipolar affective (HCC)   . Bipolar disorder (HCC)   . Chronic back  pain   . COPD (chronic obstructive pulmonary disease) (HCC)   . DJD (degenerative joint disease)   . Fibromyalgia   . HLD (hyperlipidemia)   . HTN (hypertension)    ACEI cough   . Hypertension   . Low back pain    2 ruptured discs in lower back   . LVH (left ventricular hypertrophy)    echo (7/11) showed EF 70%, mild LVH, no sig valvular disease, moderate pericardial effussion. echo (9/11) showed no pericardial effusion, RF 65%, mild MR, normal RV size and systolic function.   . Osteoporosis   . Sleep apnea     Patient Active Problem List   Diagnosis Date Noted  . GERD (gastroesophageal reflux disease) 01/04/2018  . Chronic back pain 01/04/2018  . Irregular menses 01/04/2018  . Bipolar affect, depressed (HCC) 06/20/2014  . Drug overdose 06/17/2014  . Overdose 06/17/2014  . Tobacco use disorder 09/07/2010  . Sleep apnea 09/07/2010  . COUGH 09/07/2010  . HYPERLIPIDEMIA 08/09/2010  . History of hypertension 08/09/2010  . COPD (chronic obstructive pulmonary disease) (HCC) 08/09/2010    Past Surgical History:  Procedure Laterality Date  . bilateral hand surgery    . CARPAL TUNNEL RELEASE    . CESAREAN SECTION     x3   .  CESAREAN SECTION    . NOSE SURGERY    . TUBAL LIGATION    . tubligation       OB History    Gravida  0   Para  0   Term  0   Preterm  0   AB  0   Living        SAB  0   TAB  0   Ectopic  0   Multiple      Live Births              Family History  Problem Relation Age of Onset  . Coronary artery disease Other        family hx    Social History   Tobacco Use  . Smoking status: Current Every Day Smoker    Packs/day: 1.00    Types: Cigarettes  . Smokeless tobacco: Never Used  Substance Use Topics  . Alcohol use: Yes    Comment: Twice a month.   . Drug use: No    Comment: used to use cocaine - last use 10/10    Home Medications Prior to Admission medications   Medication Sig Start Date End Date Taking? Authorizing  Provider  albuterol (PROVENTIL HFA;VENTOLIN HFA) 108 (90 Base) MCG/ACT inhaler Inhale 2 puffs into the lungs every 6 (six) hours as needed for wheezing or shortness of breath. IM Program 01/04/18  Yes Scherrie Gerlach, MD  acetaminophen (TYLENOL) 500 MG tablet Take 2 tablets (1,000 mg total) by mouth every 6 (six) hours as needed. 02/03/20   Arby Barrette, MD  albuterol (VENTOLIN HFA) 108 (90 Base) MCG/ACT inhaler Inhale 2 puffs into the lungs every 4 (four) hours as needed for wheezing or shortness of breath. 02/03/20   Arby Barrette, MD  azithromycin (ZITHROMAX) 250 MG tablet Take 1 tablet (250 mg total) by mouth daily. Take first 2 tablets together, then 1 every day until finished. Patient not taking: Reported on 02/03/2020 05/18/18   Geoffery Lyons, MD  cyclobenzaprine (FLEXERIL) 10 MG tablet Take 1 tablet (10 mg total) by mouth 3 (three) times daily as needed for muscle spasms. Patient not taking: Reported on 02/03/2020 08/21/18   Kem Boroughs B, FNP  Fluticasone-Salmeterol (ADVAIR DISKUS) 250-50 MCG/DOSE AEPB Inhale 1 puff into the lungs 2 (two) times daily. IM Program Patient not taking: Reported on 02/03/2020 01/04/18   Scherrie Gerlach, MD  gabapentin (NEURONTIN) 100 MG capsule Take 1 capsule (100 mg total) by mouth 3 (three) times daily for 10 days. 08/21/18 08/31/18  Triplett, Rulon Eisenmenger B, FNP  ketorolac (TORADOL) 10 MG tablet Take 1 tablet (10 mg total) by mouth every 6 (six) hours as needed. Patient not taking: Reported on 02/03/2020 08/21/18   Kem Boroughs B, FNP  lisinopril (ZESTRIL) 5 MG tablet Take 1 tablet (5 mg total) by mouth daily. 02/03/20   Arby Barrette, MD  methocarbamol (ROBAXIN) 500 MG tablet Take 1 tablet (500 mg total) by mouth every 6 (six) hours as needed for muscle spasms. 02/03/20   Arby Barrette, MD  omeprazole (PRILOSEC) 40 MG capsule Take 1 capsule (40 mg total) by mouth daily. IM Program Patient not taking: Reported on 02/03/2020 01/04/18   Scherrie Gerlach, MD  predniSONE  (DELTASONE) 10 MG tablet Take 2 tablets (20 mg total) by mouth 2 (two) times daily with a meal. Patient not taking: Reported on 02/03/2020 05/18/18   Geoffery Lyons, MD    Allergies    Patient has no known allergies.  Review of Systems  Review of Systems 10 Systems reviewed and are negative for acute change except as noted in the HPI.  Physical Exam Updated Vital Signs BP 135/88   Pulse 75   Temp 98.3 F (36.8 C) (Oral)   Resp (!) 23   Ht 5\' 8"  (1.727 m)   Wt 122.5 kg   SpO2 93%   BMI 41.05 kg/m   Physical Exam Constitutional:      Comments: Alert and nontoxic.  No respiratory distress at rest.  Intermittent cough.  Mental status clear.  Eyes:     Extraocular Movements: Extraocular movements intact.  Cardiovascular:     Rate and Rhythm: Normal rate and regular rhythm.  Pulmonary:     Comments: Occasional cough.  No respiratory distress at rest.  Patient does have expiratory wheeze diffusely through both lungs.  Breath sounds are somewhat diminished at bases.  No rhonchi. Abdominal:     General: There is no distension.     Palpations: Abdomen is soft.     Tenderness: There is no abdominal tenderness. There is no guarding.  Musculoskeletal:     Comments: Upper extremities are normal in appearance.  There is reproducible pain with range of motion at the shoulder on the left.  She identifies this is more chronic pain.  No abnormal swelling or erythema of the soft tissues.  Hands are warm and dry.  Radial pulses are 2+ and symmetric.  Patient does not have significant peripheral edema.  Calves are soft and pliable.  No focal reproducible tenderness.  Skin:    General: Skin is warm and dry.  Neurological:     General: No focal deficit present.     Mental Status: She is oriented to person, place, and time.     Coordination: Coordination normal.  Psychiatric:        Mood and Affect: Mood normal.     ED Results / Procedures / Treatments   Labs (all labs ordered are listed, but  only abnormal results are displayed) Labs Reviewed  COMPREHENSIVE METABOLIC PANEL - Abnormal; Notable for the following components:      Result Value   Glucose, Bld 103 (*)    Calcium 8.6 (*)    All other components within normal limits  CBC WITH DIFFERENTIAL/PLATELET - Abnormal; Notable for the following components:   RBC 5.27 (*)    MCH 23.9 (*)    MCHC 29.3 (*)    RDW 16.3 (*)    All other components within normal limits  PROTIME-INR - Abnormal; Notable for the following components:   Prothrombin Time 11.3 (*)    All other components within normal limits  URINALYSIS, ROUTINE W REFLEX MICROSCOPIC - Abnormal; Notable for the following components:   Color, Urine STRAW (*)    All other components within normal limits  LIPASE, BLOOD  BRAIN NATRIURETIC PEPTIDE  POC SARS CORONAVIRUS 2 AG -  ED  TROPONIN I (HIGH SENSITIVITY)  TROPONIN I (HIGH SENSITIVITY)    EKG EKG Interpretation  Date/Time:  Monday February 03 2020 10:21:36 EST Ventricular Rate:  81 PR Interval:    QRS Duration: 118 QT Interval:  389 QTC Calculation: 452 R Axis:   54 Text Interpretation: Sinus rhythm Nonspecific intraventricular conduction delay no sig change from previous Confirmed by [id number] [phone]) on 02/03/2020 11:08:45 AM   Radiology DG Chest Port 1 View  Result Date: 02/03/2020 CLINICAL DATA:  Chest pain EXAM: PORTABLE CHEST 1 VIEW COMPARISON:  05/08/2019 FINDINGS: The heart size and mediastinal contours are  within normal limits. No focal airspace consolidation, pleural effusion, or pneumothorax. The visualized skeletal structures are unremarkable. IMPRESSION: No active disease. Electronically Signed   By: Davina Poke D.O.   On: 02/03/2020 11:50    Procedures Procedures (including critical care time)  Medications Ordered in ED Medications  albuterol (VENTOLIN HFA) 108 (90 Base) MCG/ACT inhaler 6 puff (6 puffs Inhalation Given 02/03/20 1245)  ipratropium-albuterol (DUONEB) 0.5-2.5 (3)  MG/3ML nebulizer solution 3 mL (3 mLs Nebulization Given 02/03/20 1347)    ED Course  I have reviewed the triage vital signs and the nursing notes.  Pertinent labs & imaging results that were available during my care of the patient were reviewed by me and considered in my medical decision making (see chart for details).    MDM Rules/Calculators/A&P                     Patient has experiencing pain on the left side of her body.  That includes pain on the side of her neck her shoulder arm and chest.  Patient does have some cardiac risk factors.  She has smoking history, family history and hypertension.  At this time, EKG does not show signs of ischemic changes and 2 sets of troponins are normal.  Although patient does have risk factors, at this time I have higher suspicion for combination of musculoskeletal discomfort in the neck and shoulder and arm as etiology for pain rather than ischemic cause.  Will recommend combination of acetaminophen and Robaxin.  Patient has not had her lisinopril for several months or more.  She reports she has lost her insurance.  Patient is given resource guide for low-cost health care.  Also I have advised her to check good Rx for low-cost prescription options.  She reports she is familiar with this.  Patient is also counseled on necessity to follow-up with PCP and need for further evaluation for possible outpatient stress testing.  Patient discharged in stable condition return precautions and follow-up plan reviewed. Final Clinical Impression(s) / ED Diagnoses Final diagnoses:  Atypical chest pain  Left arm pain  Essential hypertension  Tobacco use    Rx / DC Orders ED Discharge Orders         Ordered    lisinopril (ZESTRIL) 5 MG tablet  Daily     02/03/20 1707    albuterol (VENTOLIN HFA) 108 (90 Base) MCG/ACT inhaler  Every 4 hours PRN     02/03/20 1707    acetaminophen (TYLENOL) 500 MG tablet  Every 6 hours PRN     02/03/20 1707    methocarbamol (ROBAXIN)  500 MG tablet  Every 6 hours PRN     02/03/20 1707           Charlesetta Shanks, MD 02/03/20 1712

## 2020-09-26 ENCOUNTER — Other Ambulatory Visit: Payer: Self-pay

## 2020-09-26 ENCOUNTER — Encounter: Payer: Self-pay | Admitting: Emergency Medicine

## 2020-09-26 ENCOUNTER — Emergency Department: Payer: Self-pay

## 2020-09-26 ENCOUNTER — Emergency Department
Admission: EM | Admit: 2020-09-26 | Discharge: 2020-09-26 | Disposition: A | Discharge status: Home / Self Care | Payer: Self-pay | Attending: Emergency Medicine | Admitting: Emergency Medicine

## 2020-09-26 DIAGNOSIS — F17210 Nicotine dependence, cigarettes, uncomplicated: Secondary | ICD-10-CM | POA: Insufficient documentation

## 2020-09-26 DIAGNOSIS — Z79899 Other long term (current) drug therapy: Secondary | ICD-10-CM | POA: Insufficient documentation

## 2020-09-26 DIAGNOSIS — Z20822 Contact with and (suspected) exposure to covid-19: Secondary | ICD-10-CM | POA: Insufficient documentation

## 2020-09-26 DIAGNOSIS — J441 Chronic obstructive pulmonary disease with (acute) exacerbation: Secondary | ICD-10-CM | POA: Insufficient documentation

## 2020-09-26 DIAGNOSIS — I10 Essential (primary) hypertension: Secondary | ICD-10-CM | POA: Insufficient documentation

## 2020-09-26 LAB — URINALYSIS, COMPLETE (UACMP) WITH MICROSCOPIC
Bacteria, UA: NONE SEEN
Bilirubin Urine: NEGATIVE
Glucose, UA: NEGATIVE mg/dL
Hgb urine dipstick: NEGATIVE
Ketones, ur: NEGATIVE mg/dL
Leukocytes,Ua: NEGATIVE
Nitrite: NEGATIVE
Protein, ur: NEGATIVE mg/dL
Specific Gravity, Urine: 1.017 (ref 1.005–1.030)
pH: 7 (ref 5.0–8.0)

## 2020-09-26 LAB — CBC WITH DIFFERENTIAL/PLATELET
Abs Immature Granulocytes: 0.03 10*3/uL (ref 0.00–0.07)
Basophils Absolute: 0.1 10*3/uL (ref 0.0–0.1)
Basophils Relative: 1 %
Eosinophils Absolute: 0.2 10*3/uL (ref 0.0–0.5)
Eosinophils Relative: 3 %
HCT: 42.6 % (ref 36.0–46.0)
Hemoglobin: 12.7 g/dL (ref 12.0–15.0)
Immature Granulocytes: 0 %
Lymphocytes Relative: 23 %
Lymphs Abs: 1.8 10*3/uL (ref 0.7–4.0)
MCH: 22.8 pg — ABNORMAL LOW (ref 26.0–34.0)
MCHC: 29.8 g/dL — ABNORMAL LOW (ref 30.0–36.0)
MCV: 76.6 fL — ABNORMAL LOW (ref 80.0–100.0)
Monocytes Absolute: 0.5 10*3/uL (ref 0.1–1.0)
Monocytes Relative: 6 %
Neutro Abs: 5.5 10*3/uL (ref 1.7–7.7)
Neutrophils Relative %: 67 %
Platelets: 250 10*3/uL (ref 150–400)
RBC: 5.56 MIL/uL — ABNORMAL HIGH (ref 3.87–5.11)
RDW: 17.7 % — ABNORMAL HIGH (ref 11.5–15.5)
WBC: 8.1 10*3/uL (ref 4.0–10.5)
nRBC: 0 % (ref 0.0–0.2)

## 2020-09-26 LAB — RESPIRATORY PANEL BY RT PCR (FLU A&B, COVID)
Influenza A by PCR: NEGATIVE
Influenza B by PCR: NEGATIVE
SARS Coronavirus 2 by RT PCR: NEGATIVE

## 2020-09-26 LAB — BASIC METABOLIC PANEL
Anion gap: 10 (ref 5–15)
BUN: <5 mg/dL — ABNORMAL LOW (ref 6–20)
CO2: 24 mmol/L (ref 22–32)
Calcium: 8.8 mg/dL — ABNORMAL LOW (ref 8.9–10.3)
Chloride: 103 mmol/L (ref 98–111)
Creatinine, Ser: 0.63 mg/dL (ref 0.44–1.00)
GFR, Estimated: >60 mL/min (ref >60)
Glucose, Bld: 116 mg/dL — ABNORMAL HIGH (ref 70–99)
Potassium: 3.6 mmol/L (ref 3.5–5.1)
Sodium: 137 mmol/L (ref 135–145)

## 2020-09-26 LAB — POCT PREGNANCY, URINE: Preg Test, Ur: NEGATIVE

## 2020-09-26 LAB — PREGNANCY, URINE: Preg Test, Ur: NEGATIVE

## 2020-09-26 MED ORDER — ALBUTEROL SULFATE HFA 108 (90 BASE) MCG/ACT IN AERS: 2.0000 | INHALATION_SPRAY | RESPIRATORY_TRACT | 0 refills | Status: DC | PRN

## 2020-09-26 MED ORDER — PREDNISONE 20 MG PO TABS: 40.0000 mg | ORAL_TABLET | Freq: Every day | ORAL | 0 refills | Status: DC

## 2020-09-26 MED ORDER — ALBUTEROL SULFATE (2.5 MG/3ML) 0.083% IN NEBU
5.0000 mg | INHALATION_SOLUTION | Freq: Once | RESPIRATORY_TRACT | Status: AC
  Administered 2020-09-26: 5 mg via RESPIRATORY_TRACT
  Filled 2020-09-26: qty 6

## 2020-09-26 MED ORDER — PREDNISONE 20 MG PO TABS
60.0000 mg | ORAL_TABLET | Freq: Once | ORAL | Status: AC
  Administered 2020-09-26: 60 mg via ORAL
  Filled 2020-09-26: qty 3

## 2020-09-26 MED ORDER — AZITHROMYCIN 250 MG PO TABS: ORAL_TABLET | ORAL | 0 refills | Status: DC

## 2020-09-26 MED ORDER — ACETAMINOPHEN 325 MG PO TABS
650.0000 mg | ORAL_TABLET | Freq: Once | ORAL | Status: AC
  Administered 2020-09-26: 650 mg via ORAL
  Filled 2020-09-26: qty 2

## 2020-09-26 MED ORDER — IPRATROPIUM-ALBUTEROL 0.5-2.5 (3) MG/3ML IN SOLN
3.0000 mL | Freq: Once | RESPIRATORY_TRACT | Status: DC
  Filled 2020-09-26: qty 3

## 2020-09-26 NOTE — ED Provider Notes (Signed)
[facility] Emergency Department Provider Note  ____________________________________________  Time seen: Approximately 6:00 AM  I have reviewed the triage vital signs and the nursing notes.   HISTORY  Chief Complaint Shortness of Breath    HPI Alyssa [name] is a 44 y.o. female with a history of bipolar disorder, COPD, GERD who comes ED complaining of shortness of breath and wheezing, feels like a COPD flareup, gradual onset and worsening for the past 2 days.  No aggravating or alleviating factors, ran out of her albuterol at home.  No fevers or chills.  Has some increased cough but no sputum production.  Patient also complains of poor appetite, fatigue, generalized body aches, chills.  No known sick contacts.      Past Medical History:  Diagnosis Date  . Active smoker   . Anxiety   . Asthma   . Bipolar affective (HCC)   . Bipolar disorder (HCC)   . Chronic back pain   . COPD (chronic obstructive pulmonary disease) (HCC)   . DJD (degenerative joint disease)   . Fibromyalgia   . HLD (hyperlipidemia)   . HTN (hypertension)    ACEI cough   . Hypertension   . Low back pain    2 ruptured discs in lower back   . LVH (left ventricular hypertrophy)    echo (7/11) showed EF 70%, mild LVH, no sig valvular disease, moderate pericardial effussion. echo (9/11) showed no pericardial effusion, RF 65%, mild MR, normal RV size and systolic function.   . Osteoporosis   . Sleep apnea      Patient Active Problem List   Diagnosis Date Noted  . GERD (gastroesophageal reflux disease) 01/04/2018  . Chronic back pain 01/04/2018  . Irregular menses 01/04/2018  . Bipolar affect, depressed (HCC) 06/20/2014  . Drug overdose 06/17/2014  . Overdose 06/17/2014  . Tobacco use disorder 09/07/2010  . Sleep apnea 09/07/2010  . COUGH 09/07/2010  . HYPERLIPIDEMIA 08/09/2010  . History of hypertension 08/09/2010  . COPD (chronic obstructive pulmonary disease) (HCC)  08/09/2010     Past Surgical History:  Procedure Laterality Date  . bilateral hand surgery    . CARPAL TUNNEL RELEASE    . CESAREAN SECTION     x3   . CESAREAN SECTION    . NOSE SURGERY    . TUBAL LIGATION    . tubligation       Prior to Admission medications   Medication Sig Start Date End Date Taking? Authorizing Provider  acetaminophen (TYLENOL) 500 MG tablet Take 2 tablets (1,000 mg total) by mouth every 6 (six) hours as needed. 02/03/20   Arby Barrette, MD  albuterol (PROVENTIL HFA) 108 (90 Base) MCG/ACT inhaler Inhale 2 puffs into the lungs every 4 (four) hours as needed for wheezing or shortness of breath. 09/26/20   Sharman Cheek, MD  azithromycin (ZITHROMAX Z-PAK) 250 MG tablet Take 2 tablets (500 mg) on  Day 1,  followed by 1 tablet (250 mg) once daily on Days 2 through 5. 09/26/20   Sharman Cheek, MD  cyclobenzaprine (FLEXERIL) 10 MG tablet Take 1 tablet (10 mg total) by mouth 3 (three) times daily as needed for muscle spasms. Patient not taking: Reported on 02/03/2020 08/21/18   Kem Boroughs B, FNP  Fluticasone-Salmeterol (ADVAIR DISKUS) 250-50 MCG/DOSE AEPB Inhale 1 puff into the lungs 2 (two) times daily. IM Program Patient not taking: Reported on 02/03/2020 01/04/18   Scherrie Gerlach, MD  gabapentin (NEURONTIN) 100 MG capsule Take 1 capsule (  100 mg total) by mouth 3 (three) times daily for 10 days. 08/21/18 08/31/18  Triplett, Rulon Eisenmengerari B, FNP  ketorolac (TORADOL) 10 MG tablet Take 1 tablet (10 mg total) by mouth every 6 (six) hours as needed. Patient not taking: Reported on 02/03/2020 08/21/18   Kem Boroughsriplett, Cari B, FNP  lisinopril (ZESTRIL) 5 MG tablet Take 1 tablet (5 mg total) by mouth daily. 02/03/20   Arby BarrettePfeiffer, Marcy, MD  methocarbamol (ROBAXIN) 500 MG tablet Take 1 tablet (500 mg total) by mouth every 6 (six) hours as needed for muscle spasms. 02/03/20   Arby BarrettePfeiffer, Marcy, MD  omeprazole (PRILOSEC) 40 MG capsule Take 1 capsule (40 mg total) by mouth daily. IM  Program Patient not taking: Reported on 02/03/2020 01/04/18   Scherrie GerlachHuang, Jennifer, MD  predniSONE (DELTASONE) 20 MG tablet Take 2 tablets (40 mg total) by mouth daily. 09/26/20   Sharman CheekStafford, Izael Bessinger, MD     Allergies Patient has no known allergies.   Family History  Problem Relation Age of Onset  . Coronary artery disease Other        family hx    Social History Social History   Tobacco Use  . Smoking status: Current Every Day Smoker    Packs/day: 0.50    Types: Cigarettes  . Smokeless tobacco: Never Used  Substance Use Topics  . Alcohol use: Yes    Comment: Twice a month.   . Drug use: No    Comment: used to use cocaine - last use 10/10    Review of Systems  Constitutional:   No fever positive chills.  ENT:   No sore throat. No rhinorrhea. Cardiovascular:   No chest pain or syncope. Respiratory:   Positive shortness of breath wheezing and nonproductive cough. Gastrointestinal:   Negative for abdominal pain, vomiting and diarrhea.  Musculoskeletal:   Negative for focal pain or swelling All other systems reviewed and are negative except as documented above in ROS and HPI.  ____________________________________________   PHYSICAL EXAM:  VITAL SIGNS: ED Triage Vitals  Enc Vitals Group     BP 09/26/20 0209 (!) 169/112     Pulse Rate 09/26/20 0209 87     Resp 09/26/20 0209 20     Temp 09/26/20 0209 98.2 F (36.8 C)     Temp Source 09/26/20 0209 Oral     SpO2 09/26/20 0209 98 %     Weight 09/26/20 0210 259 lb (117.5 kg)     Height 09/26/20 0210 5\' 6"  (1.676 m)     Head Circumference --      Peak Flow --      Pain Score 09/26/20 0210 6     Pain Loc --      Pain Edu? --      Excl. in GC? --     Vital signs reviewed, nursing assessments reviewed.   Constitutional:   Alert and oriented. Non-toxic appearance. Eyes:   Conjunctivae are normal. EOMI. PERRL. ENT      Head:   Normocephalic and atraumatic.      Nose:   Wearing a mask.      Mouth/Throat:   Wearing a  mask.      Neck:   No meningismus. Full ROM. Hematological/Lymphatic/Immunilogical:   No cervical lymphadenopathy. Cardiovascular:   RRR. Symmetric bilateral radial and DP pulses.  No murmurs. Cap refill less than 2 seconds. Respiratory:   Normal respiratory effort without tachypnea/retractions.  Diffuse expiratory wheezing and mildly prolonged expiratory phase.  Accentuated by FEV1 maneuver Gastrointestinal:  Soft and nontender. Non distended. There is no CVA tenderness.  No rebound, rigidity, or guarding.  Musculoskeletal:   Normal range of motion in all extremities. No joint effusions.  No lower extremity tenderness.  No edema. Neurologic:   Normal speech and language.  Motor grossly intact. No acute focal neurologic deficits are appreciated.  Skin:    Skin is warm, dry and intact. No rash noted.  No petechiae, purpura, or bullae.  ____________________________________________    LABS (pertinent positives/negatives) (all labs ordered are listed, but only abnormal results are displayed) Labs Reviewed  CBC WITH DIFFERENTIAL/PLATELET - Abnormal; Notable for the following components:      Result Value   RBC 5.56 (*)    MCV 76.6 (*)    MCH 22.8 (*)    MCHC 29.8 (*)    RDW 17.7 (*)    All other components within normal limits  BASIC METABOLIC PANEL - Abnormal; Notable for the following components:   Glucose, Bld 116 (*)    BUN <5 (*)    Calcium 8.8 (*)    All other components within normal limits  RESPIRATORY PANEL BY RT PCR (FLU A&B, COVID)  URINALYSIS, COMPLETE (UACMP) WITH MICROSCOPIC  PREGNANCY, URINE   ____________________________________________   EKG  Interpreted by me  Date: 09/26/2020  Rate: 88  Rhythm: normal sinus rhythm  QRS Axis: normal  Intervals: normal  ST/T Wave abnormalities: normal  Conduction Disutrbances: none  Narrative Interpretation: unremarkable      ____________________________________________    RADIOLOGY  DG Chest 2 View  Result  Date: 09/26/2020 CLINICAL DATA:  44 year old female with shortness of breath. EXAM: CHEST - 2 VIEW COMPARISON:  Chest radiograph dated 02/03/2020. FINDINGS: Faint left lung base hazy density, likely atelectasis. Clinical correlation is recommended. No focal consolidation, pleural effusion, pneumothorax. The cardiac silhouette is within limits. No acute osseous pathology. IMPRESSION: Probable minimal left lung base atelectasis. No focal consolidation. Electronically Signed   By: Elgie Collard M.D.   On: 09/26/2020 03:15    ____________________________________________   PROCEDURES Procedures  ____________________________________________  DIFFERENTIAL DIAGNOSIS   COPD exacerbation, Covid, pneumonia, pneumothorax  CLINICAL IMPRESSION / ASSESSMENT AND PLAN / ED COURSE  Medications ordered in the ED: Medications  ipratropium-albuterol (DUONEB) 0.5-2.5 (3) MG/3ML nebulizer solution 3 mL (has no administration in time range)  acetaminophen (TYLENOL) tablet 650 mg (650 mg Oral Given 09/26/20 0223)  predniSONE (DELTASONE) tablet 60 mg (60 mg Oral Given 09/26/20 0553)  albuterol (PROVENTIL) (2.5 MG/3ML) 0.083% nebulizer solution 5 mg (5 mg Nebulization Given 09/26/20 0555)    Pertinent labs & imaging results that were available during my care of the patient were reviewed by me and considered in my medical decision making (see chart for details).  Alyssa [name] was evaluated in Emergency Department on 09/26/2020 for the symptoms described in the history of present illness. She was evaluated in the context of the global COVID-19 pandemic, which necessitated consideration that the patient might be at risk for infection with the SARS-CoV-2 virus that causes COVID-19. Institutional protocols and algorithms that pertain to the evaluation of patients at risk for COVID-19 are in a state of rapid change based on information released by regulatory bodies including the CDC and federal and state  organizations. These policies and algorithms were followed during the patient's care in the ED.   Patient presents with likely COPD exacerbation.  She is nontoxic, vital signs unremarkable, oxygenation is normal.  Chest x-ray labs and Covid test are all unremarkable.  We  will give prednisone and bronchodilators in the ED.  Discharged with a refill of albuterol, prednisone course, and azithromycin course given underlying lung disease with COPD.  Stable for outpatient management, will refer to primary care/open-door clinic.      ____________________________________________   FINAL CLINICAL IMPRESSION(S) / ED DIAGNOSES    Final diagnoses:  COPD exacerbation [facility])     ED Discharge Orders         Ordered    predniSONE (DELTASONE) 20 MG tablet  Daily        09/26/20 0548    albuterol (PROVENTIL HFA) 108 (90 Base) MCG/ACT inhaler  Every 4 hours PRN        09/26/20 0548    azithromycin (ZITHROMAX Z-PAK) 250 MG tablet        09/26/20 0548          Portions of this note were generated with dragon dictation software. Dictation errors may occur despite best attempts at proofreading.   Sharman Cheek, MD 09/26/20 [phone]

## 2020-09-26 NOTE — ED Notes (Signed)
Pt sleeping in lobby. Rise and fall of chest noted and pt is NAD.

## 2020-09-26 NOTE — ED Triage Notes (Signed)
Pt arrives POV and ambulatory to triage with c/o of COPD and/or asthma flare. Pt has some wheezing at this time but no use of accessory muscles and pt is in NAD.

## 2020-09-26 NOTE — ED Notes (Signed)
MD In to evaluate patient before this RN. Patient tolerating nebulizer treatment well. Will continue to monitor.

## 2021-02-08 ENCOUNTER — Telehealth: Payer: Self-pay | Admitting: General Practice

## 2021-02-08 NOTE — Telephone Encounter (Signed)
Copied from CRM [id number]. Topic: General - Other >> Feb 08, 2021 11:21 AM Pawlus, Maxine Glenn A wrote: Reason for CRM: Pt needs an appt to set up an [facility] card. Requested appt with Mikle Bosworth. Please CB.  Called patient and LVM advising her that I was calling from [facility] in regards to an appointment. Advised her to call [phone] to schedule.    If patient calls back she must establish care with one of our providers prior to being scheduled with financial. Please advise her that she needs first have an appointment with a provider and then at that initial appointment we will give her the application for financial assistance and schedule her an appt.

## 2021-02-15 ENCOUNTER — Ambulatory Visit: Payer: Self-pay | Admitting: Nurse Practitioner

## 2021-02-17 ENCOUNTER — Ambulatory Visit: Payer: Self-pay

## 2021-03-11 ENCOUNTER — Ambulatory Visit (HOSPITAL_COMMUNITY)
Admission: EM | Admit: 2021-03-11 | Discharge: 2021-03-11 | Disposition: A | Discharge status: Home / Self Care | Payer: Self-pay | Attending: Student | Admitting: Student

## 2021-03-11 ENCOUNTER — Other Ambulatory Visit: Payer: Self-pay

## 2021-03-11 DIAGNOSIS — J301 Allergic rhinitis due to pollen: Secondary | ICD-10-CM | POA: Insufficient documentation

## 2021-03-11 DIAGNOSIS — N3001 Acute cystitis with hematuria: Secondary | ICD-10-CM | POA: Insufficient documentation

## 2021-03-11 DIAGNOSIS — J441 Chronic obstructive pulmonary disease with (acute) exacerbation: Secondary | ICD-10-CM | POA: Insufficient documentation

## 2021-03-11 DIAGNOSIS — R300 Dysuria: Secondary | ICD-10-CM

## 2021-03-11 DIAGNOSIS — Z3202 Encounter for pregnancy test, result negative: Secondary | ICD-10-CM

## 2021-03-11 LAB — POCT URINALYSIS DIPSTICK, ED / UC
Bilirubin Urine: NEGATIVE
Glucose, UA: NEGATIVE mg/dL
Ketones, ur: NEGATIVE mg/dL
Nitrite: NEGATIVE
Protein, ur: NEGATIVE mg/dL
Specific Gravity, Urine: 1.01 (ref 1.005–1.030)
Urobilinogen, UA: 0.2 mg/dL (ref 0.0–1.0)
pH: 7 (ref 5.0–8.0)

## 2021-03-11 LAB — POC URINE PREG, ED: Preg Test, Ur: NEGATIVE

## 2021-03-11 MED ORDER — CETIRIZINE HCL 10 MG PO TABS: 10.0000 mg | ORAL_TABLET | Freq: Every day | ORAL | 2 refills | Status: AC

## 2021-03-11 MED ORDER — CEPHALEXIN 500 MG PO CAPS: 500.0000 mg | ORAL_CAPSULE | Freq: Four times a day (QID) | ORAL | 0 refills | Status: DC

## 2021-03-11 MED ORDER — PREDNISONE 10 MG (21) PO TBPK: ORAL_TABLET | Freq: Every day | ORAL | 0 refills | Status: DC

## 2021-03-11 MED ORDER — ALBUTEROL SULFATE HFA 108 (90 BASE) MCG/ACT IN AERS: 1.0000 | INHALATION_SPRAY | Freq: Four times a day (QID) | RESPIRATORY_TRACT | 0 refills | Status: AC | PRN

## 2021-03-11 NOTE — ED Triage Notes (Signed)
Pt presents with discomfort when urinating, cloudy urine and increased urinary frequency x 3 days. Denies fever, chill, abdominal pian, vaginal discharge.

## 2021-03-11 NOTE — ED Provider Notes (Signed)
[facility]    CSN: [id number] Arrival date & time: 03/11/21  1909      History   Chief Complaint Chief Complaint  Patient presents with  . Dysuria    HPI Alyssa [name] is a 45 y.o. female presenting for dysuria x3 days and COPD exacerbation.  States she has not had medical treatment in some time due to being uninsured.  History of obesity, COPD, fibromyalgia, hyperlipidemia, hypertension, chronic back pain, asthma, bipolar, GERD.    Notes 3 days of dysuria, cloudy urine, urinary frequency. Denies hematuria, urgency, back pain, n/v/d/abd pain, fevers/chills, abdnormal vaginal discharge, STI risk.  Also states she has had a 2-week exacerbation of her COPD.  States she was previously on albuterol and Symbicort for this but has been on no inhalers for some time due to being uninsured.  Endorses 2 weeks of productive cough, worse at night, shortness of breath with exertion.  Nasal congestion, endorses seasonal allergies for which she is taking no medication. Denies fevers/chills, n/v/d, chest pain,  facial pain, teeth pain, headaches, sore throat, loss of taste/smell, swollen lymph nodes, ear pain.     HPI  Past Medical History:  Diagnosis Date  . Active smoker   . Anxiety   . Asthma   . Bipolar affective (HCC)   . Bipolar disorder (HCC)   . Chronic back pain   . COPD (chronic obstructive pulmonary disease) (HCC)   . DJD (degenerative joint disease)   . Fibromyalgia   . HLD (hyperlipidemia)   . HTN (hypertension)    ACEI cough   . Hypertension   . Low back pain    2 ruptured discs in lower back   . LVH (left ventricular hypertrophy)    echo (7/11) showed EF 70%, mild LVH, no sig valvular disease, moderate pericardial effussion. echo (9/11) showed no pericardial effusion, RF 65%, mild MR, normal RV size and systolic function.   . Osteoporosis   . Sleep apnea     Patient Active Problem List   Diagnosis Date Noted  . GERD (gastroesophageal reflux disease)  01/04/2018  . Chronic back pain 01/04/2018  . Irregular menses 01/04/2018  . Bipolar affect, depressed (HCC) 06/20/2014  . Drug overdose 06/17/2014  . Overdose 06/17/2014  . Tobacco use disorder 09/07/2010  . Sleep apnea 09/07/2010  . COUGH 09/07/2010  . HYPERLIPIDEMIA 08/09/2010  . History of hypertension 08/09/2010  . COPD (chronic obstructive pulmonary disease) (HCC) 08/09/2010    Past Surgical History:  Procedure Laterality Date  . bilateral hand surgery    . CARPAL TUNNEL RELEASE    . CESAREAN SECTION     x3   . CESAREAN SECTION    . NOSE SURGERY    . TUBAL LIGATION    . tubligation      OB History    Gravida  0   Para  0   Term  0   Preterm  0   AB  0   Living        SAB  0   IAB  0   Ectopic  0   Multiple      Live Births               Home Medications    Prior to Admission medications   Medication Sig Start Date End Date Taking? Authorizing Provider  albuterol (VENTOLIN HFA) 108 (90 Base) MCG/ACT inhaler Inhale 1-2 puffs into the lungs every 6 (six) hours as needed for wheezing or shortness  of breath. 03/11/21  Yes Rhys Martini, PA-C  cephALEXin (KEFLEX) 500 MG capsule Take 1 capsule (500 mg total) by mouth 4 (four) times daily. 03/11/21  Yes Rhys Martini, PA-C  cetirizine (ZYRTEC ALLERGY) 10 MG tablet Take 1 tablet (10 mg total) by mouth daily. 03/11/21  Yes Rhys Martini, PA-C  predniSONE (STERAPRED UNI-PAK 21 TAB) 10 MG (21) TBPK tablet Take by mouth daily. Take 6 tabs by mouth daily  for 2 days, then 5 tabs for 2 days, then 4 tabs for 2 days, then 3 tabs for 2 days, 2 tabs for 2 days, then 1 tab by mouth daily for 2 days 03/11/21  Yes Rhys Martini, PA-C  acetaminophen (TYLENOL) 500 MG tablet Take 2 tablets (1,000 mg total) by mouth every 6 (six) hours as needed. 02/03/20   Arby Barrette, MD  azithromycin (ZITHROMAX Z-PAK) 250 MG tablet Take 2 tablets (500 mg) on  Day 1,  followed by 1 tablet (250 mg) once daily on Days 2 through 5.  09/26/20   Sharman Cheek, MD  gabapentin (NEURONTIN) 100 MG capsule Take 1 capsule (100 mg total) by mouth 3 (three) times daily for 10 days. 08/21/18 08/31/18  Triplett, Rulon Eisenmenger B, FNP  lisinopril (ZESTRIL) 5 MG tablet Take 1 tablet (5 mg total) by mouth daily. 02/03/20   Arby Barrette, MD  methocarbamol (ROBAXIN) 500 MG tablet Take 1 tablet (500 mg total) by mouth every 6 (six) hours as needed for muscle spasms. 02/03/20   Arby Barrette, MD  Fluticasone-Salmeterol (ADVAIR DISKUS) 250-50 MCG/DOSE AEPB Inhale 1 puff into the lungs 2 (two) times daily. IM Program Patient not taking: No sig reported 01/04/18 03/11/21  Scherrie Gerlach, MD  omeprazole (PRILOSEC) 40 MG capsule Take 1 capsule (40 mg total) by mouth daily. IM Program Patient not taking: No sig reported 01/04/18 03/11/21  Scherrie Gerlach, MD    Family History Family History  Problem Relation Age of Onset  . Coronary artery disease Other        family hx    Social History Social History   Tobacco Use  . Smoking status: Current Every Day Smoker    Packs/day: 0.50    Types: Cigarettes  . Smokeless tobacco: Never Used  Substance Use Topics  . Alcohol use: Yes    Comment: Twice a month.   . Drug use: No    Comment: used to use cocaine - last use 10/10     Allergies   Patient has no known allergies.   Review of Systems Review of Systems  Constitutional: Negative for appetite change, chills, diaphoresis and fever.  Respiratory: Negative for shortness of breath.   Cardiovascular: Negative for chest pain.  Gastrointestinal: Negative for abdominal pain, blood in stool, constipation, diarrhea, nausea and vomiting.  Genitourinary: Positive for dysuria and frequency. Negative for decreased urine volume, difficulty urinating, flank pain, genital sores, hematuria and urgency.  Musculoskeletal: Negative for back pain.  Neurological: Negative for dizziness, weakness and light-headedness.  All other systems reviewed and are  negative.    Physical Exam Triage Vital Signs ED Triage Vitals  Enc Vitals Group     BP 03/11/21 1921 (!) 156/86     Pulse Rate 03/11/21 1921 88     Resp 03/11/21 1921 20     Temp 03/11/21 1921 98.5 F (36.9 C)     Temp Source 03/11/21 1921 Oral     SpO2 03/11/21 1921 98 %     Weight --  Height --      Head Circumference --      Peak Flow --      Pain Score 03/11/21 1919 0     Pain Loc --      Pain Edu? --      Excl. in GC? --    No data found.  Updated Vital Signs BP (!) 156/86 (BP Location: Right Arm)   Pulse 88   Temp 98.5 F (36.9 C) (Oral)   Resp 20   LMP  (Within Months) Comment: 1 month   SpO2 98%   Visual Acuity Right Eye Distance:   Left Eye Distance:   Bilateral Distance:    Right Eye Near:   Left Eye Near:    Bilateral Near:     Physical Exam Vitals reviewed.  Constitutional:      General: She is not in acute distress.    Appearance: Normal appearance. She is not ill-appearing.  HENT:     Head: Normocephalic and atraumatic.     Mouth/Throat:     Mouth: Mucous membranes are moist.     Pharynx: Uvula midline. No pharyngeal swelling or posterior oropharyngeal erythema.     Tonsils: 0 on the right. 0 on the left.     Comments: Moist mucous membranes Eyes:     Extraocular Movements: Extraocular movements intact.     Pupils: Pupils are equal, round, and reactive to light.  Cardiovascular:     Rate and Rhythm: Normal rate and regular rhythm.     Heart sounds: Normal heart sounds.  Pulmonary:     Effort: Pulmonary effort is normal.     Breath sounds: Normal breath sounds. No decreased breath sounds, wheezing, rhonchi or rales.     Comments: Occ cough Abdominal:     General: Bowel sounds are normal. There is no distension.     Palpations: Abdomen is soft. There is no mass.     Tenderness: There is no abdominal tenderness. There is no right CVA tenderness, left CVA tenderness, guarding or rebound.  Skin:    General: Skin is warm.      Capillary Refill: Capillary refill takes less than 2 seconds.     Comments: Good skin turgor  Neurological:     General: No focal deficit present.     Mental Status: She is alert and oriented to person, place, and time.  Psychiatric:        Mood and Affect: Mood normal.        Behavior: Behavior normal.      UC Treatments / Results  Labs (all labs ordered are listed, but only abnormal results are displayed) Labs Reviewed  POCT URINALYSIS DIPSTICK, ED / UC - Abnormal; Notable for the following components:      Result Value   Hgb urine dipstick MODERATE (*)    Leukocytes,Ua LARGE (*)    All other components within normal limits  URINE CULTURE  POC URINE PREG, ED    EKG   Radiology No results found.  Procedures Procedures (including critical care time)  Medications Ordered in UC Medications - No data to display  Initial Impression / Assessment and Plan / UC Course  I have reviewed the triage vital signs and the nursing notes.  Pertinent labs & imaging results that were available during my care of the patient were reviewed by me and considered in my medical decision making (see chart for details).     This patient is a 45 year old female presenting with acute  cystitis with hematuria, COPD exacerbation, allergic rhinitis. Today this pt is afebrile nontachycardic nontachypneic, oxygenating well on room air, no wheezes rhonchi or rales.  No CVAT, low concern for pyelo.  UA today with moderate blood, large leuk.  Culture sent. Negative urine pregnancy. Plan to treat with Keflex as below. Rec good hydration.   For COPD exacerbation, sent prednisone taper.  She is not a diabetic.  Also sent albuterol inhaler.  ED return precautions discussed.   This chart was dictated using voice recognition software, Dragon. Despite the best efforts of this provider to proofread and correct errors, errors may still occur which can change documentation meaning.   Final Clinical  Impressions(s) / UC Diagnoses   Final diagnoses:  Acute cystitis with hematuria  COPD exacerbation (HCC)  Seasonal allergic rhinitis due to pollen     Discharge Instructions     -For UTI, start the antibiotic-Keflex (cephalexin), 4 pills daily for 5 days.  Also make sure to drink plenty of water. -For your COPD exacerbation, start the prednisone taper.  Follow directions on the package. -I also refilled your albuterol inhaler.  Use this as needed for cough and shortness of breath. -I also sent a prescription for Zyrtec for your allergies.  Try this for about a week, if you like it continue it. -Seek additional immediate medical attention if you experience worsening of symptoms despite treatment, like shortness of breath, fever/chills, abdominal pain, chest pain, dizziness etc.    ED Prescriptions    Medication Sig Dispense Auth. Provider   cephALEXin (KEFLEX) 500 MG capsule Take 1 capsule (500 mg total) by mouth 4 (four) times daily. 20 capsule Rhys Martini, PA-C   predniSONE (STERAPRED UNI-PAK 21 TAB) 10 MG (21) TBPK tablet Take by mouth daily. Take 6 tabs by mouth daily  for 2 days, then 5 tabs for 2 days, then 4 tabs for 2 days, then 3 tabs for 2 days, 2 tabs for 2 days, then 1 tab by mouth daily for 2 days 42 tablet Rhys Martini, PA-C   albuterol (VENTOLIN HFA) 108 (90 Base) MCG/ACT inhaler Inhale 1-2 puffs into the lungs every 6 (six) hours as needed for wheezing or shortness of breath. 1 each Rhys Martini, PA-C   cetirizine (ZYRTEC ALLERGY) 10 MG tablet Take 1 tablet (10 mg total) by mouth daily. 30 tablet Rhys Martini, PA-C     PDMP not reviewed this encounter.   Rhys Martini, PA-C 03/11/21 1951

## 2021-03-11 NOTE — Discharge Instructions (Addendum)
-  For UTI, start the antibiotic-Keflex (cephalexin), 4 pills daily for 5 days.  Also make sure to drink plenty of water. -For your COPD exacerbation, start the prednisone taper.  Follow directions on the package. -I also refilled your albuterol inhaler.  Use this as needed for cough and shortness of breath. -I also sent a prescription for Zyrtec for your allergies.  Try this for about a week, if you like it continue it. -Seek additional immediate medical attention if you experience worsening of symptoms despite treatment, like shortness of breath, fever/chills, abdominal pain, chest pain, dizziness etc.

## 2021-03-14 LAB — URINE CULTURE: Culture: >=100000 — AB

## 2021-08-08 ENCOUNTER — Emergency Department (HOSPITAL_COMMUNITY): Payer: Self-pay

## 2021-08-08 ENCOUNTER — Encounter (HOSPITAL_COMMUNITY): Payer: Self-pay

## 2021-08-08 ENCOUNTER — Emergency Department (HOSPITAL_COMMUNITY)
Admission: EM | Admit: 2021-08-08 | Discharge: 2021-08-08 | Disposition: A | Discharge status: Home / Self Care | Payer: Self-pay | Attending: Emergency Medicine | Admitting: Emergency Medicine

## 2021-08-08 DIAGNOSIS — J029 Acute pharyngitis, unspecified: Secondary | ICD-10-CM | POA: Insufficient documentation

## 2021-08-08 DIAGNOSIS — J449 Chronic obstructive pulmonary disease, unspecified: Secondary | ICD-10-CM | POA: Insufficient documentation

## 2021-08-08 DIAGNOSIS — F17200 Nicotine dependence, unspecified, uncomplicated: Secondary | ICD-10-CM | POA: Insufficient documentation

## 2021-08-08 DIAGNOSIS — Z5321 Procedure and treatment not carried out due to patient leaving prior to being seen by health care provider: Secondary | ICD-10-CM | POA: Insufficient documentation

## 2021-08-08 DIAGNOSIS — M791 Myalgia, unspecified site: Secondary | ICD-10-CM | POA: Insufficient documentation

## 2021-08-08 LAB — CBC WITH DIFFERENTIAL/PLATELET
Abs Immature Granulocytes: 0.03 K/uL (ref 0.00–0.07)
Basophils Absolute: 0 K/uL (ref 0.0–0.1)
Basophils Relative: 1 %
Eosinophils Absolute: 0.3 K/uL (ref 0.0–0.5)
Eosinophils Relative: 3 %
HCT: 39.1 % (ref 36.0–46.0)
Hemoglobin: 11.6 g/dL — ABNORMAL LOW (ref 12.0–15.0)
Immature Granulocytes: 0 %
Lymphocytes Relative: 22 %
Lymphs Abs: 1.8 K/uL (ref 0.7–4.0)
MCH: 24 pg — ABNORMAL LOW (ref 26.0–34.0)
MCHC: 29.7 g/dL — ABNORMAL LOW (ref 30.0–36.0)
MCV: 80.8 fL (ref 80.0–100.0)
Monocytes Absolute: 0.4 K/uL (ref 0.1–1.0)
Monocytes Relative: 5 %
Neutro Abs: 5.5 K/uL (ref 1.7–7.7)
Neutrophils Relative %: 69 %
Platelets: 270 K/uL (ref 150–400)
RBC: 4.84 MIL/uL (ref 3.87–5.11)
RDW: 17.5 % — ABNORMAL HIGH (ref 11.5–15.5)
WBC: 8 K/uL (ref 4.0–10.5)
nRBC: 0 % (ref 0.0–0.2)

## 2021-08-08 LAB — BASIC METABOLIC PANEL WITH GFR
Anion gap: 8 (ref 5–15)
BUN: 7 mg/dL (ref 6–20)
CO2: 23 mmol/L (ref 22–32)
Calcium: 8.7 mg/dL — ABNORMAL LOW (ref 8.9–10.3)
Chloride: 108 mmol/L (ref 98–111)
Creatinine, Ser: 0.77 mg/dL (ref 0.44–1.00)
GFR, Estimated: >60 mL/min
Glucose, Bld: 105 mg/dL — ABNORMAL HIGH (ref 70–99)
Potassium: 3.6 mmol/L (ref 3.5–5.1)
Sodium: 139 mmol/L (ref 135–145)

## 2021-08-08 LAB — I-STAT BETA HCG BLOOD, ED (MC, WL, AP ONLY): I-stat hCG, quantitative: <5 m[IU]/mL

## 2021-08-08 LAB — TROPONIN I (HIGH SENSITIVITY): Troponin I (High Sensitivity): 7 ng/L

## 2021-08-08 MED ORDER — ALBUTEROL SULFATE HFA 108 (90 BASE) MCG/ACT IN AERS
2.0000 | INHALATION_SPRAY | Freq: Once | RESPIRATORY_TRACT | Status: AC
  Administered 2021-08-08: 2 via RESPIRATORY_TRACT
  Filled 2021-08-08: qty 6.7

## 2021-08-08 NOTE — ED Notes (Signed)
Pt left without being seen due to wait times.

## 2021-08-08 NOTE — ED Triage Notes (Signed)
Pt SOB X2 days, has COPD and continues to smoke.  Pt is wheezing

## 2021-08-08 NOTE — ED Provider Notes (Signed)
Emergency Medicine Provider Triage Evaluation Note  Alyssa [name] , a 45 y.o. female  was evaluated in triage.  Pt complains of sob. Began a few days ago. Works at nursing facility. Cough, hx of COPD, previous admission. Still smoking. Also with ST, CP, myalgias. Generalized weakness  Review of Systems  Positive: CP, SOB, cough, myalgias Negative: Fever, emesis, abd pain  Physical Exam  There were no vitals taken for this visit. Gen:   Awake, no distress   Resp:  Expiratory wheeze. Hoarse voice MSK:   Moves extremities without difficulty  Abd :  Soft Other:    Medical Decision Making  Medically screening exam initiated at 7:44 PM.  Appropriate orders placed.  Taylor ELIZETH WEINRICH was informed that the remainder of the evaluation will be completed by another provider, this initial triage assessment does not replace that evaluation, and the importance of remaining in the ED until their evaluation is complete.  SOB, cough, CP   Keshanna Riso A, PA-C 08/08/21 1952    Terald Sleeper, MD 08/09/21 [phone]

## 2021-10-18 ENCOUNTER — Ambulatory Visit (HOSPITAL_COMMUNITY)
Admission: EM | Admit: 2021-10-18 | Discharge: 2021-10-18 | Disposition: A | Discharge status: Home / Self Care | Payer: No Payment, Other | Attending: Psychiatry | Admitting: Psychiatry

## 2021-10-18 DIAGNOSIS — Z9151 Personal history of suicidal behavior: Secondary | ICD-10-CM | POA: Insufficient documentation

## 2021-10-18 DIAGNOSIS — F419 Anxiety disorder, unspecified: Secondary | ICD-10-CM | POA: Insufficient documentation

## 2021-10-18 DIAGNOSIS — F3132 Bipolar disorder, current episode depressed, moderate: Secondary | ICD-10-CM | POA: Insufficient documentation

## 2021-10-18 NOTE — BH Assessment (Signed)
Comprehensive Clinical Assessment (CCA) Screening, Triage and Referral Note  10/18/2021 VAIDE[facility] BRADDY [id number]  Disposition: Per Vernard Gambles, NP, patient is psychiatrically cleared and recommended for outpatient services.   Flowsheet Row ED from 10/18/2021 in [facility] ED from 08/08/2021 in [facility] EMERGENCY DEPARTMENT ED from 03/11/2021 in [facility] at [facility] RISK CATEGORY No Risk No Risk No Risk      The patient demonstrates the following risk factors for suicide: Chronic risk factors for suicide include: psychiatric disorder of Bipolar, previous suicide attempts last attempt in 2015, and medical illness COPD . Acute risk factors for suicide include: N/A. Protective factors for this patient include: responsibility to others (children, family) and hope for the future. Considering these factors, the overall suicide risk at this point appears to be low. Patient is appropriate for outpatient follow up.   Brandice Hornaday is a 45 year old female presenting to [facility] with chief complaint of needing to get back on her medications. Patient reports diagnosis of depression, anxiety, schizophrenia and multiple personality disorder. Patient reports she has been off her medications for 2.5 years and she denies having outpatient services. Patient reports AH of chattering sounds and reports a history of command hallucinations last heard in 2015 when she attempted suicide via OD on Seroquel. Patient denies SI, HI, VH and paranoia. Patient contracts for safety. 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 Patient is alert, engaged and cooperative during assessment. Patient eye contact and tone of voice is normal. Patient does not appear to be responding to internal/external stimuli and she does not appear manic or psychotic.   Chief  Complaint:  Chief Complaint  Patient presents with   Depression   Medication Refill   Visit Diagnosis: Bipolar affective disorder, currently depressed, moderate (HCC)  Patient Reported Information How did you hear about [place]? Self  What Is the Reason for Your Visit/Call Today? medications  How Long Has This Been Causing You Problems? > than 6 months  What Do You Feel Would Help You the Most Today? Treatment for Depression or other mood problem   Have You Recently Had Any Thoughts About Hurting Yourself? No  Are You Planning to Commit Suicide/Harm Yourself At This time? No   Have you Recently Had Thoughts About Hurting Someone Karolee Ohs? No  Are You Planning to Harm Someone at This Time? No  Explanation: No data recorded  Have You Used Any Alcohol or Drugs in the Past 24 Hours? No  How Long Ago Did You Use Drugs or Alcohol? No data recorded What Did You Use and How Much? No data recorded  Do You Currently Have a Therapist/Psychiatrist? No  Name of Therapist/Psychiatrist: No data recorded  Have You Been Recently Discharged From Any Office Practice or Programs? No  Explanation of Discharge From Practice/Program: No data recorded   CCA Screening Triage Referral Assessment Type of Contact: Face-to-Face  Telemedicine Service Delivery:   Is this Initial or Reassessment? No data recorded Date Telepsych consult ordered in [facility]:  No data recorded Time Telepsych consult ordered in [facility]:  No data recorded Location of Assessment: [facility] Assessment Services  Provider Location: [facility] Assessment Services   Collateral Involvement: none   Does Patient Have a Automotive engineer Guardian? No data recorded Name and Contact of Legal Guardian: No data recorded If Minor and Not Living with Parent(s),  Who has Custody? No data recorded Is CPS involved or ever been involved? No data recorded Is APS involved or ever been involved? No data recorded  Patient Determined To Be At Risk for  Harm To Self or Others Based on Review of Patient Reported Information or Presenting Complaint? No  Method: No data recorded Availability of Means: No data recorded Intent: No data recorded Notification Required: No data recorded Additional Information for Danger to Others Potential: No data recorded Additional Comments for Danger to Others Potential: No data recorded Are There Guns or Other Weapons in Your Home? No data recorded Types of Guns/Weapons: No data recorded Are These Weapons Safely Secured?                            No data recorded Who Could Verify You Are Able To Have These Secured: No data recorded Do You Have any Outstanding Charges, Pending Court Dates, Parole/Probation? No data recorded Contacted To Inform of Risk of Harm To Self or Others: No data recorded  Does Patient Present under Involuntary Commitment? No  IVC Papers Initial File Date: No data recorded  [place] of Residence: [place]   Patient Currently Receiving the Following Services: No data recorded  Determination of Need: Routine (7 days)   Options For Referral: Intensive Outpatient Therapy; Medication Management; Outpatient Therapy   Discharge Disposition:     Audree Camel, [facility]

## 2021-10-18 NOTE — Discharge Instructions (Addendum)

## 2021-10-18 NOTE — ED Notes (Signed)
Discharge instructions provided and Pt stated understanding. Pt alert, orient and ambulatory prior to d/c from facility. Personal belongings returned. Pt escorted to the front lobby to d/c home from the facility. Safety maintained.

## 2021-10-18 NOTE — ED Provider Notes (Signed)
Behavioral Health Urgent Care Medical Screening Exam  Patient Name: Alyssa [name] MRN: [id number] Date of Evaluation: 10/18/21 Chief Complaint:   Diagnosis:  Final diagnoses:  Bipolar affective disorder, currently depressed, moderate (HCC)    History of Present illness: Alyssa [name] is a 45 y.o. female patient presented to [facility] as a walk in alone with complaints of " I am ready to get back on my medications for depression and anxiety".  Alyssa [name], 45 y.o., female patient seen face to face by this provider, consulted with Dr. Bronwen Betters; and chart reviewed on 10/18/21.  On evaluation [name] reports she has a history of bipolar affective disorder, depression,anxiety, and substance abuse.  Denies any current alcohol or substance use, her last use was cocaine 6 months ago.  She lives with her sister and mother and is currently unemployed.  She has a history of one suicide attempt and multiple inpatient admissions in the past.  Last admission was in 2015.  She currently does not have a primary care physician or an outpatient psychiatric provider.Reports a medical history of copd and hypertension.  During evaluation [name] is in sitting position in no acute distress.She makes good eye contact and is fairly groomed.  She is alert/oriented x 4 and cooperative. She endorses anxiety and depression with a congruent affect. Endorses helplessness and hopelessness at times. Reports no concerns with sleep or appetite.  She is speaking in a clear tone at moderate volume, and normal pace. Her thought process is coherent and relevant; There is no indication that she is currently responding to internal/external stimuli or experiencing delusional thought content; and she has denied suicidal/self-harm/homicidal ideation and paranoia.  She denies visual hallucinations. She endorses auditory hallucinations states, "I just hear noises". Denies any command hallucinations. Patient answered questions appropriately.     Provided out patient psychiatric resources, including Behavorial Out patient services on the second floor and made a referral to PHP/IOP.  Psychiatric Specialty Exam  Presentation  General Appearance:Appropriate for Environment; Casual  Eye Contact:Good  Speech:Clear and Coherent; Normal Rate  Speech Volume:Normal  Handedness:Right   Mood and Affect  Mood:Anxious; Depressed  Affect:Congruent   Thought Process  Thought Processes:Coherent  Descriptions of Associations:Intact  Orientation:Full (Time, Place and Person)  Thought Content:Logical    Hallucinations:Auditory she hears noices  Ideas of Reference:None  Suicidal Thoughts:No  Homicidal Thoughts:No data recorded  Sensorium  Memory:Immediate Good; Recent Good; Remote Good  Judgment:Good  Insight:Good   Executive Functions  Concentration:Good  Attention Span:Good  Recall:Good  Fund of Knowledge:Good  Language:Good   Psychomotor Activity  Psychomotor Activity:Normal   Assets  Assets:Communication Skills; Desire for Improvement; Financial Resources/Insurance; Housing; Leisure Time; Physical Health; Resilience; Transportation   Sleep  Sleep:Good  Number of hours: No data recorded  No data recorded  Physical Exam: Physical Exam Vitals and nursing note reviewed.  Constitutional:      General: She is not in acute distress.    Appearance: Normal appearance. She is not ill-appearing.  HENT:     Head: Normocephalic.  Eyes:     General:        Right eye: No discharge.        Left eye: No discharge.     Conjunctiva/sclera: Conjunctivae normal.  Cardiovascular:     Rate and Rhythm: Normal rate.  Pulmonary:     Effort: Pulmonary effort is normal.  Musculoskeletal:        General: Normal range of motion.  Cervical back: Normal range of motion.  Skin:    General: Skin is dry.     Coloration: Skin is not jaundiced or pale.  Neurological:     Mental Status: She is alert and  oriented to person, place, and time.  Psychiatric:        Attention and Perception: She perceives auditory hallucinations.        Mood and Affect: Mood is anxious and depressed.        Speech: Speech normal.        Behavior: Behavior is cooperative.        Thought Content: Thought content normal.        Cognition and Memory: Cognition normal.        Judgment: Judgment normal.   Review of Systems  Constitutional: Negative.   HENT: Negative.    Eyes: Negative.   Respiratory: Negative.    Cardiovascular: Negative.   Genitourinary: Negative.   Musculoskeletal: Negative.   Skin: Negative.   Neurological: Negative.   Blood pressure (!) 150/103, pulse 76, temperature 98.2 F (36.8 C), temperature source Oral, resp. rate 18, SpO2 98 %. There is no height or weight on file to calculate BMI.  Musculoskeletal: Strength & Muscle Tone: within normal limits Gait & Station: normal Patient leans: N/A   [facility] MSE Discharge Disposition for Follow up and Recommendations: Based on my evaluation the patient does not appear to have an emergency medical condition and can be discharged with resources and follow up care in outpatient services for Medication Management and Partial Hospitalization Program  Discharge patient.   Provided out patient psychiatric resources, including Behavorial Out patient services on the second floor with open access walk in hours and made a referral to PHP/IOP.  No evidence of imminent risk to self or others at present.    Patient does not meet criteria for psychiatric inpatient admission. Discussed crisis plan, support from social network, calling 911, coming to the Emergency Department, and calling Suicide Hotline.   Ardis Hughs, NP 10/18/2021, 1:25 PM

## 2021-10-18 NOTE — BH Assessment (Signed)
Alyssa [name], Urgent, MRN #[id number]; 45 years old presents this date with her friend, Janelle Floor, [phone].  Pt denied SI  or AVH. Pt reports hearing voices and paranoia.  Pt admits to prior MH diagnosis ; also reports not currently taking prescribed medication for symptom management.  MSE signed by patient.

## 2021-10-18 NOTE — Progress Notes (Signed)
   10/18/21 1155  [facility] Triage Screening (Walk-ins at [facility] only)  How Did You Hear About [place]? Family/Friend  What Is the Reason for Your Visit/Call Today? Depression  How Long Has This Been Causing You Problems? 1 wk - 1 month  Have You Recently Had Any Thoughts About Hurting Yourself? No  Are You Planning to Commit Suicide/Harm Yourself At This time? No  Have you Recently Had Thoughts About Hurting Someone Karolee Ohs? No  Are You Planning To Harm Someone At This Time? No  Are you currently experiencing any auditory, visual or other hallucinations? Yes  Please explain the hallucinations you are currently experiencing: Paranoia, Hearing voices, "chatter"  Have You Used Any Alcohol or Drugs in the Past 24 Hours? No  Do you have any current medical co-morbidities that require immediate attention? No  Clinician description of patient physical appearance/behavior: cooperative  What Do You Feel Would Help You the Most Today? Treatment for Depression or other mood problem  If access to [facility] Urgent Care was not available, would you have sought care in the Emergency Department? Yes  Determination of Need Urgent (48 hours)  Options For Referral Medication Management

## 2021-10-19 ENCOUNTER — Telehealth (HOSPITAL_COMMUNITY): Payer: Self-pay | Admitting: Professional

## 2021-10-25 ENCOUNTER — Telehealth (HOSPITAL_COMMUNITY): Payer: Self-pay | Admitting: Emergency Medicine

## 2021-10-25 NOTE — BH Assessment (Signed)
Care Management - Follow Up [facility] Discharges   Writer made contact with the patient. Patient reports that she declined the PHP (Partial Hospitalization Program) due to child care needs with her grandchildren.   Patient reports that she has a follow up appt with Deloris Ping, LCSW on 12-24-2021.   Writer informed patient that she is able to come to the [facility] Mon thru Friday.  Writer reminded the patient that [facility] is first come first serve.  Therefore, she will need to come early.

## 2021-12-24 ENCOUNTER — Ambulatory Visit (INDEPENDENT_AMBULATORY_CARE_PROVIDER_SITE_OTHER): Payer: No Payment, Other | Admitting: Clinical

## 2021-12-24 DIAGNOSIS — F3130 Bipolar disorder, current episode depressed, mild or moderate severity, unspecified: Secondary | ICD-10-CM | POA: Diagnosis not present

## 2021-12-25 NOTE — Plan of Care (Signed)
Client was in agreement with the treatment plan. °
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
## 2021-12-30 ENCOUNTER — Ambulatory Visit (HOSPITAL_COMMUNITY): Payer: 59 | Admitting: Physician Assistant

## 2021-12-30 ENCOUNTER — Encounter (HOSPITAL_COMMUNITY): Payer: Self-pay

## 2022-02-24 ENCOUNTER — Ambulatory Visit (HOSPITAL_COMMUNITY): Payer: 59 | Admitting: Clinical

## 2022-02-24 ENCOUNTER — Encounter (HOSPITAL_COMMUNITY): Payer: Self-pay

## 2022-02-27 ENCOUNTER — Other Ambulatory Visit: Payer: Self-pay

## 2022-02-27 ENCOUNTER — Encounter (HOSPITAL_COMMUNITY): Payer: Self-pay | Admitting: Emergency Medicine

## 2022-02-27 ENCOUNTER — Emergency Department (HOSPITAL_COMMUNITY)
Admission: EM | Admit: 2022-02-27 | Discharge: 2022-03-02 | Disposition: A | Discharge status: Home / Self Care | Payer: 59 | Attending: Emergency Medicine | Admitting: Emergency Medicine

## 2022-02-27 DIAGNOSIS — J449 Chronic obstructive pulmonary disease, unspecified: Secondary | ICD-10-CM | POA: Diagnosis not present

## 2022-02-27 DIAGNOSIS — Z20822 Contact with and (suspected) exposure to covid-19: Secondary | ICD-10-CM | POA: Diagnosis not present

## 2022-02-27 DIAGNOSIS — R45851 Suicidal ideations: Secondary | ICD-10-CM | POA: Insufficient documentation

## 2022-02-27 DIAGNOSIS — I10 Essential (primary) hypertension: Secondary | ICD-10-CM | POA: Insufficient documentation

## 2022-02-27 DIAGNOSIS — F1994 Other psychoactive substance use, unspecified with psychoactive substance-induced mood disorder: Secondary | ICD-10-CM | POA: Diagnosis present

## 2022-02-27 DIAGNOSIS — Z79899 Other long term (current) drug therapy: Secondary | ICD-10-CM | POA: Insufficient documentation

## 2022-02-27 DIAGNOSIS — F332 Major depressive disorder, recurrent severe without psychotic features: Secondary | ICD-10-CM | POA: Diagnosis not present

## 2022-02-27 DIAGNOSIS — F1910 Other psychoactive substance abuse, uncomplicated: Secondary | ICD-10-CM | POA: Diagnosis present

## 2022-02-27 DIAGNOSIS — F1914 Other psychoactive substance abuse with psychoactive substance-induced mood disorder: Secondary | ICD-10-CM | POA: Diagnosis not present

## 2022-02-27 LAB — CBC
HCT: 47.7 % — ABNORMAL HIGH (ref 36.0–46.0)
Hemoglobin: 14.5 g/dL (ref 12.0–15.0)
MCH: 25.5 pg — ABNORMAL LOW (ref 26.0–34.0)
MCHC: 30.4 g/dL (ref 30.0–36.0)
MCV: 84 fL (ref 80.0–100.0)
Platelets: 253 10*3/uL (ref 150–400)
RBC: 5.68 MIL/uL — ABNORMAL HIGH (ref 3.87–5.11)
RDW: 18.1 % — ABNORMAL HIGH (ref 11.5–15.5)
WBC: 7.7 10*3/uL (ref 4.0–10.5)
nRBC: 0 % (ref 0.0–0.2)

## 2022-02-27 LAB — COMPREHENSIVE METABOLIC PANEL
ALT: 16 U/L (ref 0–44)
AST: 19 U/L (ref 15–41)
Albumin: 3.8 g/dL (ref 3.5–5.0)
Alkaline Phosphatase: 62 U/L (ref 38–126)
Anion gap: 9 (ref 5–15)
BUN: <5 mg/dL — ABNORMAL LOW (ref 6–20)
CO2: 26 mmol/L (ref 22–32)
Calcium: 9 mg/dL (ref 8.9–10.3)
Chloride: 102 mmol/L (ref 98–111)
Creatinine, Ser: 0.71 mg/dL (ref 0.44–1.00)
GFR, Estimated: >60 mL/min (ref >60)
Glucose, Bld: 165 mg/dL — ABNORMAL HIGH (ref 70–99)
Potassium: 3.5 mmol/L (ref 3.5–5.1)
Sodium: 137 mmol/L (ref 135–145)
Total Bilirubin: 0.8 mg/dL (ref 0.3–1.2)
Total Protein: 6.8 g/dL (ref 6.5–8.1)

## 2022-02-27 LAB — RESP PANEL BY RT-PCR (FLU A&B, COVID) ARPGX2
Influenza A by PCR: NEGATIVE
Influenza B by PCR: NEGATIVE
SARS Coronavirus 2 by RT PCR: NEGATIVE

## 2022-02-27 LAB — SALICYLATE LEVEL: Salicylate Lvl: <7 mg/dL — ABNORMAL LOW (ref 7.0–30.0)

## 2022-02-27 LAB — RAPID URINE DRUG SCREEN, HOSP PERFORMED
Amphetamines: NOT DETECTED
Barbiturates: NOT DETECTED
Benzodiazepines: NOT DETECTED
Cocaine: POSITIVE — AB
Opiates: NOT DETECTED
Tetrahydrocannabinol: NOT DETECTED

## 2022-02-27 LAB — ETHANOL: Alcohol, Ethyl (B): <10 mg/dL (ref <10)

## 2022-02-27 LAB — I-STAT BETA HCG BLOOD, ED (MC, WL, AP ONLY): I-stat hCG, quantitative: <5 m[IU]/mL (ref <5)

## 2022-02-27 LAB — ACETAMINOPHEN LEVEL: Acetaminophen (Tylenol), Serum: <10 ug/mL — ABNORMAL LOW (ref 10–30)

## 2022-02-27 MED ORDER — HYDROXYZINE HCL 25 MG PO TABS
25.0000 mg | ORAL_TABLET | ORAL | Status: DC | PRN
  Administered 2022-02-27 – 2022-03-02 (×6): 25 mg via ORAL
  Filled 2022-02-27 (×6): qty 1

## 2022-02-27 MED ORDER — NICOTINE 7 MG/24HR TD PT24
7.0000 mg | MEDICATED_PATCH | Freq: Once | TRANSDERMAL | Status: AC
  Administered 2022-02-27: 7 mg via TRANSDERMAL
  Filled 2022-02-27 (×2): qty 1

## 2022-02-27 MED ORDER — ACETAMINOPHEN 325 MG PO TABS
650.0000 mg | ORAL_TABLET | Freq: Once | ORAL | Status: AC
  Administered 2022-02-27: 650 mg via ORAL
  Filled 2022-02-27: qty 2

## 2022-02-27 MED ORDER — GABAPENTIN 100 MG PO CAPS
200.0000 mg | ORAL_CAPSULE | Freq: Three times a day (TID) | ORAL | Status: DC
  Administered 2022-02-27 – 2022-03-02 (×9): 200 mg via ORAL
  Filled 2022-02-27 (×9): qty 2

## 2022-02-27 NOTE — ED Triage Notes (Signed)
Patient with schizophrenia here with complaint of auditory hallucinations and suicidal ideation, denies homicidal ideation. Patient states voices are telling her to kill her self by taking too many pills, reports she has schizophrenia but has not been medicated in more than a year. Patient is calm and cooperative at this time. ?

## 2022-02-27 NOTE — ED Notes (Signed)
Received patient in no acute distress breathing spontaneously to room air . Patient is on 1:1 observation for sucidal ideation . Patient is requesting meds for her anxiety and COPD. MD Freida Busman made aware . Patient is in no acute distress.  ?

## 2022-02-27 NOTE — ED Notes (Signed)
Pt has been changed out into purple scrubs and all personal items have been put into belongings bags. There are 2 belongings bags in locker #4 in purple zone. Security has been called to wand pt.  ?

## 2022-02-27 NOTE — ED Notes (Signed)
Pt has been wanded by security. 

## 2022-02-27 NOTE — BH Assessment (Signed)
TTS spoke Mindi Junker, to put pt in a private room to complete TTS assessment.  Clinician to call the cart. ?

## 2022-02-27 NOTE — ED Provider Notes (Signed)
?[facility] EMERGENCY DEPARTMENT ?Provider Note ? ? ?CSN: [id number] ?Arrival date & time: 02/27/22  4503 ? ?  ? ?History ? ?Chief Complaint  ?Patient presents with  ? Suicidal  ? ? ?Alyssa [name] is a 46 y.o. female. ? ?HPI ?46 year old female with a history of hypertension, COPD, bipolar disorder, schizophrenia, fibromyalgia presents to the ER with complaints of suicidal ideation.  Patient states that she plans to overdose of pills.  She states that she has relapsed on her drug use, states that she uses cocaine and opioids.  She also endorses daily alcohol use, states she will drink beer or malt liquor depending on what she "can get her hands on".  She has been hearing voices that have been telling her to hurt herself.  She does state that she has 2 granddaughters that depend on her.  She denies any homicidal ideation.  Denies any visual hallucinations. ?  ? ?Home Medications ?Prior to Admission medications   ?Medication Sig Start Date End Date Taking? Authorizing Provider  ?acetaminophen (TYLENOL) 500 MG tablet Take 2 tablets (1,000 mg total) by mouth every 6 (six) hours as needed. 02/03/20   Arby Barrette, MD  ?albuterol (VENTOLIN HFA) 108 (90 Base) MCG/ACT inhaler Inhale 1-2 puffs into the lungs every 6 (six) hours as needed for wheezing or shortness of breath. 03/11/21   Rhys Martini, PA-C  ?azithromycin (ZITHROMAX Z-PAK) 250 MG tablet Take 2 tablets (500 mg) on  Day 1,  followed by 1 tablet (250 mg) once daily on Days 2 through 5. 09/26/20   Sharman Cheek, MD  ?cephALEXin (KEFLEX) 500 MG capsule Take 1 capsule (500 mg total) by mouth 4 (four) times daily. 03/11/21   Rhys Martini, PA-C  ?cetirizine (ZYRTEC ALLERGY) 10 MG tablet Take 1 tablet (10 mg total) by mouth daily. 03/11/21   Rhys Martini, PA-C  ?gabapentin (NEURONTIN) 100 MG capsule Take 1 capsule (100 mg total) by mouth 3 (three) times daily for 10 days. 08/21/18 08/31/18  Chinita Pester, FNP  ?lisinopril (ZESTRIL) 5 MG  tablet Take 1 tablet (5 mg total) by mouth daily. 02/03/20   Arby Barrette, MD  ?methocarbamol (ROBAXIN) 500 MG tablet Take 1 tablet (500 mg total) by mouth every 6 (six) hours as needed for muscle spasms. 02/03/20   Arby Barrette, MD  ?predniSONE (STERAPRED UNI-PAK 21 TAB) 10 MG (21) TBPK tablet Take by mouth daily. Take 6 tabs by mouth daily  for 2 days, then 5 tabs for 2 days, then 4 tabs for 2 days, then 3 tabs for 2 days, 2 tabs for 2 days, then 1 tab by mouth daily for 2 days 03/11/21   Rhys Martini, PA-C  ?Fluticasone-Salmeterol (ADVAIR DISKUS) 250-50 MCG/DOSE AEPB Inhale 1 puff into the lungs 2 (two) times daily. IM Program ?Patient not taking: No sig reported 01/04/18 03/11/21  Scherrie Gerlach, MD  ?omeprazole (PRILOSEC) 40 MG capsule Take 1 capsule (40 mg total) by mouth daily. IM Program ?Patient not taking: No sig reported 01/04/18 03/11/21  Scherrie Gerlach, MD  ?   ? ?Allergies    ?Patient has no known allergies.   ? ?Review of Systems   ?Review of Systems ?Ten systems reviewed and are negative for acute change, except as noted in the HPI.  ? ? ?Physical Exam ?Updated Vital Signs ?BP (!) 174/93 (BP Location: Right Arm)   Pulse 80   Temp 97.6 ?F (36.4 ?C) (Oral)   Resp 20   SpO2 100%  ?  Physical Exam ?Vitals and nursing note reviewed.  ?Constitutional:   ?   General: She is not in acute distress. ?   Appearance: She is well-developed.  ?HENT:  ?   Head: Normocephalic and atraumatic.  ?Eyes:  ?   Conjunctiva/sclera: Conjunctivae normal.  ?Cardiovascular:  ?   Rate and Rhythm: Normal rate and regular rhythm.  ?   Heart sounds: No murmur heard. ?Pulmonary:  ?   Effort: Pulmonary effort is normal. No respiratory distress.  ?   Breath sounds: Normal breath sounds.  ?Abdominal:  ?   Palpations: Abdomen is soft.  ?   Tenderness: There is no abdominal tenderness.  ?Musculoskeletal:     ?   General: No swelling.  ?   Cervical back: Neck supple.  ?Skin: ?   General: Skin is warm and dry.  ?   Capillary Refill:  Capillary refill takes less than 2 seconds.  ?Neurological:  ?   Mental Status: She is alert.  ?Psychiatric:     ?   Mood and Affect: Mood normal.  ?   Comments: Tearful affect, calm cooperative  ? ? ?ED Results / Procedures / Treatments   ?Labs ?(all labs ordered are listed, but only abnormal results are displayed) ?Labs Reviewed  ?COMPREHENSIVE METABOLIC PANEL - Abnormal; Notable for the following components:  ?    Result Value  ? Glucose, Bld 165 (*)   ? BUN <5 (*)   ? All other components within normal limits  ?SALICYLATE LEVEL - Abnormal; Notable for the following components:  ? Salicylate Lvl <7.0 (*)   ? All other components within normal limits  ?ACETAMINOPHEN LEVEL - Abnormal; Notable for the following components:  ? Acetaminophen (Tylenol), Serum <10 (*)   ? All other components within normal limits  ?CBC - Abnormal; Notable for the following components:  ? RBC 5.68 (*)   ? HCT 47.7 (*)   ? MCH 25.5 (*)   ? RDW 18.1 (*)   ? All other components within normal limits  ?RAPID URINE DRUG SCREEN, HOSP PERFORMED - Abnormal; Notable for the following components:  ? Cocaine POSITIVE (*)   ? All other components within normal limits  ?ETHANOL  ?I-STAT BETA HCG BLOOD, ED ([facility], [facility], [facility] ONLY)  ? ? ?EKG ?None ? ?Radiology ?No results found. ? ?Procedures ?Procedures  ? ? ?Medications Ordered in ED ?Medications - No data to display ? ?ED Course/ Medical Decision Making/ A&P ?  ?                        ?Medical Decision Making ?Amount and/or Complexity of Data Reviewed ?Labs: ordered. ? ? ?Patient presents to the ER with complaints of suicidal ideation, requiring medical current clearance for evaluation by psychiatry.  On presentation, the patient is tearful, calm, cooperative.  Vitals personally reviewed by me, overall reassuring.  I personally reviewed his lab work, which did not show any significant abnormalities.  CBC without leukocytosis, normal hemoglobin.  CMP without electrolyte abnormalities, normal  BUN/creatinine.  Negative acetaminophen, salicylate, ethanol.  UDS positive for cocaine.  She has been medically cleared for further evaluation by TTS.  Dispo according to the recommendation.  ?Final Clinical Impression(s) / ED Diagnoses ?Final diagnoses:  ?Suicidal ideation  ? ? ?Rx / DC Orders ?ED Discharge Orders   ? ? None  ? ?  ? ? ?  ?Mare Ferrari, PA-C ?02/27/22 1118 ? ?  ?Tegeler, Canary Brim, MD ?02/27/22 1359 ? ?

## 2022-02-27 NOTE — BH Assessment (Signed)
Comprehensive Clinical Assessment (CCA) Note ? ?02/27/2022 ?Alyssa [name] ?[id number] ? ?Chief Complaint:  ?Chief Complaint  ?Patient presents with  ? Suicidal  ? ?Visit Diagnosis:   ?F33.2 Major depressive disorder, Recurrent episode, Severe ?F14.20 Cocaine use disorder, Severe ? ?Flowsheet Row ED from 02/27/2022 in [facility] EMERGENCY DEPARTMENT ED from 10/18/2021 in [facility] ED from 08/08/2021 in [facility] EMERGENCY DEPARTMENT  ?C-SSRS RISK CATEGORY High Risk No Risk No Risk  ? ?  ? ?The patient demonstrates the following risk factors for suicide: Chronic risk factors for suicide include: psychiatric disorder of bipolar disorder and major depressive disorder, substance use disorder, previous suicide attempts overdosing, and completed suicide in a family member. Acute risk factors for suicide include: family or marital conflict, unemployment, and social withdrawal/isolation. Protective factors for this patient include: positive therapeutic relationship, responsibility to others (children, family), coping skills, and hope for the future. Considering these factors, the overall suicide risk at this point appears to be high. Patient is not appropriate for outpatient follow up. ? ?High risk = 1:1 sitter ? ?Disposition ?Alyssa Shoulder NP, recommends restart medication, overnight observation and to be reassessed by psychiatry at [facility]. Disposition discussed with Alyssa [name].  RN to discuss with EDP. ? ?Alyssa [name] is a 46 year old female who presents voluntarily to [facility] and unaccompanied.  Pt reports SI with  plan to overdose. Pt denies HI.  Pt reports that she is hearing voices that have been telling her to hurt herself and you can't do nothing".  Pt reports experiencing paranoia, " I feel that people are talking about me or plotting against me".  Pt acknowledged the following symptoms: isolation, worried, hopelessness, aggressive, guilt, fatigue, anxious, and  worthlessness.  Pt reports that she have missed three days of sleep; also reports that she have been eating once a day.  Pt admitted to a relapse; says she has been drinking alcohol daily; also, reports using crack cocaine and pills daily. ? 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  Pt reports that she is currently unemployed. Pt reports family history of mental illness.  Pt reports family history of substance used; also reports she had a cousin to die from an overdose.  Pt denies any history of abuse or trauma.  Pt denies any current legal problems.  Pt denies any guns or weapons in the home. ? ?Pt says she is not currently receiving weekly outpatient therapy; also is not receiving outpatient medication management.  Pt reports one previous inpatient psychiatric hospitalization in 2015. ? ?Pt is dressed in scrubs, alert, oriented x 4 with normal speech and rustless motor behavior.  Eye contact is normal and Pt is tearful.  Pt mood is depressed and affect depressed.  Thought process is coherent.  Pt's insight is good and judgment is  impaired.  There is no indication Pt is currently responding to internal stimuli or experiencing delusional thought content.  Pt was cooperative throughout assessment. ? ? ? ? ? ? ? ? ? ? ?CCA Screening, Triage and Referral (STR) ? ?Patient Reported Information ?How did you hear about [place]? Self ? ?What Is the Reason for Your Visit/Call Today? SI ? ?How Long Has This Been Causing You Problems? 1 wk - 1 month ? ?What Do You Feel Would  Help You the Most Today? Alcohol or Drug Use Treatment; Treatment for Depression or other mood problem ? ? ?Have You Recently Had Any Thoughts About Hurting Yourself? Yes ? ?Are You Planning to Commit Suicide/Harm Yourself At This time? Yes ? ? ?Have you Recently Had Thoughts About [place]?  No ? ?Are You Planning to Harm Someone at This Time? No ? ?Explanation: No data recorded ? ?Have You Used Any Alcohol or Drugs in the Past 24 Hours? Yes ? ?How Long Ago Did You Use Drugs or Alcohol? No data recorded ?What Did You Use and How Much? Crack Cocaine, Alcohol, Marijuana ? ? ?Do You Currently Have a Therapist/Psychiatrist? No ? ?Name of Therapist/Psychiatrist: No data recorded ? ?Have You Been Recently Discharged From Any Office Practice or Programs? No ? ?Explanation of Discharge From Practice/Program: No data recorded ? ?  ?CCA Screening Triage Referral Assessment ?Type of Contact: Tele-Assessment ? ?Telemedicine Service Delivery: Telemedicine service delivery: This service was provided via telemedicine using a 2-way, interactive audio and video technology ? ?Is this Initial or Reassessment? Initial Assessment ? ?Date Telepsych consult ordered in [facility]:  02/27/22 ? ?Time Telepsych consult ordered in [facility]:  No data recorded ?Location of Assessment: [facility] ED ? ?Provider Location: [facility] Assessment Services ? ? ?Collateral Involvement: No collateral involved. ? ? ?Does Patient Have a Stage manager Guardian? No data recorded ?Name and Contact of Legal Guardian: No data recorded ?If Minor and Not Living with Parent(s), Who has Custody? n/a ? ?Is CPS involved or ever been involved? Never ? ?Is APS involved or ever been involved? Never ? ? ?Patient Determined To Be At Risk for Harm To Self or Others Based on Review of Patient Reported Information or Presenting Complaint? Yes, for Self-Harm ? ?Method: No data recorded ?Availability of Means: No data recorded ?Intent: No data recorded ?Notification Required: No data recorded ?Additional Information for Danger to Others Potential: No data recorded ?Additional Comments for Danger to Others Potential: No data recorded ?Are There Guns or Other Weapons in [place]? No data recorded ?Types of Guns/Weapons: No data recorded ?Are These Weapons Safely Secured?                             No data recorded ?Who Could Verify You Are Able To Have These Secured: No data recorded ?Do You Have any Outstanding Charges, Pending Court Dates, Parole/Probation? No data recorded ?Contacted To Inform of Risk of Harm To Self or Others: Unable to Contact: ? ? ? ?Does Patient Present under Involuntary Commitment? No ? ?IVC Papers Initial File Date: No data recorded ? ?[place] of Residence: Other (Comment) Luanna Salk) ? ? ?Patient Currently Receiving the Following Services: No data recorded ? ?Determination of Need: Urgent (48 hours) ? ? ?Options For Referral: Chemical Dependency Intensive Outpatient Therapy (CDIOP); Medication Management; Facility-Based Crisis ? ? ? ? ?CCA Biopsychosocial ?Patient Reported Schizophrenia/Schizoaffective Diagnosis in Past: No ? ? ?Strengths: Asking for help ? ? ?Mental Health Symptoms ?Depression:   ?Change in energy/activity; Sleep (too much or little); Difficulty Concentrating; Tearfulness; Hopelessness; Fatigue; Worthlessness; Irritability ?  ?Duration of Depressive symptoms:  ?Duration of Depressive Symptoms: Greater than two weeks ?  ?Mania:   ?None ?  ?Anxiety:    ?Sleep; Worrying; Tension; Restlessness; Irritability ?  ?Psychosis:   ?None ?  ?Duration of Psychotic symptoms:    ?Trauma:   ?None ?  ?Obsessions:   ?Recurrent & persistent thoughts/impulses/images; Poor  insight; Disrupts routine/functioning ?  ?Compulsions:   ?Repeated behaviors/mental acts; Disrupts with routine/functioning ?  ?Inattention:   ?None ?  ?Hyperactivity/Impulsivity:   ?None ?  ?Oppositional/Defiant Behaviors:   ?None ?  ?Emotional Irregularity:   ?None ?  ?Other Mood/Personality Symptoms:   ?Depressed/irritable ?  ? ?Mental Status Exam ?Appearance and self-care  ?Stature:   ?Average ?  ?Weight:   ?Average weight ?  ?Clothing:   ?-- (Pt dressed in scrubs) ?  ?Grooming:   ?Normal ?  ?Cosmetic use:   ?Age appropriate ?  ?Posture/gait:   ?Normal ?  ?Motor activity:   ?Restless ?   ?Sensorium  ?Attention:   ?Normal ?  ?Concentration:   ?Normal ?  ?Orientation:   ?X5 ?  ?Recall/memory:   ?Normal ?  ?Affect and Mood  ?Affect:   ?Depressed ?  ?Mood:   ?Depressed ?  ?Relating  ?Eye contact

## 2022-02-27 NOTE — ED Notes (Signed)
Mother Durwin Glaze [phone] would like an update asap ?

## 2022-02-28 ENCOUNTER — Other Ambulatory Visit: Payer: Self-pay

## 2022-02-28 ENCOUNTER — Emergency Department (HOSPITAL_COMMUNITY): Payer: 59

## 2022-02-28 ENCOUNTER — Encounter (HOSPITAL_COMMUNITY): Payer: Self-pay | Admitting: Registered Nurse

## 2022-02-28 DIAGNOSIS — F1994 Other psychoactive substance use, unspecified with psychoactive substance-induced mood disorder: Secondary | ICD-10-CM | POA: Diagnosis present

## 2022-02-28 DIAGNOSIS — R45851 Suicidal ideations: Secondary | ICD-10-CM

## 2022-02-28 DIAGNOSIS — F1910 Other psychoactive substance abuse, uncomplicated: Secondary | ICD-10-CM

## 2022-02-28 MED ORDER — OLANZAPINE 5 MG PO TBDP
5.0000 mg | ORAL_TABLET | Freq: Every day | ORAL | Status: DC
  Administered 2022-02-28 – 2022-03-01 (×2): 5 mg via ORAL
  Filled 2022-02-28 (×2): qty 1

## 2022-02-28 MED ORDER — ALBUTEROL SULFATE HFA 108 (90 BASE) MCG/ACT IN AERS
2.0000 | INHALATION_SPRAY | Freq: Once | RESPIRATORY_TRACT | Status: AC
  Administered 2022-02-28: 2 via RESPIRATORY_TRACT
  Filled 2022-02-28: qty 6.7

## 2022-02-28 MED ORDER — PENICILLIN V POTASSIUM 250 MG PO TABS
500.0000 mg | ORAL_TABLET | Freq: Four times a day (QID) | ORAL | Status: DC
  Administered 2022-02-28 – 2022-03-02 (×9): 500 mg via ORAL
  Filled 2022-02-28 (×13): qty 2

## 2022-02-28 NOTE — ED Notes (Signed)
Pt ambulated to bathroom independently with steady gait. Patient back in bed at this time resting comfortably.  ?

## 2022-02-28 NOTE — ED Provider Notes (Signed)
11:07 AM ?Patient still awaiting psychiatric recommendations however she was complaining of some more wheezing with her known COPD.  She does say she had some cough recently and thinks he may have bronchitis.  We agreed to get chest x-ray as her lungs did have some rhonchi and wheezing on my examination.  We will give her some albuterol and reassess. ? ?She does not appear in any distress or appearing to have a COPD exacerbation.  ? ?Patient is also complaining of chronic right upper jaw dental pain where she has had a cracked tooth before.  She says she has had dental infection and reports she was having some increased discomfort and tasting some purulence.  On my evaluation and exam, I did not see a drainable dental abscess but due to her symptoms, I do suspect a dental infection.  We will give penicillin VK while she is here and if she is able to be discharged, she will need a prescription for penicillin to go home with and follow-up with dentistry.  Patient agrees with this plan. ? ?Anticipate reassessment after albuterol and x-ray. ?  ?Betsabe Iglesia, Canary Brim, MD ?02/28/22 1536 ? ?

## 2022-02-28 NOTE — ED Notes (Signed)
Placed breakfast order ?

## 2022-02-28 NOTE — Consult Note (Signed)
Telepsych Consultation  ? ?Reason for Consult:  Worsening depression, suicidal ideation ?Referring Physician:  Mare FerrariBelaya, Maria A, PA-C ?[facility] ?Location of Provider: Other: [facility] ? ?Patient Identification: Alyssa [name] ?MRN:  [id number] ?Principal Diagnosis: Substance induced mood disorder (HCC) ?Diagnosis:  Principal Problem: ?  Substance induced mood disorder (HCC) ?Active Problems: ?  Polysubstance abuse (HCC) ? ? ?Total Time spent with patient: 30 minutes ? ?Subjective:   ?Alyssa [name] is a 46 y.o. female patient [facility] after presenting with complaints of auditory hallucinations and suicidal ideation. ? ?HPI:  Alyssa [name], 46 y.o., female patient seen via tele health by this provider, consulted with Dr. Nelly RoutArchana Kumar; and chart reviewed on 02/28/22.  On evaluation Alyssa [name] reports she came to the hospital related to worsening depression, suicidal ideation with plan to overdose, and auditory hallucinations with voices telling her to kill herself and that she is worthless.  Patient also states that she uses cocaine and alcohol at least 4 times a week and her last use was yesterday.  Patient endorse paranoia feeling that people are out to get her and always watching her.  Patient is unable to contract for safety.  Will continue to recommend inpatient psychiatric treatment.  ?During evaluation Alyssa [name] is sitting up in chair in no acute distress.  Alyssa [name] alert, oriented x 4, calm and cooperative.  Her mood is depressed and tearful with congruent affect.  Alyssa [name] does not appear to be responding to internal/external stimuli or delusional thoughts; but endorses auditory hallucinations. Patient denies homicidal ideation.  Patient answered question appropriately. ? ?Past Psychiatric History: See below ? ?Risk to Self:   ?Risk to Others:   ?Prior Inpatient Therapy:   ?Prior Outpatient Therapy:   ? ?Past Medical History:  ?Past Medical History:  ?Diagnosis Date  ? Active smoker   ? Anxiety    ? Asthma   ? Bipolar affective (HCC)   ? Bipolar disorder (HCC)   ? Chronic back pain   ? COPD (chronic obstructive pulmonary disease) (HCC)   ? DJD (degenerative joint disease)   ? Fibromyalgia   ? HLD (hyperlipidemia)   ? HTN (hypertension)   ? ACEI cough   ? Hypertension   ? Low back pain   ? 2 ruptured discs in lower back   ? LVH (left ventricular hypertrophy)   ? echo (7/11) showed EF 70%, mild LVH, no sig valvular disease, moderate pericardial effussion. echo (9/11) showed no pericardial effusion, RF 65%, mild MR, normal RV size and systolic function.   ? Osteoporosis   ? Sleep apnea   ?  ?Past Surgical History:  ?Procedure Laterality Date  ? bilateral hand surgery    ? CARPAL TUNNEL RELEASE    ? CESAREAN SECTION    ? x3   ? CESAREAN SECTION    ? NOSE SURGERY    ? TUBAL LIGATION    ? tubligation    ? ?Family History:  ?Family History  ?Problem Relation Age of Onset  ? Coronary artery disease Other   ?     family hx  ? ?Family Psychiatric  History: None reported ?Social History:  ?Social History  ? ?Substance and Sexual Activity  ?Alcohol Use Yes  ? Comment: Twice a month.   ?   ?Social History  ? ?Substance and Sexual Activity  ?Drug Use No  ? Comment: used to use cocaine - last use 10/10  ?  ?Social History  ? ?  Socioeconomic History  ? Marital status: Divorced  ?  Spouse name: Not on file  ? Number of children: Not on file  ? Years of education: Not on file  ? Highest education level: Not on file  ?Occupational History  ? Not on file  ?Tobacco Use  ? Smoking status: Every Day  ?  Packs/day: 0.50  ?  Types: Cigarettes  ? Smokeless tobacco: Never  ?Substance and Sexual Activity  ? Alcohol use: Yes  ?  Comment: Twice a month.   ? Drug use: No  ?  Comment: used to use cocaine - last use 10/10  ? Sexual activity: Yes  ?  Birth control/protection: Surgical  ?Other Topics Concern  ? Not on file  ?Social History Narrative  ? ** Merged History Encounter **  ?    ? Single; lives in [place] with BF and 3 boys.   ?  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   ? ?Social Determinants of Health  ? ?Financial Resource Strain: Not on file  ?Food Insecurity: Not on file  ?Transportation Needs: Not on file  ?Physical Activity: Not on file  ?Stress: Not on file  ?Social Connections: Not on file  ? ?Additional Social History: ?  ? ?Allergies:  No Known Allergies ? ?Labs:  ?Results for orders placed or performed during the hospital encounter of 02/27/22 (from the past 48 hour(s))  ?Comprehensive metabolic panel     Status: Abnormal  ? Collection Time: 02/27/22  9:57 AM  ?Result Value Ref Range  ? Sodium 137 135 - 145 mmol/L  ? Potassium 3.5 3.5 - 5.1 mmol/L  ? Chloride 102 98 - 111 mmol/L  ? CO2 26 22 - 32 mmol/L  ? Glucose, Bld 165 (H) 70 - 99 mg/dL  ?  Comment: Glucose reference range applies only to samples taken after fasting for at least 8 hours.  ? BUN <5 (L) 6 - 20 mg/dL  ? Creatinine, Ser 0.71 0.44 - 1.00 mg/dL  ? Calcium 9.0 8.9 - 10.3 mg/dL  ? Total Protein 6.8 6.5 - 8.1 g/dL  ? Albumin 3.8 3.5 - 5.0 g/dL  ? AST 19 15 - 41 U/L  ? ALT 16 0 - 44 U/L  ? Alkaline Phosphatase 62 38 - 126 U/L  ? Total Bilirubin 0.8 0.3 - 1.2 mg/dL  ? GFR, Estimated >60 >60 mL/min  ?  Comment: (NOTE) ?Calculated using the CKD-EPI Creatinine Equation (2021) ?  ? Anion gap 9 5 - 15  ?  Comment: Performed at [facility], [address] [place], [place] [address]  ?Ethanol     Status: None  ? Collection Time: 02/27/22  9:57 AM  ?Result Value Ref Range  ? Alcohol, Ethyl (B) <10 <10 mg/dL  ?  Comment: (NOTE) ?Lowest detectable limit for serum alcohol is 10 mg/dL. ? ?For medical purposes only. ?Performed at [facility], [address] [place], [place] ?[address] ?  ?Salicylate level     Status: Abnormal  ? Collection Time: 02/27/22  9:57 AM  ?Result Value Ref Range  ? Salicylate Lvl <7.0 (L) 7.0 - 30.0 mg/dL  ?  Comment: Performed at [facility], [address], [place], [place] [address]  ?Acetaminophen level     Status: Abnormal  ?  Collection Time: 02/27/22  9:57 AM  ?Result Value Ref Range  ? Acetaminophen (Tylenol), Serum <10 (L) 10 - 30 ug/mL  ?  Comment: (NOTE) ?Therapeutic concentrations vary significantly. A  range of 10-30 ug/mL  ?may be an effective concentration for many patients. However, some  ?are best treated at concentrations outside of this range. ?Acetaminophen concentrations >150 ug/mL at 4 hours after ingestion  ?and >50 ug/mL at 12 hours after ingestion are often associated with  ?toxic reactions. ? ?Performed at [facility], [address], [place], [place] ?[address] ?  ?cbc     Status: Abnormal  ? Collection Time: 02/27/22  9:57 AM  ?Result Value Ref Range  ? WBC 7.7 4.0 - 10.5 K/uL  ? RBC 5.68 (H) 3.87 - 5.11 MIL/uL  ? Hemoglobin 14.5 12.0 - 15.0 g/dL  ? HCT 47.7 (H) 36.0 - 46.0 %  ? MCV 84.0 80.0 - 100.0 fL  ? MCH 25.5 (L) 26.0 - 34.0 pg  ? MCHC 30.4 30.0 - 36.0 g/dL  ? RDW 18.1 (H) 11.5 - 15.5 %  ? Platelets 253 150 - 400 K/uL  ? nRBC 0.0 0.0 - 0.2 %  ?  Comment: Performed at [facility], [address] [place], [place] [address]  ?Rapid urine drug screen (hospital performed)     Status: Abnormal  ? Collection Time: 02/27/22  9:59 AM  ?Result Value Ref Range  ? Opiates NONE DETECTED NONE DETECTED  ? Cocaine POSITIVE (A) NONE DETECTED  ? Benzodiazepines NONE DETECTED NONE DETECTED  ? Amphetamines NONE DETECTED NONE DETECTED  ? Tetrahydrocannabinol NONE DETECTED NONE DETECTED  ? Barbiturates NONE DETECTED NONE DETECTED  ?  Comment: (NOTE) ?DRUG SCREEN FOR MEDICAL PURPOSES ?ONLY.  IF CONFIRMATION IS NEEDED ?FOR ANY PURPOSE, NOTIFY LAB ?WITHIN 5 DAYS. ? ?LOWEST DETECTABLE LIMITS ?FOR URINE DRUG SCREEN ?Drug Class                     Cutoff (ng/mL) ?Amphetamine and metabolites    1000 ?Barbiturate and metabolites    200 ?Benzodiazepine                 200 ?Tricyclics and metabolites     300 ?Opiates and metabolites        300 ?Cocaine and metabolites        300 ?THC                            50 ?Performed at  [facility], [address] [place], [place] ?[address] ?  ?I-Stat beta hCG blood, ED     Status: None  ? Collection Time: 02/27/22 10:14 AM  ?Result Value Ref Range  ? I-stat hCG, quantitative <5.0 <5 m

## 2022-02-28 NOTE — ED Notes (Signed)
Patient is asleep in no acute distress breathing spontaneously to room air . 1:1 observation maintained .  ?

## 2022-02-28 NOTE — ED Notes (Signed)
Pt awake and alert and oriented at this time. Pt is c/o a dental abscess, COPD and possibly having bronchitis. Pt states "my COPD is acting up and I feel like I cannot catch my breath. I also have a cough and I am afraid I have bronchitis. Did anyone tell you that or the doctor because I told several people yesterday." This RN explained that this was the fist she heard of the complaints but would let the EDP know. EDP came to the bedside and saw pt and will place orders. Will continue to monitor.   ?

## 2022-02-28 NOTE — ED Notes (Signed)
Patient is asleep in no acute distress breathing spontaneously to room air. All safety measures maintained .1:1 observation maintained .  ?

## 2022-02-28 NOTE — ED Notes (Signed)
Patient is asleep in no acute distress breathing spontaneously to room air . 1:1 observation maintained .  ?

## 2022-03-01 MED ORDER — NICOTINE 7 MG/24HR TD PT24
7.0000 mg | MEDICATED_PATCH | Freq: Every day | TRANSDERMAL | Status: DC
  Administered 2022-03-01 – 2022-03-02 (×2): 7 mg via TRANSDERMAL
  Filled 2022-03-01 (×3): qty 1

## 2022-03-01 NOTE — ED Notes (Signed)
Pt moved to Purple zone rm 51 ?

## 2022-03-01 NOTE — Progress Notes (Signed)
Patient has been faxed out per the request of Dr. Lucianne Muss. Patient meets [facility] inpatient criteria per Assunta Found, NP. Patient has been faxed out to the following facilities:  ? ?[facility]  [address] [place] [address] [phone]  ?[facility]  [address] [place] [place] [address] [phone]  ?[facility]  [address], [place] [address] [phone]  ?[facility]  [name], [place] [address] [phone]  ?[facility]  [address] [place] [address] [phone]  ?[facility]  [address], [place] [address] [phone]  ?[facility]  [address] [place], [place] [address] [phone]  ?[facility]  [address] [place], [place] [address] [phone]  ?[facility]  [address] [place] [address] [phone]  ?[facility]  [address] [place] [address] [phone]  ?[facility]  [address] [place] [address] [phone]  ?[facility]  [address], [place] [address] [phone]  ?[facility]  [address] [place] [address] [phone]  ?  ?Damita Dunnings, MSW, LCSW-A  ?3:30 PM 03/01/2022   ?

## 2022-03-01 NOTE — ED Notes (Signed)
Pt given a snack and a drink 

## 2022-03-01 NOTE — ED Notes (Signed)
Pt's dinner has arrived.

## 2022-03-01 NOTE — Progress Notes (Addendum)
Pt was accepted to [facility] 03/02/22: Bed Assignment-[facility] after 8:00am ? ?Pt meets inpatient criteria per Dr. Lucianne Muss ? ?Attending Physician will be Dr. Estill Cotta ? ?Report can be called to: [phone] or [phone] ? ?Pt can arrive after 8:00am on 03/02/22 ? ?Care Team notified: Shuronia Pflueger, RN, and Assunta Found, NP. ? ?Kelton Pillar, LCSWA ?03/01/2022 @ 7:13 PM ? ?

## 2022-03-01 NOTE — ED Provider Notes (Signed)
Emergency Medicine Observation Re-evaluation Note ? ?Alyssa [name] is a 46 y.o. female, seen on rounds today.  Pt initially presented to the ED for complaints of Suicidal ?Currently, the patient is resting in bed. ? ?Physical Exam  ?BP 133/90 (BP Location: Right Arm)   Pulse 76   Temp 98.5 ?F (36.9 ?C) (Oral)   Resp 18   SpO2 94%  ?Physical Exam ?General: Calm, alert, nondistressed ?Cardiac: Extremities well perfused ?Lungs: Breathing is unlabored ?Psych: No agitation, not responding to internal stimuli ? ?ED Course / MDM  ?EKG:EKG Interpretation ? ?Date/Time:  Sunday February 27 2022 18:08:08 EDT ?Ventricular Rate:  76 ?PR Interval:  164 ?QRS Duration: 104 ?QT Interval:  412 ?QTC Calculation: 463 ?R Axis:   79 ?Text Interpretation: Normal sinus rhythm Septal infarct , age undetermined Abnormal ECG When compared with ECG of 08-Aug-2021 20:05, When compared with ECG of 08/08/2021, No significant change was found Confirmed by Dione Booze ([id number]) on 02/27/2022 11:40:59 PM ? ?I have reviewed the labs performed to date as well as medications administered while in observation.  Recent changes in the last 24 hours include patient presented to the ED 2 days ago for suicidal ideation.  She endorsed a plan to overdose on pills.  She was medically cleared and TTS was consulted.  Inpatient psychiatric admission has been recommended. ? ?Plan  ?Current plan is for inpatient psychiatric admission. ? Dezra FLANNERY CAVALLERO is not under involuntary commitment. ? ? ?  ?Gloris Manchester, MD ?03/01/22 [phone] ? ?

## 2022-03-01 NOTE — ED Notes (Signed)
Placed breakfast order ?

## 2022-03-01 NOTE — Consult Note (Signed)
?  Patient seen, evaluated by me and treatment plan formulated by me.  Patient this morning, reports that she continues to have suicidal thoughts, cannot contract for safety, reports her depression and anxiety on a scale of 0-10, with 0 being no symptoms and 10 being the worst, are about a 9 out of 10.  She states that she cannot keep herself safe, reports that her mother is looking after her grandchildren.  Patient continues to need inpatient psychiatric care for stabilization and treatment ?

## 2022-03-01 NOTE — ED Notes (Signed)
Durwin Glaze (Mother) called asking for an update on Alyssa [name]. Her mother has some questions about how she is doing? Her number is [phone]. ?

## 2022-03-01 NOTE — ED Notes (Addendum)
error 

## 2022-03-02 DIAGNOSIS — F1994 Other psychoactive substance use, unspecified with psychoactive substance-induced mood disorder: Secondary | ICD-10-CM

## 2022-03-02 MED ORDER — GABAPENTIN 100 MG PO CAPS: 200.0000 mg | ORAL_CAPSULE | Freq: Three times a day (TID) | ORAL | 0 refills | Status: AC

## 2022-03-02 MED ORDER — PENICILLIN V POTASSIUM 500 MG PO TABS: 500.0000 mg | ORAL_TABLET | Freq: Four times a day (QID) | ORAL | 0 refills | Status: AC

## 2022-03-02 MED ORDER — ALBUTEROL SULFATE HFA 108 (90 BASE) MCG/ACT IN AERS: 1.0000 | INHALATION_SPRAY | Freq: Four times a day (QID) | RESPIRATORY_TRACT | 0 refills | Status: AC | PRN

## 2022-03-02 NOTE — BH Assessment (Signed)
[facility] Assessment Progress Note ?  ?Per Ophelia Shoulder, NP, this pt does not require psychiatric hospitalization at this time.  Pt is psychiatrically cleared.  Shnese recommends pt for a Partial Hospitalization Program.  This Clinical research associate has reached out to [employer], [place] with the program offer by the [facility].  She reports that pt is not appropriate for their program due to substance use disorder.  Discharge instructions include referral information for the program offer by [facility]  EDP Edwin Dada, DO and pt's nurse, Magda Paganini, have been notified. ? ?Doylene Canning, MA ?Triage Specialist ?[phone] ? ?

## 2022-03-02 NOTE — ED Notes (Addendum)
Per request of Patient [facility] was called. Pt reports she had been offered a out patient treatment POC but she had refused that.Pt now wants Out Patient care. ?

## 2022-03-02 NOTE — ED Notes (Signed)
Pt sister at bed side to transport Pt home. ?

## 2022-03-02 NOTE — ED Notes (Signed)
Pt's mother Durwin Glaze (mother) wants an update on her daughter [phone]. Pt's mother has called several times during the past couple days and no response. Please call when get a chance. ?

## 2022-03-02 NOTE — Consult Note (Addendum)
Telepsych Consultation  ? ?Reason for Consult:  Psychiatric Reassessment for SI ?Referring Physician:  Garald Balding, PA-C ?Location of Patient:   [name] ?Location of Provider: Other: virtual home office ? ?Patient Identification: Alyssa [name] ?MRN:  [id number] ?Principal Diagnosis: Substance induced mood disorder ([place]) ?Diagnosis:  Principal Problem: ?  Substance induced mood disorder ([place]) ?Active Problems: ?  Polysubstance abuse ([place]) ? ? ?Total Time spent with patient: 30 minutes ? ?Subjective:   ?Alyssa [name] is a 46 y.o. female patient admitted with suicidal ideations. ? ?HPI:   ?Patient seen via telepsych by this provider; chart reviewed and consulted with Dr. Dwyane Dee on 03/02/22.  On evaluation Alyssa [name] reports she no longer has suicidal thoughts, plan or intent for self harm.  States she actually felt better on 03/01/2022 but reports she was sleepy when she saw the psychiatrist so did not get a chance to make her aware of this. She does report a history for depression, and states she used cocaine prior to admission; admission UDS was positive for cocaine . Patient states the cocaine exacerbated her concerns and made her more impulsive.  Now that she is no longer acutely intoxicated, has cleared up, she no longer wants to end her life.  She lists her 2 grandchildren as protective factors.  States her grandchildren and her son all live with her.  Her grandchildren's mother is deceased, pt states they depend on her and she would not want to disappoint them by hurting herself.  She states cocaine use is not a problem for her, and declines resources for cessation.   She was previously seen at [facility] for medication management and agrees to return there upon discharge for follow-up.   ? ?Since admission, she's been cooperative with hospital staff and has not required prn medications for behavioral concerns.  She was restarted on olanzapine 5mg  po qhs; also given hydroxyzine 25mg  po q4 hours prn anxiety. Pt  has tolerated medications without concerns. She was initially recommended for inpatient psychiatric admission and accepted at [facility] for transfer today, in lieu of her symptomatic improvement and ability to contract for safety, she no longer meets inpatient criteria.   ? ?Per [name] Provider Admission Assessment 02/27/2022: ?Chief Complaint  ?Patient presents with  ? Suicidal  ?  ?  ?Alyssa [name] is a 46 y.o. female. ?  ?HPI ?46 year old female with a history of hypertension, COPD, bipolar disorder, schizophrenia, fibromyalgia presents to the ER with complaints of suicidal ideation.  Patient states that she plans to overdose of pills.  She states that she has relapsed on her drug use, states that she uses cocaine and opioids.  She also endorses daily alcohol use, states she will drink beer or malt liquor depending on what she "can get her hands on".  She has been hearing voices that have been telling her to hurt herself.  She does state that she has 2 granddaughters that depend on her.  She denies any homicidal ideation.  Denies any visual hallucinations. ?  ? ?Past Psychiatric History: MDD, Anxiety ? ?Risk to Self:  no ?Risk to Others:  no ?Prior Inpatient Therapy:  unknown ?Prior Outpatient Therapy:  yes, [facility] ? ?Past Medical History:  ?Past Medical History:  ?Diagnosis Date  ? Active smoker   ? Anxiety   ? Asthma   ? Bipolar affective ([place])   ? Bipolar disorder ([place])   ? Chronic back pain   ? COPD (chronic obstructive pulmonary disease) ([place])   ?  DJD (degenerative joint disease)   ? Fibromyalgia   ? HLD (hyperlipidemia)   ? HTN (hypertension)   ? ACEI cough   ? Hypertension   ? Low back pain   ? 2 ruptured discs in lower back   ? LVH (left ventricular hypertrophy)   ? echo (7/11) showed EF 70%, mild LVH, no sig valvular disease, moderate pericardial effussion. echo (9/11) showed no pericardial effusion, RF 65%, mild MR, normal RV size and systolic function.   ? Osteoporosis   ? Sleep apnea   ?  ?Past Surgical  History:  ?Procedure Laterality Date  ? bilateral hand surgery    ? CARPAL TUNNEL RELEASE    ? CESAREAN SECTION    ? x3   ? CESAREAN SECTION    ? NOSE SURGERY    ? TUBAL LIGATION    ? tubligation    ? ?Family History:  ?Family History  ?Problem Relation Age of Onset  ? Coronary artery disease Other   ?     family hx  ? ?Family Psychiatric  History: unknown ?Social History:  ?Social History  ? ?Substance and Sexual Activity  ?Alcohol Use Yes  ? Comment: Twice a month.   ?   ?Social History  ? ?Substance and Sexual Activity  ?Drug Use No  ? Comment: used to use cocaine - last use 10/10  ?  ?Social History  ? ?Socioeconomic History  ? Marital status: Divorced  ?  Spouse name: Not on file  ? Number of children: Not on file  ? Years of education: Not on file  ? Highest education level: Not on file  ?Occupational History  ? Not on file  ?Tobacco Use  ? Smoking status: Every Day  ?  Packs/day: 0.50  ?  Types: Cigarettes  ? Smokeless tobacco: Never  ?Substance and Sexual Activity  ? Alcohol use: Yes  ?  Comment: Twice a month.   ? Drug use: No  ?  Comment: used to use cocaine - last use 10/10  ? Sexual activity: Yes  ?  Birth control/protection: Surgical  ?Other Topics Concern  ? Not on file  ?Social History Narrative  ? ** Merged History Encounter **  ?    ? Single; lives in [place] with BF and 3 boys.   ? 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   ? ?Social Determinants of Health  ? ?Financial Resource Strain: Not on file  ?Food Insecurity: Not on file  ?Transportation Needs: Not on file  ?Physical Activity: Not on file  ?Stress: Not on file  ?Social Connections: Not on file  ? ?Additional Social History: ?  ? ?Allergies:  No Known Allergies ? ?Labs: No results found for this or any previous visit (from the past 48 hour(s)). ? ?Medications:  ?Current Facility-Administered Medications  ?Medication Dose Route Frequency Provider Last Rate Last Admin  ? gabapentin (NEURONTIN) capsule 200 mg  200 mg Oral TID Alyssa Lot E, NP    200 mg at 03/01/22 2136  ? hydrOXYzine (ATARAX) tablet 25 mg  25 mg Oral Q4H PRN Lacretia Leigh, MD   25 mg at 03/01/22 2136  ? nicotine (NICODERM CQ - dosed in mg/24 hr) patch 7 mg  7 mg Transdermal Daily Ripley Fraise, MD   7 mg at 03/02/22 1013  ? OLANZapine zydis (ZYPREXA) disintegrating tablet 5 mg  5 mg Oral QHS Rankin, Shuvon B, NP   5 mg at 03/01/22 2136  ? penicillin v potassium (VEETID) tablet 500 mg  500 mg Oral Q6H Tegeler, Gwenyth Allegra, MD   500 mg at 03/02/22 0645  ? ?Current Outpatient Medications  ?Medication Sig Dispense Refill  ? albuterol (VENTOLIN HFA) 108 (90 Base) MCG/ACT inhaler Inhale 1-2 puffs into the lungs every 6 (six) hours as needed for wheezing or shortness of breath. 1 each 0  ? Aspirin-Caffeine (BC FAST PAIN RELIEF ARTHRITIS PO) Take 1 packet by mouth every 4 (four) hours as needed (arthritis pain).    ? ibuprofen (ADVIL) 200 MG tablet Take 800 mg by mouth every 6 (six) hours as needed for moderate pain or headache.    ? cetirizine (ZYRTEC ALLERGY) 10 MG tablet Take 1 tablet (10 mg total) by mouth daily. (Patient not taking: Reported on 02/28/2022) 30 tablet 2  ? ? ?Musculoskeletal: ?Strength & Muscle Tone: within normal limits ?Gait & Station: normal ?Patient leans: N/A ? ? ?Psychiatric Specialty Exam: ? ?Presentation  ?General Appearance: Appropriate for Environment; Casual; Neat ? ?Eye Contact:Good ? ?Speech:Clear and Coherent ? ?Speech Volume:Normal ? ?Handedness:Right ? ? ?Mood and Affect  ?Mood:Anxious ? ?Affect:Appropriate; Congruent ? ? ?Thought Process  ?Thought Processes:Coherent ? ?Descriptions of Associations:Intact ? ?Orientation:Full (Time, Place and Person) ? ?Thought Content:Illusions; Logical ? ?History of Schizophrenia/Schizoaffective disorder:No ? ?Duration of Psychotic Symptoms:No data recorded ?Hallucinations:Hallucinations: None ? ?Ideas of Reference:None ? ?Suicidal Thoughts:Suicidal Thoughts: No ? ?Homicidal Thoughts:Homicidal Thoughts: No ? ? ?Sensorium   ?Memory:Immediate Good; Remote Good ? ?Judgment:Good ? ?Insight:Fair ? ? ?Executive Functions  ?Concentration:Good ? ?Attention Span:Good ? ?Recall:Good ? ?[employer] [place] ? ?Language:Good ? ? ?Psyc

## 2022-03-02 NOTE — ED Notes (Signed)
PT has been seen by TTS and and has been psyh cleared. Pt may be DC home. ?

## 2022-03-02 NOTE — ED Notes (Signed)
Pt resting at this time, appears to be in no acute distress, will obtain vitals when pt is awake ?

## 2022-03-02 NOTE — Discharge Instructions (Addendum)
For your behavioral health needs, you are advised to follow up with a Partial Hospitalization Program (PHP).  These programs meets several hours a day, several days a week.  They include psychiatry/medication management, as well as therapy and case management.  Contact [facility] at your earliest opportunity to ask about enrolling in their program: ? ?     [facility] ?     [phone] ?     [place], [place] [address]  ?     [phone] ? ? ? ?

## 2022-03-24 ENCOUNTER — Ambulatory Visit (HOSPITAL_COMMUNITY): Payer: 59 | Admitting: Clinical

## 2022-03-24 ENCOUNTER — Encounter (HOSPITAL_COMMUNITY): Payer: Self-pay

## 2022-07-06 ENCOUNTER — Other Ambulatory Visit (HOSPITAL_COMMUNITY): Payer: Self-pay | Admitting: Family Medicine

## 2022-07-06 DIAGNOSIS — Z1231 Encounter for screening mammogram for malignant neoplasm of breast: Secondary | ICD-10-CM

## 2022-07-20 ENCOUNTER — Ambulatory Visit (HOSPITAL_COMMUNITY): Payer: 59

## 2023-03-16 ENCOUNTER — Other Ambulatory Visit (HOSPITAL_COMMUNITY): Payer: Self-pay | Admitting: Family Medicine

## 2023-03-16 DIAGNOSIS — Z1231 Encounter for screening mammogram for malignant neoplasm of breast: Secondary | ICD-10-CM

## 2023-07-26 ENCOUNTER — Ambulatory Visit
Admission: EM | Admit: 2023-07-26 | Discharge: 2023-07-26 | Disposition: A | Discharge status: Home / Self Care | Payer: Medicaid Other | Attending: Emergency Medicine | Admitting: Emergency Medicine

## 2023-07-26 ENCOUNTER — Telehealth: Payer: Self-pay | Admitting: Emergency Medicine

## 2023-07-26 ENCOUNTER — Ambulatory Visit (INDEPENDENT_AMBULATORY_CARE_PROVIDER_SITE_OTHER): Payer: Medicaid Other

## 2023-07-26 DIAGNOSIS — M25512 Pain in left shoulder: Secondary | ICD-10-CM | POA: Diagnosis not present

## 2023-07-26 MED ORDER — IBUPROFEN 600 MG PO TABS: 600.0000 mg | ORAL_TABLET | Freq: Four times a day (QID) | ORAL | 0 refills | Status: AC | PRN

## 2023-07-26 NOTE — Telephone Encounter (Signed)
Telephone call from patient requesting medication for pain.  Ibuprofen sent to pharmacy.

## 2023-07-26 NOTE — Discharge Instructions (Addendum)
I will call you with the xray results.    Wear the sling as needed for comfort.    Follow up with an orthopedist such as the one listed below.

## 2023-07-26 NOTE — ED Triage Notes (Signed)
Patient to Urgent Care with complaints of left sided shoulder pain that started 8 days ago. Gradual onset. Denies any known injury.

## 2023-07-26 NOTE — ED Provider Notes (Signed)
[name]    CSN: [id number] Arrival date & time: 07/26/23  1255      History   Chief Complaint Chief Complaint  Patient presents with   Shoulder Pain    HPI Alyssa [name] is a 47 y.o. female.  Patient presents with 8 day history of left shoulder pain.  The pain intermittently radiates to her upper back and down her left arm.  She reports intermittent numbness in her 4th and 5th fingers.  The pain is worse with movement and improves with rest.  No known injury.  No weakness, rash, wounds, redness, bruising, swelling, or other symptoms.  No OTC medications taken.  Her medical history includes COPD, hypertension, bipolar disorder, polysubstance abuse, fibromyalgia, chronic back pain.   The history is provided by the patient and medical records.    Past Medical History:  Diagnosis Date   Active smoker    Anxiety    Asthma    Bipolar affective (HCC)    Bipolar disorder (HCC)    Chronic back pain    COPD (chronic obstructive pulmonary disease) (HCC)    DJD (degenerative joint disease)    Fibromyalgia    HLD (hyperlipidemia)    HTN (hypertension)    ACEI cough    Hypertension    Low back pain    2 ruptured discs in lower back    LVH (left ventricular hypertrophy)    echo (7/11) showed EF 70%, mild LVH, no sig valvular disease, moderate pericardial effussion. echo (9/11) showed no pericardial effusion, RF 65%, mild MR, normal RV size and systolic function.    Osteoporosis    Sleep apnea     Patient Active Problem List   Diagnosis Date Noted   Substance induced mood disorder (HCC) 02/28/2022   Polysubstance abuse (HCC) 02/28/2022   Suicidal ideation    GERD (gastroesophageal reflux disease) 01/04/2018   Chronic back pain 01/04/2018   Irregular menses 01/04/2018   Bipolar affect, depressed (HCC) 06/20/2014   Drug overdose 06/17/2014   Overdose 06/17/2014   Tobacco use disorder 09/07/2010   Sleep apnea 09/07/2010   COUGH 09/07/2010   HYPERLIPIDEMIA  08/09/2010   History of hypertension 08/09/2010   COPD (chronic obstructive pulmonary disease) (HCC) 08/09/2010    Past Surgical History:  Procedure Laterality Date   bilateral hand surgery     CARPAL TUNNEL RELEASE     CESAREAN SECTION     x3    CESAREAN SECTION     NOSE SURGERY     TUBAL LIGATION     tubligation      OB History     Gravida  0   Para  0   Term  0   Preterm  0   AB  0   Living         SAB  0   IAB  0   Ectopic  0   Multiple      Live Births               Home Medications    Prior to Admission medications   Medication Sig Start Date End Date Taking? Authorizing Provider  albuterol (VENTOLIN HFA) 108 (90 Base) MCG/ACT inhaler Inhale 1-2 puffs into the lungs every 6 (six) hours as needed for wheezing or shortness of breath. 03/11/21   Rhys Martini, PA-C  albuterol (VENTOLIN HFA) 108 (90 Base) MCG/ACT inhaler Inhale 1-2 puffs into the lungs every 6 (six) hours as needed for wheezing or shortness  of breath. 03/02/22   Sloan Leiter, DO  Aspirin-Caffeine (BC FAST PAIN RELIEF ARTHRITIS PO) Take 1 packet by mouth every 4 (four) hours as needed (arthritis pain).    [provider]  cetirizine (ZYRTEC ALLERGY) 10 MG tablet Take 1 tablet (10 mg total) by mouth daily. Patient not taking: Reported on 02/28/2022 03/11/21   Rhys Martini, PA-C  gabapentin (NEURONTIN) 100 MG capsule Take 2 capsules (200 mg total) by mouth 3 (three) times daily for 7 days. Patient not taking: Reported on 07/26/2023 03/02/22 03/09/22  Tanda Rockers A, DO  ibuprofen (ADVIL) 200 MG tablet Take 800 mg by mouth every 6 (six) hours as needed for moderate pain or headache.    [provider]  Fluticasone-Salmeterol (ADVAIR DISKUS) 250-50 MCG/DOSE AEPB Inhale 1 puff into the lungs 2 (two) times daily. IM Program Patient not taking: No sig reported 01/04/18 03/11/21  Scherrie Gerlach, MD  omeprazole (PRILOSEC) 40 MG capsule Take 1 capsule (40 mg total) by mouth  daily. IM Program Patient not taking: No sig reported 01/04/18 03/11/21  Scherrie Gerlach, MD    Family History Family History  Problem Relation Age of Onset   Coronary artery disease Other        family hx    Social History Social History   Tobacco Use   Smoking status: Every Day    Current packs/day: 0.50    Types: Cigarettes   Smokeless tobacco: Never  Substance Use Topics   Alcohol use: Yes    Comment: Twice a month.    Drug use: No    Comment: used to use cocaine - last use 10/10     Allergies   Patient has no known allergies.   Review of Systems Review of Systems  Constitutional:  Negative for chills and fever.  Respiratory:  Negative for cough and shortness of breath.   Cardiovascular:  Negative for chest pain and palpitations.  Musculoskeletal:  Positive for arthralgias. Negative for joint swelling.  Skin:  Negative for color change, rash and wound.  Neurological:  Positive for numbness. Negative for weakness.  All other systems reviewed and are negative.    Physical Exam Triage Vital Signs ED Triage Vitals  Encounter Vitals Group     BP      Systolic BP Percentile      Diastolic BP Percentile      Pulse      Resp      Temp      Temp src      SpO2      Weight      Height      Head Circumference      Peak Flow      Pain Score      Pain Loc      Pain Education      Exclude from Growth Chart    No data found.  Updated Vital Signs BP (!) 143/90   Pulse (!) 109   Temp 97.8 F (36.6 C)   Resp 18   SpO2 97%   Visual Acuity Right Eye Distance:   Left Eye Distance:   Bilateral Distance:    Right Eye Near:   Left Eye Near:    Bilateral Near:     Physical Exam Vitals and nursing note reviewed.  Constitutional:      General: She is not in acute distress.    Appearance: She is well-developed. She is obese.  HENT:     Mouth/Throat:  Mouth: Mucous membranes are moist.  Cardiovascular:     Rate and Rhythm: Normal rate and regular  rhythm.     Heart sounds: Normal heart sounds.  Pulmonary:     Effort: Pulmonary effort is normal. No respiratory distress.     Breath sounds: Normal breath sounds.  Musculoskeletal:        General: Tenderness present. No swelling or deformity.     Cervical back: Neck supple.     Comments: Generalized tenderness to palpation of left shoulder; no point tenderness.  ROM limited by discomfort.   Skin:    General: Skin is warm and dry.     Capillary Refill: Capillary refill takes less than 2 seconds.     Findings: No bruising, erythema, lesion or rash.  Neurological:     General: No focal deficit present.     Mental Status: She is alert and oriented to person, place, and time.     Sensory: No sensory deficit.     Motor: No weakness.     Gait: Gait normal.      UC Treatments / Results  Labs (all labs ordered are listed, but only abnormal results are displayed) Labs Reviewed - No data to display  EKG   Radiology DG Shoulder Left  Result Date: 07/26/2023 CLINICAL DATA:  Left shoulder pain. EXAM: LEFT SHOULDER - 2+ VIEW COMPARISON:  None Available. FINDINGS: There is no evidence of fracture or dislocation. Mild acromioclavicular degenerative change. Glenohumeral joint space is maintained. Soft tissues are unremarkable. IMPRESSION: 1. No evidence of acute fracture or joint malalignment. 2. Mild acromioclavicular degenerative change. Electronically Signed   By: Feliberto Harts M.D.   On: 07/26/2023 14:27    Procedures Procedures (including critical care time)  Medications Ordered in UC Medications - No data to display  Initial Impression / Assessment and Plan / UC Course  I have reviewed the triage vital signs and the nursing notes.  Pertinent labs & imaging results that were available during my care of the patient were reviewed by me and considered in my medical decision making (see chart for details).    Left shoulder pain.  Xray negative for acute bony abnormality.   Treating with sling, rest, ice packs, Tylenol or ibuprofen.  Education provided on shoulder pain.  Instructed patient to follow-up with orthopedics if her symptoms are not improving.  Contact information for on-call Ortho provided.  Patient agrees to plan of care.   Final Clinical Impressions(s) / UC Diagnoses   Final diagnoses:  Acute pain of left shoulder     Discharge Instructions      I will call you with the xray results.    Wear the sling as needed for comfort.    Follow up with an orthopedist such as the one listed below.        ED Prescriptions   None    I have reviewed the PDMP during this encounter.   Mickie Bail, NP 07/26/23 1434
